# Patient Record
Sex: Female | Born: 1945 | Race: White | Hispanic: No | Smoking: Never smoker
Health system: Southern US, Community
[De-identification: ages and names within clinical notes are randomized; demographics above are authoritative.]

## PROBLEM LIST (undated history)

## (undated) DIAGNOSIS — C569 Malignant neoplasm of unspecified ovary: Secondary | ICD-10-CM

## (undated) DIAGNOSIS — F419 Anxiety disorder, unspecified: Secondary | ICD-10-CM

## (undated) DIAGNOSIS — T4145XA Adverse effect of unspecified anesthetic, initial encounter: Secondary | ICD-10-CM

## (undated) DIAGNOSIS — M25512 Pain in left shoulder: Secondary | ICD-10-CM

## (undated) DIAGNOSIS — I491 Atrial premature depolarization: Secondary | ICD-10-CM

## (undated) DIAGNOSIS — M810 Age-related osteoporosis without current pathological fracture: Secondary | ICD-10-CM

## (undated) DIAGNOSIS — N390 Urinary tract infection, site not specified: Secondary | ICD-10-CM

## (undated) DIAGNOSIS — A0471 Enterocolitis due to Clostridium difficile, recurrent: Secondary | ICD-10-CM

## (undated) DIAGNOSIS — L989 Disorder of the skin and subcutaneous tissue, unspecified: Secondary | ICD-10-CM

## (undated) DIAGNOSIS — T8859XA Other complications of anesthesia, initial encounter: Secondary | ICD-10-CM

## (undated) DIAGNOSIS — Z524 Kidney donor: Secondary | ICD-10-CM

## (undated) HISTORY — PX: ABDOMINAL HYSTERECTOMY: SHX81

## (undated) HISTORY — DX: Enterocolitis due to Clostridium difficile, recurrent: A04.71

## (undated) HISTORY — DX: Urinary tract infection, site not specified: N39.0

## (undated) HISTORY — PX: SHOULDER ARTHROSCOPY: SHX128

## (undated) HISTORY — PX: HYSTEROSCOPY: SHX211

## (undated) HISTORY — PX: BREAST SURGERY: SHX581

## (undated) HISTORY — PX: COLONOSCOPY: SHX174

## (undated) HISTORY — PX: CERVIX SURGERY: SHX593

## (undated) HISTORY — DX: Malignant neoplasm of unspecified ovary: C56.9

---

## 1998-01-31 HISTORY — PX: NEPHRECTOMY: SHX65

## 1998-02-18 ENCOUNTER — Other Ambulatory Visit: Admission: RE | Admit: 1998-02-18 | Discharge: 1998-02-18 | Payer: Self-pay | Admitting: Obstetrics and Gynecology

## 1999-08-17 ENCOUNTER — Encounter: Admission: RE | Admit: 1999-08-17 | Discharge: 1999-08-17 | Payer: Self-pay | Admitting: Obstetrics and Gynecology

## 1999-08-17 ENCOUNTER — Encounter: Payer: Self-pay | Admitting: Obstetrics and Gynecology

## 1999-09-21 ENCOUNTER — Other Ambulatory Visit: Admission: RE | Admit: 1999-09-21 | Discharge: 1999-09-21 | Payer: Self-pay | Admitting: Obstetrics and Gynecology

## 1999-09-21 ENCOUNTER — Encounter (INDEPENDENT_AMBULATORY_CARE_PROVIDER_SITE_OTHER): Payer: Self-pay

## 1999-11-09 ENCOUNTER — Inpatient Hospital Stay (HOSPITAL_COMMUNITY): Admission: AD | Admit: 1999-11-09 | Discharge: 1999-11-09 | Payer: Self-pay | Admitting: *Deleted

## 1999-11-10 ENCOUNTER — Ambulatory Visit (HOSPITAL_COMMUNITY): Admission: RE | Admit: 1999-11-10 | Discharge: 1999-11-10 | Payer: Self-pay | Admitting: Obstetrics and Gynecology

## 2001-09-27 ENCOUNTER — Ambulatory Visit (HOSPITAL_COMMUNITY): Admission: RE | Admit: 2001-09-27 | Discharge: 2001-09-27 | Payer: Self-pay | Admitting: Dermatology

## 2001-09-27 ENCOUNTER — Encounter: Payer: Self-pay | Admitting: Dermatology

## 2001-11-06 ENCOUNTER — Encounter: Payer: Self-pay | Admitting: Family Medicine

## 2001-11-06 ENCOUNTER — Encounter: Admission: RE | Admit: 2001-11-06 | Discharge: 2001-11-06 | Payer: Self-pay | Admitting: Family Medicine

## 2004-02-13 ENCOUNTER — Emergency Department (HOSPITAL_COMMUNITY): Admission: EM | Admit: 2004-02-13 | Discharge: 2004-02-13 | Payer: Self-pay | Admitting: Emergency Medicine

## 2004-07-23 ENCOUNTER — Ambulatory Visit (HOSPITAL_COMMUNITY): Admission: RE | Admit: 2004-07-23 | Discharge: 2004-07-23 | Payer: Self-pay | Admitting: Dermatology

## 2004-09-21 ENCOUNTER — Ambulatory Visit: Payer: Self-pay | Admitting: Family Medicine

## 2004-09-26 ENCOUNTER — Ambulatory Visit: Payer: Self-pay

## 2006-05-18 ENCOUNTER — Ambulatory Visit: Payer: Self-pay | Admitting: Family Medicine

## 2006-05-18 LAB — CONVERTED CEMR LAB
ALT: 14 units/L (ref 0–40)
Albumin: 4.2 g/dL (ref 3.5–5.2)
Alkaline Phosphatase: 58 units/L (ref 39–117)
Basophils Absolute: 0 10*3/uL (ref 0.0–0.1)
Basophils Relative: 0.2 % (ref 0.0–1.0)
CO2: 31 meq/L (ref 19–32)
Chol/HDL Ratio, serum: 2.3
Cholesterol: 178 mg/dL (ref 0–200)
GFR calc non Af Amer: 54 mL/min
Glomerular Filtration Rate, Af Am: 65 mL/min/{1.73_m2}
Glucose, Bld: 81 mg/dL (ref 70–99)
LDL Cholesterol: 92 mg/dL (ref 0–99)
Monocytes Relative: 7.2 % (ref 3.0–11.0)
Platelets: 290 10*3/uL (ref 150–400)
Potassium: 4 meq/L (ref 3.5–5.1)
RBC: 4.27 M/uL (ref 3.87–5.11)
Sodium: 142 meq/L (ref 135–145)
TSH: 1.62 microintl units/mL (ref 0.35–5.50)
Total Bilirubin: 1 mg/dL (ref 0.3–1.2)
Total Protein: 7.1 g/dL (ref 6.0–8.3)
VLDL: 10 mg/dL (ref 0–40)
WBC: 7.1 10*3/uL (ref 4.5–10.5)

## 2006-06-08 ENCOUNTER — Ambulatory Visit: Payer: Self-pay | Admitting: Internal Medicine

## 2008-12-04 ENCOUNTER — Telehealth (INDEPENDENT_AMBULATORY_CARE_PROVIDER_SITE_OTHER): Payer: Self-pay | Admitting: *Deleted

## 2008-12-04 ENCOUNTER — Ambulatory Visit: Payer: Self-pay | Admitting: Family Medicine

## 2008-12-04 DIAGNOSIS — M545 Low back pain: Secondary | ICD-10-CM

## 2008-12-07 ENCOUNTER — Ambulatory Visit (HOSPITAL_BASED_OUTPATIENT_CLINIC_OR_DEPARTMENT_OTHER): Admission: RE | Admit: 2008-12-07 | Discharge: 2008-12-07 | Payer: Self-pay | Admitting: Family Medicine

## 2008-12-07 ENCOUNTER — Ambulatory Visit: Payer: Self-pay | Admitting: Diagnostic Radiology

## 2008-12-08 ENCOUNTER — Telehealth (INDEPENDENT_AMBULATORY_CARE_PROVIDER_SITE_OTHER): Payer: Self-pay | Admitting: *Deleted

## 2010-11-18 NOTE — Op Note (Signed)
[facility]  Patient:    Colleen Mckinney, Colleen Mckinney                       MRN: [id number] Proc. Date: 11/10/99 Adm. Date:  [id number] Disc. Date: [id number] Attending:  [name]                           Operative Report  PREOPERATIVE DIAGNOSIS:       Persistent postmenopausal bleeding.  POSTOPERATIVE DIAGNOSIS:      Persistent postmenopausal bleeding.  OPERATION:                    Hysteroscopy with endometrial ablation.  SURGEON:                      Katherine Roan, M.D.  ASSISTANT:  ANESTHESIA:  ESTIMATED BLOOD LOSS:  DESCRIPTION OF PROCEDURE:     The patient was placed in the lithotomy position nd prepped and draped in the usual fashion.  The cervix was progressively dilated. A retroverted uterus was noted.  Pitressin solution was injected into the uterus n a retroverted fashion.  Hysteroscopy was performed which revealed a smooth endometrial cavity and using the rollerball vaportrobe at 200 watts of power, a  thorough endometrial ablation was obtained.  No unusual blood loss occurred during the procedure.  No specimens were taken because an endometrial biopsy had previously been done and was normal.  [name] tolerated the procedure well and was sent to the recovery room in good condition. DD:  11/10/99 TD:  11/11/99 Job: [id number]

## 2011-04-26 ENCOUNTER — Encounter: Payer: Self-pay | Admitting: *Deleted

## 2011-04-27 ENCOUNTER — Other Ambulatory Visit (INDEPENDENT_AMBULATORY_CARE_PROVIDER_SITE_OTHER): Payer: Medicare Other

## 2011-04-27 ENCOUNTER — Ambulatory Visit (INDEPENDENT_AMBULATORY_CARE_PROVIDER_SITE_OTHER): Payer: Self-pay | Admitting: Family Medicine

## 2011-04-27 ENCOUNTER — Other Ambulatory Visit: Payer: Self-pay | Admitting: Family Medicine

## 2011-04-27 DIAGNOSIS — Z0000 Encounter for general adult medical examination without abnormal findings: Secondary | ICD-10-CM

## 2011-04-27 DIAGNOSIS — Z79899 Other long term (current) drug therapy: Secondary | ICD-10-CM

## 2011-04-27 LAB — CBC WITH DIFFERENTIAL/PLATELET
Basophils Absolute: 0 10*3/uL (ref 0.0–0.1)
Basophils Relative: 0.7 % (ref 0.0–3.0)
Eosinophils Relative: 1.8 % (ref 0.0–5.0)
HCT: 37.7 % (ref 36.0–46.0)
Hemoglobin: 13 g/dL (ref 12.0–15.0)
Lymphocytes Relative: 20.7 % (ref 12.0–46.0)
Lymphs Abs: 1.5 10*3/uL (ref 0.7–4.0)
Monocytes Relative: 6.5 % (ref 3.0–12.0)
Neutro Abs: 5.1 10*3/uL (ref 1.4–7.7)
RBC: 4.12 Mil/uL (ref 3.87–5.11)
WBC: 7.2 10*3/uL (ref 4.5–10.5)

## 2011-04-27 LAB — BASIC METABOLIC PANEL
BUN: 16 mg/dL (ref 6–23)
Chloride: 104 mEq/L (ref 96–112)
Creatinine, Ser: 1 mg/dL (ref 0.4–1.2)
GFR: 57 mL/min — ABNORMAL LOW (ref >60.00)
Potassium: 4 mEq/L (ref 3.5–5.1)

## 2011-04-27 LAB — HEPATIC FUNCTION PANEL
ALT: 16 U/L (ref 0–35)
AST: 23 U/L (ref 0–37)
Albumin: 4.5 g/dL (ref 3.5–5.2)
Alkaline Phosphatase: 66 U/L (ref 39–117)
Total Protein: 7.5 g/dL (ref 6.0–8.3)

## 2011-04-27 LAB — POCT URINALYSIS DIPSTICK
Leukocytes, UA: NEGATIVE
Nitrite, UA: NEGATIVE
Protein, UA: NEGATIVE
pH, UA: 5

## 2011-04-27 LAB — LIPID PANEL
Cholesterol: 180 mg/dL (ref 0–200)
VLDL: 11 mg/dL (ref 0.0–40.0)

## 2011-04-27 NOTE — Progress Notes (Signed)
  Subjective:    Patient ID: Colleen Mckinney, female    DOB: May 19, 1946, 65 y.o.   MRN: [id number]  HPI  Ov re scheduled    Review of Systems     Objective:   Physical Exam        Assessment & Plan:

## 2011-04-27 NOTE — Progress Notes (Signed)
Labs only

## 2011-05-02 ENCOUNTER — Ambulatory Visit: Payer: Medicare Other | Admitting: Family Medicine

## 2011-05-17 ENCOUNTER — Other Ambulatory Visit: Payer: Self-pay | Admitting: Specialist

## 2011-05-17 DIAGNOSIS — M25512 Pain in left shoulder: Secondary | ICD-10-CM

## 2011-05-18 ENCOUNTER — Ambulatory Visit
Admission: RE | Admit: 2011-05-18 | Discharge: 2011-05-18 | Disposition: A | Discharge status: Home / Self Care | Payer: Medicare Other | Source: Ambulatory Visit | Attending: Specialist | Admitting: Specialist

## 2011-05-18 DIAGNOSIS — M25512 Pain in left shoulder: Secondary | ICD-10-CM

## 2011-05-23 ENCOUNTER — Ambulatory Visit: Payer: Medicare Other | Attending: Specialist | Admitting: Physical Therapy

## 2011-05-23 ENCOUNTER — Ambulatory Visit (INDEPENDENT_AMBULATORY_CARE_PROVIDER_SITE_OTHER): Payer: Medicare Other | Admitting: Family Medicine

## 2011-05-23 ENCOUNTER — Encounter: Payer: Self-pay | Admitting: Family Medicine

## 2011-05-23 DIAGNOSIS — R079 Chest pain, unspecified: Secondary | ICD-10-CM

## 2011-05-23 DIAGNOSIS — M25619 Stiffness of unspecified shoulder, not elsewhere classified: Secondary | ICD-10-CM | POA: Insufficient documentation

## 2011-05-23 DIAGNOSIS — Z136 Encounter for screening for cardiovascular disorders: Secondary | ICD-10-CM

## 2011-05-23 DIAGNOSIS — Z23 Encounter for immunization: Secondary | ICD-10-CM

## 2011-05-23 DIAGNOSIS — M25519 Pain in unspecified shoulder: Secondary | ICD-10-CM | POA: Insufficient documentation

## 2011-05-23 DIAGNOSIS — I728 Aneurysm of other specified arteries: Secondary | ICD-10-CM

## 2011-05-23 DIAGNOSIS — Z0000 Encounter for general adult medical examination without abnormal findings: Secondary | ICD-10-CM

## 2011-05-23 DIAGNOSIS — IMO0001 Reserved for inherently not codable concepts without codable children: Secondary | ICD-10-CM | POA: Insufficient documentation

## 2011-05-23 DIAGNOSIS — R1011 Right upper quadrant pain: Secondary | ICD-10-CM

## 2011-05-23 NOTE — Progress Notes (Signed)
Subjective:     Colleen Mckinney is a 65 y.o. female and is here for a comprehensive physical exam. The patient reports problems - frozen shoulder---pt is seeing ortho for this..  History   Social History  . Marital Status: Divorced    Spouse Name: N/A    Number of Children: N/A  . Years of Education: N/A   Occupational History  . retired-- Printmaker    Social History Main Topics  . Smoking status: Never Smoker   . Smokeless tobacco: Not on file  . Alcohol Use: 1.8 oz/week    3 Glasses of wine per week  . Drug Use: No  . Sexually Active: Not Currently -- Female partner(s)   Other Topics Concern  . Not on file   Social History Narrative  . No narrative on file   Health Maintenance  Topic Date Due  . Tetanus/tdap  06/09/1964  . Pap Smear  08/23/2011  . Influenza Vaccine  04/02/2012  . Colonoscopy  05/22/2012  . Mammogram  08/22/2012  . Pneumococcal Polysaccharide Vaccine Age 16 And Over  Addressed  . Zostavax  Completed    The following portions of the patient's history were reviewed and updated as appropriate: allergies, current medications, past family history, past medical history, past social history, past surgical history and problem list.  Review of Systems Review of Systems  Constitutional: Negative for activity change, appetite change and fatigue.  HENT: Negative for hearing loss, congestion, tinnitus and ear discharge.  dentist q59m Eyes: Negative for visual disturbance (see optho q1y -- vision corrected to 20/20 with glasses).  Respiratory: Negative for cough, chest tightness and shortness of breath.   Cardiovascular: Negative for, palpitations and leg swelling.  + chest pain and l neck pain Gastrointestinal: Negative for abdominal pain, diarrhea, constipation and abdominal distention.  Genitourinary: Negative for urgency, frequency, decreased urine volume and difficulty urinating.  Musculoskeletal: Negative for back pain, and gait problem. --+ L shoulder  pain  Skin: Negative for color change, pallor and rash.  Neurological: Negative for dizziness, light-headedness, numbness and headaches.  Hematological: Negative for adenopathy. Does not bruise/bleed easily.  Psychiatric/Behavioral: Negative for suicidal ideas, confusion, sleep disturbance, self-injury, dysphoric mood, decreased concentration and agitation.       Objective:    BP 100/76  Pulse 69  Temp(Src) 98.6 F (37 C) (Oral)  Resp 20  Ht 5' 3.5" (1.613 m)  Wt 114 lb (51.71 kg)  BMI 19.88 kg/m2  SpO2 98% General appearance: alert, cooperative, appears stated age and no distress Head: Normocephalic, without obvious abnormality, atraumatic Eyes: conjunctivae/corneas clear. PERRL, EOM's intact. Fundi benign. Ears: normal TM's and external ear canals both ears Nose: Nares normal. Septum midline. Mucosa normal. No drainage or sinus tenderness. Throat: lips, mucosa, and tongue normal; teeth and gums normal Neck: no adenopathy, no carotid bruit, no JVD, supple, symmetrical, trachea midline and thyroid not enlarged, symmetric, no tenderness/mass/nodules Back: symmetric, no curvature. ROM normal. No CVA tenderness. Lungs: clear to auscultation bilaterally Breasts: gyn Heart: regular rate and rhythm, S1, S2 normal, no murmur, click, rub or gallop Abdomen: soft, non-tender; bowel sounds normal; no masses,  no organomegaly Pelvic: gyn Extremities: extremities normal, atraumatic, no cyanosis or edema--pain L shoulder --  Dec rom Pulses: 2+ and symmetric Skin: Skin color, texture, turgor normal. No rashes or lesions Lymph nodes: Cervical, supraclavicular, and axillary nodes normal. Neurologic: Alert and oriented X 3, normal strength and tone. Normal symmetric reflexes. Normal coordination and gait Psych--no depression and anxiety  Assessment:    Healthy female exam.     frozen L shoulder---f/u ortho L neck / chest pain----  Probably from shoulder----if occurs again go to ER or f/u  here Plan:  ghm utd Check labs   See After Visit Summary for Counseling Recommendations

## 2011-05-23 NOTE — Patient Instructions (Signed)

## 2011-05-24 ENCOUNTER — Other Ambulatory Visit: Payer: Self-pay | Admitting: Family Medicine

## 2011-05-24 DIAGNOSIS — I728 Aneurysm of other specified arteries: Secondary | ICD-10-CM

## 2011-05-24 DIAGNOSIS — R1011 Right upper quadrant pain: Secondary | ICD-10-CM

## 2011-05-24 NOTE — Progress Notes (Signed)
Addended by: Arnette Norris on: 05/24/2011 02:52 PM   Modules accepted: Orders

## 2011-05-29 ENCOUNTER — Other Ambulatory Visit: Payer: Self-pay | Admitting: Family Medicine

## 2011-05-29 ENCOUNTER — Ambulatory Visit: Payer: Medicare Other | Admitting: Physical Therapy

## 2011-05-29 ENCOUNTER — Other Ambulatory Visit: Payer: Self-pay

## 2011-05-29 DIAGNOSIS — I728 Aneurysm of other specified arteries: Secondary | ICD-10-CM

## 2011-05-29 DIAGNOSIS — R1011 Right upper quadrant pain: Secondary | ICD-10-CM

## 2011-05-30 ENCOUNTER — Other Ambulatory Visit: Payer: Medicare Other

## 2011-05-30 ENCOUNTER — Ambulatory Visit (HOSPITAL_BASED_OUTPATIENT_CLINIC_OR_DEPARTMENT_OTHER)
Admission: RE | Admit: 2011-05-30 | Discharge: 2011-05-30 | Disposition: A | Discharge status: Home / Self Care | Payer: Medicare Other | Source: Ambulatory Visit | Attending: Family Medicine | Admitting: Family Medicine

## 2011-05-30 DIAGNOSIS — R1011 Right upper quadrant pain: Secondary | ICD-10-CM | POA: Insufficient documentation

## 2011-05-30 DIAGNOSIS — Z905 Acquired absence of kidney: Secondary | ICD-10-CM | POA: Insufficient documentation

## 2011-05-30 DIAGNOSIS — I728 Aneurysm of other specified arteries: Secondary | ICD-10-CM | POA: Insufficient documentation

## 2011-05-30 MED ORDER — IOHEXOL 350 MG/ML SOLN
100.0000 mL | Freq: Once | INTRAVENOUS | Status: AC | PRN
  Administered 2011-05-30: 100 mL via INTRAVENOUS

## 2011-06-01 ENCOUNTER — Ambulatory Visit: Payer: Medicare Other | Admitting: Physical Therapy

## 2011-06-05 ENCOUNTER — Ambulatory Visit: Payer: Medicare Other | Attending: Specialist | Admitting: Physical Therapy

## 2011-06-05 DIAGNOSIS — M25619 Stiffness of unspecified shoulder, not elsewhere classified: Secondary | ICD-10-CM | POA: Insufficient documentation

## 2011-06-05 DIAGNOSIS — IMO0001 Reserved for inherently not codable concepts without codable children: Secondary | ICD-10-CM | POA: Insufficient documentation

## 2011-06-05 DIAGNOSIS — M25519 Pain in unspecified shoulder: Secondary | ICD-10-CM | POA: Insufficient documentation

## 2011-06-07 ENCOUNTER — Ambulatory Visit: Payer: Medicare Other | Admitting: Physical Therapy

## 2011-06-14 ENCOUNTER — Ambulatory Visit: Payer: Medicare Other | Admitting: Physical Therapy

## 2011-06-22 ENCOUNTER — Telehealth: Payer: Self-pay | Admitting: Family Medicine

## 2011-06-23 NOTE — Telephone Encounter (Signed)
Patient stated "nothing acute" does not help her, she wanted to know if the aneurysm was gone and wanted to know if she had gallstones. Please advise    KP

## 2011-06-23 NOTE — Telephone Encounter (Signed)
No gallstones.  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  They either remain stable or enlarge.  Based on report they feel aneurysm is stable and not at risk of rupture.  Again, will need to wait on Dr Laury Axon to make recommendations

## 2011-06-23 NOTE — Telephone Encounter (Signed)
Discussed with patient and she voiced understanding. She has been advised to follow up as needed.   KP

## 2011-07-11 ENCOUNTER — Telehealth: Payer: Self-pay | Admitting: Family Medicine

## 2011-07-11 NOTE — Telephone Encounter (Signed)
Discussed with patient and se stated she is being charged 50 for the EKG and 10 for the Office Visit. She sated the insurance company will not pay for it because it is not coded as a physical exam. I advised patient the code that was used id the CPE code, she wanted someone to look further into it and give her a call back, the bill is due on the 15th of this month.     KP

## 2011-07-11 NOTE — Telephone Encounter (Signed)
It was a physical---that's how I coded it

## 2011-07-11 NOTE — Telephone Encounter (Signed)
Patient states that she came in 05/23/11 for a physical. She states that her insurance applied visit to deductible stating that her visit was an OV instead of a physical. She wants to know if we can code the visit differently?

## 2011-07-11 NOTE — Telephone Encounter (Signed)
Please advise      KP 

## 2011-07-12 NOTE — Telephone Encounter (Signed)
Colleen Mckinney will it be possible to take care of this because the patient does not want to have to pay this bill. Please advise   Kp

## 2011-07-12 NOTE — Telephone Encounter (Signed)
It may be that cpe was not the code used for the EKG---that can be changed but Colleen Mckinney will need to talk to billing

## 2011-07-18 NOTE — Telephone Encounter (Signed)
Sent to [employer] in [place] billing to advise and make corrections.  Will call patient and advise as well.

## 2011-07-31 ENCOUNTER — Other Ambulatory Visit: Payer: Self-pay | Admitting: Otolaryngology

## 2011-08-11 ENCOUNTER — Encounter (HOSPITAL_COMMUNITY): Payer: Self-pay | Admitting: Pharmacy Technician

## 2011-08-14 ENCOUNTER — Encounter (HOSPITAL_COMMUNITY)
Admission: RE | Admit: 2011-08-14 | Discharge: 2011-08-14 | Disposition: A | Discharge status: Home / Self Care | Payer: Medicare Other | Source: Ambulatory Visit | Attending: Specialist | Admitting: Specialist

## 2011-08-14 ENCOUNTER — Encounter (HOSPITAL_COMMUNITY): Payer: Self-pay

## 2011-08-14 HISTORY — DX: Other complications of anesthesia, initial encounter: T88.59XA

## 2011-08-14 HISTORY — DX: Kidney donor: Z52.4

## 2011-08-14 HISTORY — DX: Pain in left shoulder: M25.512

## 2011-08-14 HISTORY — DX: Adverse effect of unspecified anesthetic, initial encounter: T41.45XA

## 2011-08-14 HISTORY — DX: Anxiety disorder, unspecified: F41.9

## 2011-08-14 LAB — CBC
Hemoglobin: 14.1 g/dL (ref 12.0–15.0)
MCH: 30.9 pg (ref 26.0–34.0)
MCHC: 35.3 g/dL (ref 30.0–36.0)
MCV: 87.3 fL (ref 78.0–100.0)
RBC: 4.57 MIL/uL (ref 3.87–5.11)

## 2011-08-14 LAB — BASIC METABOLIC PANEL
BUN: 19 mg/dL (ref 6–23)
CO2: 30 mEq/L (ref 19–32)
Calcium: 10.3 mg/dL (ref 8.4–10.5)
Creatinine, Ser: 0.97 mg/dL (ref 0.50–1.10)
Glucose, Bld: 92 mg/dL (ref 70–99)

## 2011-08-14 NOTE — Pre-Procedure Instructions (Signed)
CXR AND EKG NOT NEEDED PER ANESTHESIOLOGIST'S GUIDELINES--PT DID HAVE EKG DONE 05/23/11 BY DR. Marylene Buerger IN EPIC AND ON THIS CHART. CBC AND BMET WERE DONE TODAY-PREOP AT [facility].

## 2011-08-14 NOTE — Patient Instructions (Signed)
20 Colleen Mckinney  08/14/2011   Your procedure is scheduled on:  *THURSDAY 2/14  AT 7:30 AM  Report to [facility] at 5:30 AM.  Call this number if you have problems the morning of surgery: [phone]   Remember:   Do not eat food OR DRINK ANYTHING AFTER MIDNIGHT THE NIGHT BEFORE YOUR SURGERY    Take these medicines the morning of surgery with A SIP OF WATER: NO MEDICINES TO TAKE   Do not wear jewelry, make-up or nail polish.  Do not wear lotions, powders, or perfumes.   Do not shave 48 hours prior to surgery.  Do not bring valuables to the hospital.  Contacts, dentures or bridgework may not be worn into surgery.  Leave suitcase in the car. After surgery it may be brought to your room.  For patients admitted to the hospital, checkout time is 11:00 AM the day of discharge.   Patients discharged the day of surgery will not be allowed to drive home.    Special Instructions: CHG Shower Use Special Wash: 1/2 bottle night before surgery and 1/2 bottle morning of surgery.   Please read over the following fact sheets that you were given: MRSA Information AND INCENTIVE SPIROMETER INFORMATION

## 2011-08-17 ENCOUNTER — Encounter (HOSPITAL_COMMUNITY): Payer: Self-pay | Admitting: Anesthesiology

## 2011-08-17 ENCOUNTER — Encounter (HOSPITAL_COMMUNITY): Payer: Self-pay

## 2011-08-17 ENCOUNTER — Encounter (HOSPITAL_COMMUNITY)
Admission: RE | Disposition: A | Discharge status: Home / Self Care | Payer: Self-pay | Source: Ambulatory Visit | Attending: Specialist

## 2011-08-17 ENCOUNTER — Ambulatory Visit (HOSPITAL_COMMUNITY)
Admission: RE | Admit: 2011-08-17 | Discharge: 2011-08-17 | Disposition: A | Discharge status: Home / Self Care | Payer: Medicare Other | Source: Ambulatory Visit | Attending: Specialist | Admitting: Specialist

## 2011-08-17 ENCOUNTER — Inpatient Hospital Stay (HOSPITAL_COMMUNITY): Payer: Medicare Other | Admitting: Anesthesiology

## 2011-08-17 DIAGNOSIS — M7500 Adhesive capsulitis of unspecified shoulder: Secondary | ICD-10-CM | POA: Insufficient documentation

## 2011-08-17 DIAGNOSIS — M719 Bursopathy, unspecified: Secondary | ICD-10-CM | POA: Insufficient documentation

## 2011-08-17 DIAGNOSIS — M24119 Other articular cartilage disorders, unspecified shoulder: Secondary | ICD-10-CM | POA: Insufficient documentation

## 2011-08-17 DIAGNOSIS — Z905 Acquired absence of kidney: Secondary | ICD-10-CM | POA: Insufficient documentation

## 2011-08-17 DIAGNOSIS — Z01812 Encounter for preprocedural laboratory examination: Secondary | ICD-10-CM | POA: Insufficient documentation

## 2011-08-17 DIAGNOSIS — M67919 Unspecified disorder of synovium and tendon, unspecified shoulder: Secondary | ICD-10-CM | POA: Insufficient documentation

## 2011-08-17 DIAGNOSIS — M25819 Other specified joint disorders, unspecified shoulder: Secondary | ICD-10-CM | POA: Insufficient documentation

## 2011-08-17 DIAGNOSIS — M7540 Impingement syndrome of unspecified shoulder: Secondary | ICD-10-CM

## 2011-08-17 SURGERY — SHOULDER ARTHROSCOPY WITH ROTATOR CUFF REPAIR AND SUBACROMIAL DECOMPRESSION
Anesthesia: General | Site: Shoulder | Laterality: Left | Wound class: Clean

## 2011-08-17 MED ORDER — HYDROCODONE-ACETAMINOPHEN 5-325 MG PO TABS: 1.0000 | ORAL_TABLET | Freq: Four times a day (QID) | ORAL | Status: AC | PRN

## 2011-08-17 MED ORDER — PHENYLEPHRINE HCL 10 MG/ML IJ SOLN
INTRAMUSCULAR | Status: DC | PRN
  Administered 2011-08-17: 80 ug via INTRAVENOUS
  Administered 2011-08-17: 120 ug via INTRAVENOUS
  Administered 2011-08-17 (×2): 80 ug via INTRAVENOUS
  Administered 2011-08-17: 120 ug via INTRAVENOUS

## 2011-08-17 MED ORDER — PROPOFOL 10 MG/ML IV EMUL
INTRAVENOUS | Status: DC | PRN
  Administered 2011-08-17: 120 mg via INTRAVENOUS

## 2011-08-17 MED ORDER — ONDANSETRON HCL 4 MG/2ML IJ SOLN
INTRAMUSCULAR | Status: DC | PRN
  Administered 2011-08-17: 4 mg via INTRAVENOUS

## 2011-08-17 MED ORDER — EPINEPHRINE HCL 1 MG/ML IJ SOLN
INTRAMUSCULAR | Status: DC | PRN
  Administered 2011-08-17: 1 mg via INTRAVENOUS

## 2011-08-17 MED ORDER — LACTATED RINGERS IV SOLN
INTRAVENOUS | Status: DC
  Administered 2011-08-17 (×2): via INTRAVENOUS

## 2011-08-17 MED ORDER — KETOROLAC TROMETHAMINE 15 MG/ML IJ SOLN: 15.0000 mg | Freq: Four times a day (QID) | INTRAMUSCULAR | Status: DC | PRN

## 2011-08-17 MED ORDER — LACTATED RINGERS IR SOLN
Status: DC | PRN
  Administered 2011-08-17: 3000 mL

## 2011-08-17 MED ORDER — PROMETHAZINE HCL 25 MG/ML IJ SOLN: 6.2500 mg | INTRAMUSCULAR | Status: DC | PRN

## 2011-08-17 MED ORDER — EPHEDRINE SULFATE 50 MG/ML IJ SOLN
INTRAMUSCULAR | Status: DC | PRN
  Administered 2011-08-17: 10 mg via INTRAVENOUS

## 2011-08-17 MED ORDER — HYDROCODONE-ACETAMINOPHEN 5-325 MG PO TABS: 1.0000 | ORAL_TABLET | ORAL | Status: DC | PRN

## 2011-08-17 MED ORDER — LIDOCAINE HCL (CARDIAC) 20 MG/ML IV SOLN
INTRAVENOUS | Status: DC | PRN
  Administered 2011-08-17: 80 mg via INTRAVENOUS

## 2011-08-17 MED ORDER — CHLORHEXIDINE GLUCONATE 4 % EX LIQD: 60.0000 mL | Freq: Once | CUTANEOUS | Status: DC

## 2011-08-17 MED ORDER — METHOCARBAMOL 500 MG PO TABS: 500.0000 mg | ORAL_TABLET | Freq: Four times a day (QID) | ORAL | Status: DC | PRN

## 2011-08-17 MED ORDER — BUPIVACAINE-EPINEPHRINE 0.5% -1:200000 IJ SOLN
INTRAMUSCULAR | Status: DC | PRN
  Administered 2011-08-17: 30 mL

## 2011-08-17 MED ORDER — METHOCARBAMOL 500 MG PO TABS: 500.0000 mg | ORAL_TABLET | Freq: Four times a day (QID) | ORAL | Status: AC | PRN

## 2011-08-17 MED ORDER — CEFAZOLIN SODIUM 1-5 GM-% IV SOLN
1.0000 g | INTRAVENOUS | Status: AC
  Administered 2011-08-17: 1 g via INTRAVENOUS

## 2011-08-17 MED ORDER — MIDAZOLAM HCL 5 MG/5ML IJ SOLN
INTRAMUSCULAR | Status: DC | PRN
  Administered 2011-08-17: 2 mg via INTRAVENOUS

## 2011-08-17 MED ORDER — ROPIVACAINE HCL 5 MG/ML IJ SOLN
INTRAMUSCULAR | Status: DC | PRN
  Administered 2011-08-17: 30 mL

## 2011-08-17 MED ORDER — FENTANYL CITRATE 0.05 MG/ML IJ SOLN
INTRAMUSCULAR | Status: DC | PRN
  Administered 2011-08-17: 100 ug via INTRAVENOUS
  Administered 2011-08-17: 50 ug via INTRAVENOUS

## 2011-08-17 MED ORDER — ROCURONIUM BROMIDE 100 MG/10ML IV SOLN
INTRAVENOUS | Status: DC | PRN
  Administered 2011-08-17: 50 mg via INTRAVENOUS

## 2011-08-17 MED ORDER — NEOSTIGMINE METHYLSULFATE 1 MG/ML IJ SOLN
INTRAMUSCULAR | Status: DC | PRN
  Administered 2011-08-17: 3 mg via INTRAVENOUS

## 2011-08-17 MED ORDER — ACETAMINOPHEN 10 MG/ML IV SOLN
INTRAVENOUS | Status: DC | PRN
  Administered 2011-08-17: 1000 mg via INTRAVENOUS

## 2011-08-17 MED ORDER — HYDROMORPHONE HCL PF 1 MG/ML IJ SOLN: 0.2500 mg | INTRAMUSCULAR | Status: DC | PRN

## 2011-08-17 MED ORDER — GLYCOPYRROLATE 0.2 MG/ML IJ SOLN
INTRAMUSCULAR | Status: DC | PRN
  Administered 2011-08-17: .4 mg via INTRAVENOUS

## 2011-08-17 SURGICAL SUPPLY — 40 items
BLADE CUTTER GATOR 3.5 (BLADE) ×2 IMPLANT
BLADE SURG SZ11 CARB STEEL (BLADE) ×2 IMPLANT
BUR OVAL 4.0 (BURR) ×2 IMPLANT
CANNULA ACUFO 5X76 (CANNULA) ×2 IMPLANT
CHLORAPREP W/TINT 26ML (MISCELLANEOUS) IMPLANT
CLOTH BEACON ORANGE TIMEOUT ST (SAFETY) ×2 IMPLANT
DECANTER SPIKE VIAL GLASS SM (MISCELLANEOUS) ×2 IMPLANT
DRAPE ORTHO SPLIT 77X108 STRL (DRAPES)
DRAPE SURG ORHT 6 SPLT 77X108 (DRAPES) IMPLANT
DRAPE U-SHAPE 47X51 STRL (DRAPES) ×2 IMPLANT
DRSG EMULSION OIL 3X3 NADH (GAUZE/BANDAGES/DRESSINGS) ×2 IMPLANT
DRSG PAD ABDOMINAL 8X10 ST (GAUZE/BANDAGES/DRESSINGS) ×1 IMPLANT
DURAPREP 26ML APPLICATOR (WOUND CARE) ×2 IMPLANT
GAUZE SPONGE 4X4 12PLY STRL LF (GAUZE/BANDAGES/DRESSINGS) ×1 IMPLANT
GLOVE BIO SURGEON STRL SZ 6.5 (GLOVE) ×2 IMPLANT
GLOVE BIOGEL PI IND STRL 6.5 (GLOVE) ×1 IMPLANT
GLOVE BIOGEL PI IND STRL 8 (GLOVE) ×1 IMPLANT
GLOVE BIOGEL PI INDICATOR 6.5 (GLOVE) ×1
GLOVE BIOGEL PI INDICATOR 8 (GLOVE) ×1
GLOVE ECLIPSE 6.5 STRL STRAW (GLOVE) ×2 IMPLANT
GLOVE SURG SS PI 8.0 STRL IVOR (GLOVE) ×4 IMPLANT
GOWN PREVENTION PLUS LG XLONG (DISPOSABLE) ×2 IMPLANT
GOWN PREVENTION PLUS XLARGE (GOWN DISPOSABLE) ×2 IMPLANT
GOWN STRL REIN XL XLG (GOWN DISPOSABLE) ×2 IMPLANT
KIT BASIN OR (CUSTOM PROCEDURE TRAY) ×2 IMPLANT
MANIFOLD NEPTUNE II (INSTRUMENTS) ×4 IMPLANT
NEEDLE SPNL 18GX3.5 QUINCKE PK (NEEDLE) ×2 IMPLANT
PACK SHOULDER CUSTOM OPM052 (CUSTOM PROCEDURE TRAY) ×2 IMPLANT
POSITIONER SURGICAL ARM (MISCELLANEOUS) ×2 IMPLANT
SET ARTHROSCOPY TUBING (MISCELLANEOUS) ×2
SET ARTHROSCOPY TUBING LN (MISCELLANEOUS) ×1 IMPLANT
SLING ARM IMMOBILIZER LRG (SOFTGOODS) IMPLANT
SLING ARM IMMOBILIZER MED (SOFTGOODS) ×2 IMPLANT
SLING ULTRA II S (ORTHOPEDIC SUPPLIES) ×1 IMPLANT
STRAP CHIN BCCS-OSI (MISCELLANEOUS) ×2 IMPLANT
SUT ETHILON 4 0 PS 2 18 (SUTURE) ×2 IMPLANT
SUT VIC AB 2-0 CT2 27 (SUTURE) IMPLANT
SUT VICRYL 0-0 OS 2 NEEDLE (SUTURE) ×2 IMPLANT
TAPE HYPAFIX 4 X10 (GAUZE/BANDAGES/DRESSINGS) ×1 IMPLANT
TUBING CONNECTING 10 (TUBING) ×2 IMPLANT

## 2011-08-17 NOTE — Anesthesia Preprocedure Evaluation (Addendum)
Anesthesia Evaluation  Patient identified by MRN, date of birth, ID band Patient awake    Reviewed: Allergy & Precautions, H&P , NPO status , Patient's Chart, lab work & pertinent test results  Airway Mallampati: II TM Distance: >3 FB Neck ROM: Full    Dental No notable dental hx.    Pulmonary neg pulmonary ROS,  clear to auscultation  Pulmonary exam normal       Cardiovascular neg cardio ROS Regular Normal    Neuro/Psych Anxiety Negative Neurological ROS     GI/Hepatic negative GI ROS, Neg liver ROS,   Endo/Other  Negative Endocrine ROS  Renal/GU negative Renal ROS  Genitourinary negative   Musculoskeletal negative musculoskeletal ROS (+)   Abdominal   Peds negative pediatric ROS (+)  Hematology negative hematology ROS (+)   Anesthesia Other Findings   Reproductive/Obstetrics negative OB ROS                           Anesthesia Physical Anesthesia Plan  ASA: II  Anesthesia Plan: General   Post-op Pain Management:    Induction: Intravenous  Airway Management Planned: Oral ETT  Additional Equipment:   Intra-op Plan:   Post-operative Plan: Extubation in OR  Informed Consent: I have reviewed the patients History and Physical, chart, labs and discussed the procedure including the risks, benefits and alternatives for the proposed anesthesia with the patient or authorized representative who has indicated his/her understanding and acceptance.   Dental advisory given  Plan Discussed with: CRNA  Anesthesia Plan Comments: (Discussed risks and benefits of interscalene block including failure, bleeding, infection, nerve damage, weakness. Questions answered. Patient consents to block. )       Anesthesia Quick Evaluation

## 2011-08-17 NOTE — Anesthesia Procedure Notes (Signed)
Anesthesia Regional Block:  Interscalene brachial plexus block  Pre-Anesthetic Checklist: ,, timeout performed, Correct Patient, Correct Site, Correct Laterality, Correct Procedure, Correct Position, site marked, Risks and benefits discussed,  Surgical consent,  Pre-op evaluation,  At surgeon's request and post-op pain management  Laterality: Left  Prep: chloraprep       Needles:  Injection technique: Single-shot  Needle Type: Stimiplex      Needle Gauge: 21 G    Additional Needles:  Procedures: ultrasound guided and nerve stimulator Interscalene brachial plexus block  Nerve Stimulator or Paresthesia:  Response: 0.5 mA,   Additional Responses:   Narrative:  Injection made incrementally with aspirations every 5 mL.  Performed by: Personally  Anesthesiologist: Chesni Vos  Additional Notes: Risks, benefits and alternative to block explained extensively.  Patient tolerated procedure well, without complications. No pain on injection. No increased resistance to injection.  Motor intact immediately after block. Loss of deltoid function at 20 minutes.     Supraclavicular block

## 2011-08-17 NOTE — Progress Notes (Signed)
Patient ambulated with assist to bathroom. Tolerated well. No pain. Reviewed d/c instructions with patient and husband. Verbalized understanding. Home with sling and ice pack.

## 2011-08-17 NOTE — Brief Op Note (Signed)
08/17/2011  8:31 AM  PATIENT:  Colleen Mckinney  66 y.o. female  PRE-OPERATIVE DIAGNOSIS:  impingement syndrome, frozen shoulder left  POST-OPERATIVE DIAGNOSIS:  * No post-op diagnosis entered *  PROCEDURE:  Procedure(s) (LRB): SHOULDER ARTHROSCOPY WITHAND SUBACROMIAL DECOMPRESSION (Left) EXAM UNDER ANESTHESIA WITH MANIPULATION OF SHOULDER (Left)  SURGEON:  Surgeon(s) and Role:    * Javier Docker, MD - Primary  PHYSICIAN ASSISTANT:   ASSISTANTS: Strader   ANESTHESIA:   general  EBL:     BLOOD ADMINISTERED:none  DRAINS: none   LOCAL MEDICATIONS USED:  MARCAINE     SPECIMEN:  No Specimen  DISPOSITION OF SPECIMEN:  N/A  COUNTS:  YES  TOURNIQUET:  * No tourniquets in log *  DICTATION: .Other Dictation: Dictation Number  [id number]    PLAN OF CARE: Discharge to home after PACU  PATIENT DISPOSITION:  PACU - hemodynamically stable.   Delay start of Pharmacological VTE agent (>24hrs) due to surgical blood loss or risk of bleeding: yes

## 2011-08-17 NOTE — H&P (Signed)
Colleen Mckinney is an 66 y.o. female.   Chief Complaint: left shoulder pain HPI: frozen shoulder left.  Past Medical History  Diagnosis Date  . 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 Left shoulder pain   . Anxiety     claustrophobia, fear of heights  . Complication of anesthesia     remembers severe sore throat after kidney surgery and feeling dizzy    Past Surgical History  Procedure Date  . 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 Breast surgery     lumpectomy-benign  . Hysteroscopy     Family History  Problem Relation Age of Onset  . Heart disease Mother     mitral valve prolapse  . Hypertension Mother   . Hyperlipidemia Mother   . Cancer Mother 4    breast  . Osteopenia Mother   . Heart disease Father     bypass  . Hypertension Father   . Hyperlipidemia Father   . Cancer Father 32    bladder,  prostate  . Hyperlipidemia Sister    Social History:  reports that she has never smoked. She has never used smokeless tobacco. She reports that she drinks about 1.8 ounces of alcohol per week. She reports that she does not use illicit drugs.  Allergies: No Known Allergies  Medications Prior to Admission  Medication Dose Route Frequency Provider Last Rate Last Dose  . ceFAZolin (ANCEF) IVPB 1 g/50 mL premix  1 g Intravenous 60 min Pre-Op Liam Graham, PA      . chlorhexidine (HIBICLENS) 4 % liquid 4 application  60 mL Topical Once Liam Graham, PA      . lactated ringers infusion   Intravenous Continuous Liam Graham, PA       Medications Prior to Admission  Medication Sig Dispense Refill  . Calcium Carbonate-Vitamin D (CALCIUM + D PO) Take 1 tablet by mouth 2 (two) times daily.         No results found for this or any previous visit (from the past 48 hour(s)). No results found.  Review of Systems  Musculoskeletal: Positive for joint pain.  All other systems reviewed and are negative.    Blood pressure 156/94, pulse 85,  temperature 97 F (36.1 C), temperature source Oral, resp. rate 16, SpO2 100.00%. Physical Exam  Vitals reviewed. Constitutional: She appears well-developed.  HENT:  Head: Normocephalic.  Eyes: Pupils are equal, round, and reactive to light.  Neck: Normal range of motion.  Respiratory: Effort normal.  GI: Soft.  Musculoskeletal: Normal range of motion.  Neurological: She is alert.  Skin: Skin is warm.  Psychiatric: She has a normal mood and affect.     Assessment/Plan Impingement syndrome frozen shoulder left. Plan LSA SAD MUA EUA> Risks discussed  Milley Vining C 08/17/2011, 7:21 AM

## 2011-08-17 NOTE — Anesthesia Postprocedure Evaluation (Signed)
  Anesthesia Post-op Note  Patient: Colleen Mckinney  Procedure(s) Performed: Procedure(s) (LRB): SHOULDER ARTHROSCOPY WITH ROTATOR CUFF REPAIR AND SUBACROMIAL DECOMPRESSION (Left) EXAM UNDER ANESTHESIA WITH MANIPULATION OF SHOULDER (Left)  Patient Location: PACU  Anesthesia Type: GA combined with regional for post-op pain  Level of Consciousness: awake and alert   Airway and Oxygen Therapy: Patient Spontanous Breathing  Post-op Pain: mild  Post-op Assessment: Post-op Vital signs reviewed, Patient's Cardiovascular Status Stable, Respiratory Function Stable, Patent Airway and No signs of Nausea or vomiting  Post-op Vital Signs: stable  Complications: No apparent anesthesia complications

## 2011-08-17 NOTE — Transfer of Care (Signed)
Immediate Anesthesia Transfer of Care Note  Patient: HA SHANNAHAN  Procedure(s) Performed: Procedure(s) (LRB): SHOULDER ARTHROSCOPY WITH ROTATOR CUFF REPAIR AND SUBACROMIAL DECOMPRESSION (Left) EXAM UNDER ANESTHESIA WITH MANIPULATION OF SHOULDER (Left)  Patient Location: PACU  Anesthesia Type: GA combined with regional for post-op pain  Level of Consciousness: awake, alert , sedated and patient cooperative  Airway & Oxygen Therapy: Patient Spontanous Breathing and Patient connected to face mask oxygen  Post-op Assessment: Report given to PACU RN and Post -op Vital signs reviewed and stable  Post vital signs: Reviewed and stable  Complications: No apparent anesthesia complications

## 2011-08-17 NOTE — Op Note (Signed)
Colleen Mckinney, Colleen Mckinney NO.:  [id number]  MEDICAL RECORD NO.:  [id number]  LOCATION:  [facility]                         FACILITY:  [facility]  PHYSICIAN:  Jene Every, M.D.    DATE OF BIRTH:  [birth year]  DATE OF PROCEDURE:  08/17/2011 DATE OF DISCHARGE:                              OPERATIVE REPORT   PREOPERATIVE DIAGNOSIS:  Adhesive capsulitis, rotator cuff tear, impingement syndrome of the left shoulder.  POSTOPERATIVE DIAGNOSIS:  Adhesive capsulitis, rotator cuff tear, impingement syndrome of the left shoulder, labral tear.  PROCEDURE PERFORMED: 1. Examination under anesthesia followed by manipulation under     anesthesia. 2. Left shoulder arthroscopy, debridement of anterior superior labrum. 3. Subacromial decompression, acromioplasty, bursectomy and     debridement of partial tear of the rotator cuff.  ANESTHESIA:  General.  ASSISTANT:  Roma Schanz, P.A.  HISTORY:  This is a 66 year old with impingement syndrome leading to adhesive capsulitis, refractory to rest, activity modification, corticosteroid injection and therapy.  MRI indicating impingement, degenerative changes, full-thickness tears, indicated for manipulation, followed by arthroscopic debridement and subacromial decompression as that was felt to be the primary cause of her initial pain.  Risk and benefits were discussed including bleeding, infection, damage to vascular structures, no change in symptoms, worsening symptoms, need for repeat debridement, DVT, PE, anesthetic complications, fracture, inability to achieve full range etc.  TECHNIQUE:  With the patient in supine, beach-chair position, after induction of adequate general  anesthesia, 2 g Kefzol, it was examined under anesthesia.  Forward flexion was 110 degrees, abduction was 60 degrees, external rotation was 40 degrees, internal rotation was decreased by 30 degrees in comparison to the contralateral side.  With the scapulothoracic  region stabilized after a block, we manipulated her first forward flexion to achieve 140 degrees of flexion with a gentle manipulation and lysis of adhesions appreciated.  We then performed abduction and lysed the adhesions, was able to achieve 120 degrees of abduction without difficulty again stabilizing the scapulothoracic region.  Then internal and external rotation, external rotation was actually good, internal rotation and abduction, 6 degrees of abduction was improved by internal rotation.  Grasping the humerus proximally and performing a slight transablation anteriorly and posteriorly.  This was performed in the abducted position, in the position at the patient's side.  We improved her internal rotation to, in the abducted position within 10 degrees of the contralateral side.  She was placed in the upright beach-chair position.  Left arm and upper extremity was prepped and draped in the usual sterile fashion.  Surgical marker was utilized to delineate the acromion, AC joint and coracoid.  Standard posterolateral and anterolateral portals were utilized with incision through the skin only.  With the arm in the 70-30 position, gentle traction was applied.  The arthroscopic camera was then advanced to the glenohumeral joint, penetrating atraumatically.  Under direct visualization, an 18-gauge needle was utilized to localize the anterior portal right beneath the biceps tendon, between the coracoid and the anterolateral aspect of the acromion.  The glenohumeral joint was irrigated and insufflated, we were initially at 60 and then we reduced it to 35.  The hemorrhagic debris was noted.  There was  an indurated capsule noted.  There was fraying and tearing of the labrum.  I introduced a shaver and debrided the anterior superior labrum and the posterior labrum.  There was some extension into the joint.  The rotator cuff appeared to be torn.  The subscapular was unremarkable.  The  biceps tendon was not torn.  After the debridement, it was then lavaged.  We redirected the camera in the subacromial space, with an anterolateral portal.  We triangulated.  Hypertrophic bursa was noted.  After the insufflation, we introduced a shaver and performed a full bursectomy and slight fraying of the rotator cuff, beneath the anterolateral aspect of the acromion a large impingement lesion with a thickened CA ligament. After the subacromial decompression we used an ArthroWand to release and morselize the CA ligament and then performed acromioplasty in the small area on the anterolateral aspect of the acromion.  That led to impingement with the arm abducted.  No full-thickness or significant tear was noted.  We therefore reexamined the joint and there was no further pathology amenable to arthroscopic intervention.  We therefore removed all instrumentation.  Portals were closed with 4-0 nylon simple sutures, 0.25% Marcaine with epinephrine was infiltrated into the joint. The wound was dressed sterilely.  The patient was placed in an abduction pillow, extubated without difficulty, and transported to recovery room in satisfactory condition.  The patient tolerated the procedure well.  There were no complications. Assistant Roma Schanz, to help for shoulder stabilization, gentle traction and arthroscopic flow.  Minimal blood loss.     Jene Every, M.D.     Cordelia Pen  D:  08/17/2011  T:  08/17/2011  Job:  [id number]

## 2011-08-24 ENCOUNTER — Ambulatory Visit: Payer: Medicare Other | Attending: Specialist | Admitting: Physical Therapy

## 2011-08-24 DIAGNOSIS — M25619 Stiffness of unspecified shoulder, not elsewhere classified: Secondary | ICD-10-CM | POA: Insufficient documentation

## 2011-08-24 DIAGNOSIS — M25519 Pain in unspecified shoulder: Secondary | ICD-10-CM | POA: Insufficient documentation

## 2011-08-24 DIAGNOSIS — IMO0001 Reserved for inherently not codable concepts without codable children: Secondary | ICD-10-CM | POA: Insufficient documentation

## 2011-08-25 ENCOUNTER — Ambulatory Visit: Payer: Medicare Other | Admitting: Physical Therapy

## 2011-08-28 ENCOUNTER — Ambulatory Visit: Payer: Medicare Other | Admitting: Physical Therapy

## 2011-08-31 ENCOUNTER — Ambulatory Visit: Payer: Medicare Other | Admitting: Physical Therapy

## 2011-09-04 ENCOUNTER — Ambulatory Visit: Payer: Medicare Other | Attending: Specialist | Admitting: Physical Therapy

## 2011-09-04 DIAGNOSIS — M25519 Pain in unspecified shoulder: Secondary | ICD-10-CM | POA: Insufficient documentation

## 2011-09-04 DIAGNOSIS — M25619 Stiffness of unspecified shoulder, not elsewhere classified: Secondary | ICD-10-CM | POA: Insufficient documentation

## 2011-09-04 DIAGNOSIS — IMO0001 Reserved for inherently not codable concepts without codable children: Secondary | ICD-10-CM | POA: Insufficient documentation

## 2011-09-08 ENCOUNTER — Ambulatory Visit: Payer: Medicare Other | Admitting: Physical Therapy

## 2011-09-11 ENCOUNTER — Ambulatory Visit: Payer: Medicare Other | Admitting: Physical Therapy

## 2011-09-15 ENCOUNTER — Ambulatory Visit: Payer: Medicare Other | Admitting: Physical Therapy

## 2011-09-18 ENCOUNTER — Encounter: Payer: Medicare Other | Admitting: Physical Therapy

## 2011-09-20 ENCOUNTER — Ambulatory Visit: Payer: Medicare Other | Admitting: Physical Therapy

## 2011-09-22 ENCOUNTER — Ambulatory Visit: Payer: Medicare Other | Admitting: Physical Therapy

## 2011-09-26 ENCOUNTER — Ambulatory Visit: Payer: Medicare Other | Admitting: Physical Therapy

## 2011-09-28 ENCOUNTER — Ambulatory Visit: Payer: Medicare Other | Admitting: Physical Therapy

## 2011-09-29 ENCOUNTER — Ambulatory Visit: Payer: Medicare Other | Admitting: Physical Therapy

## 2011-10-04 ENCOUNTER — Ambulatory Visit: Payer: Medicare Other | Attending: Specialist | Admitting: Physical Therapy

## 2011-10-04 DIAGNOSIS — IMO0001 Reserved for inherently not codable concepts without codable children: Secondary | ICD-10-CM | POA: Insufficient documentation

## 2011-10-04 DIAGNOSIS — M25619 Stiffness of unspecified shoulder, not elsewhere classified: Secondary | ICD-10-CM | POA: Insufficient documentation

## 2011-10-04 DIAGNOSIS — M25519 Pain in unspecified shoulder: Secondary | ICD-10-CM | POA: Insufficient documentation

## 2011-10-11 ENCOUNTER — Ambulatory Visit: Payer: Medicare Other | Admitting: Physical Therapy

## 2011-10-17 ENCOUNTER — Ambulatory Visit: Payer: Medicare Other | Admitting: Physical Therapy

## 2011-10-25 ENCOUNTER — Ambulatory Visit: Payer: Medicare Other | Admitting: Physical Therapy

## 2011-11-06 ENCOUNTER — Ambulatory Visit: Payer: Medicare Other | Attending: Specialist | Admitting: Physical Therapy

## 2011-11-06 DIAGNOSIS — IMO0001 Reserved for inherently not codable concepts without codable children: Secondary | ICD-10-CM | POA: Insufficient documentation

## 2011-11-06 DIAGNOSIS — M25519 Pain in unspecified shoulder: Secondary | ICD-10-CM | POA: Insufficient documentation

## 2011-11-06 DIAGNOSIS — M25619 Stiffness of unspecified shoulder, not elsewhere classified: Secondary | ICD-10-CM | POA: Insufficient documentation

## 2012-01-22 ENCOUNTER — Ambulatory Visit (INDEPENDENT_AMBULATORY_CARE_PROVIDER_SITE_OTHER): Payer: Medicare Other | Admitting: Family Medicine

## 2012-01-22 ENCOUNTER — Encounter: Payer: Self-pay | Admitting: Family Medicine

## 2012-01-22 VITALS — BP 114/72 | HR 73 | Temp 98.2°F | Wt 112.0 lb

## 2012-01-22 DIAGNOSIS — B029 Zoster without complications: Secondary | ICD-10-CM

## 2012-01-22 MED ORDER — VALACYCLOVIR HCL 1 G PO TABS: 1000.0000 mg | ORAL_TABLET | Freq: Three times a day (TID) | ORAL | Status: AC

## 2012-01-22 MED ORDER — TRAMADOL HCL 50 MG PO TABS: 50.0000 mg | ORAL_TABLET | Freq: Three times a day (TID) | ORAL | Status: AC | PRN

## 2012-01-22 NOTE — Progress Notes (Signed)
  Subjective:     Colleen Mckinney is a 66 y.o. female who presents for evaluation of a rash involving the abd and pain in L flank. Rash started several days ago. Lesions are pink, and blistering in texture. Rash has changed over time. Rash is painful. Associated symptoms: back pain. Patient denies: abdominal pain, arthralgia, congestion, cough, crankiness, decrease in appetite, decrease in energy level, fever, headache, irritability, myalgia, nausea, sore throat and vomiting. Patient has not had contacts with similar rash. Patient has not had new exposures (soaps, lotions, laundry detergents, foods, medications, plants, insects or animals).  The following portions of the patient's history were reviewed and updated as appropriate: allergies, current medications, past family history, past medical history, past social history, past surgical history and problem list.  Review of Systems Pertinent items are noted in HPI.    Objective:    BP 114/72  Pulse 73  Temp 98.2 F (36.8 C) (Oral)  Wt 112 lb (50.803 kg)  SpO2 98% General:  alert, cooperative, appears stated age and no distress  Skin:  vesicles noted on L side abd     Assessment:    shingles    Plan:    Medications: valtrex. Written patient instruction given. Follow up in several days. --if needed

## 2012-01-22 NOTE — Patient Instructions (Addendum)

## 2012-05-10 ENCOUNTER — Encounter: Payer: Self-pay | Admitting: Internal Medicine

## 2012-08-17 ENCOUNTER — Other Ambulatory Visit: Payer: Self-pay

## 2013-05-08 ENCOUNTER — Other Ambulatory Visit: Payer: Self-pay

## 2013-09-01 ENCOUNTER — Other Ambulatory Visit: Payer: Self-pay | Admitting: Specialist

## 2013-09-01 DIAGNOSIS — M7580 Other shoulder lesions, unspecified shoulder: Principal | ICD-10-CM

## 2013-09-01 DIAGNOSIS — M25819 Other specified joint disorders, unspecified shoulder: Secondary | ICD-10-CM

## 2013-09-05 ENCOUNTER — Other Ambulatory Visit: Payer: Self-pay | Admitting: Obstetrics

## 2013-09-05 DIAGNOSIS — R1011 Right upper quadrant pain: Secondary | ICD-10-CM

## 2013-09-09 ENCOUNTER — Ambulatory Visit
Admission: RE | Admit: 2013-09-09 | Discharge: 2013-09-09 | Disposition: A | Discharge status: Home / Self Care | Payer: Medicare Other | Source: Ambulatory Visit | Attending: Specialist | Admitting: Specialist

## 2013-09-09 DIAGNOSIS — M25819 Other specified joint disorders, unspecified shoulder: Secondary | ICD-10-CM

## 2013-09-09 DIAGNOSIS — M7580 Other shoulder lesions, unspecified shoulder: Principal | ICD-10-CM

## 2013-09-12 ENCOUNTER — Other Ambulatory Visit: Payer: Self-pay | Admitting: Obstetrics

## 2013-09-12 ENCOUNTER — Ambulatory Visit
Admission: RE | Admit: 2013-09-12 | Discharge: 2013-09-12 | Disposition: A | Discharge status: Home / Self Care | Payer: Medicare Other | Source: Ambulatory Visit | Attending: Obstetrics | Admitting: Obstetrics

## 2013-09-12 DIAGNOSIS — R1011 Right upper quadrant pain: Secondary | ICD-10-CM

## 2013-10-14 ENCOUNTER — Other Ambulatory Visit: Payer: Self-pay | Admitting: Orthopedic Surgery

## 2013-10-15 ENCOUNTER — Encounter (HOSPITAL_COMMUNITY): Payer: Self-pay | Admitting: Pharmacy Technician

## 2013-10-16 ENCOUNTER — Encounter (HOSPITAL_COMMUNITY)
Admission: RE | Admit: 2013-10-16 | Discharge: 2013-10-16 | Disposition: A | Discharge status: Home / Self Care | Payer: Medicare Other | Source: Ambulatory Visit | Attending: Specialist | Admitting: Specialist

## 2013-10-16 ENCOUNTER — Encounter (HOSPITAL_COMMUNITY): Payer: Self-pay

## 2013-10-16 ENCOUNTER — Other Ambulatory Visit: Payer: Self-pay | Admitting: Orthopedic Surgery

## 2013-10-16 DIAGNOSIS — M7580 Other shoulder lesions, unspecified shoulder: Secondary | ICD-10-CM

## 2013-10-16 DIAGNOSIS — Z01812 Encounter for preprocedural laboratory examination: Secondary | ICD-10-CM | POA: Insufficient documentation

## 2013-10-16 DIAGNOSIS — M7500 Adhesive capsulitis of unspecified shoulder: Secondary | ICD-10-CM | POA: Insufficient documentation

## 2013-10-16 DIAGNOSIS — M25819 Other specified joint disorders, unspecified shoulder: Secondary | ICD-10-CM | POA: Insufficient documentation

## 2013-10-16 HISTORY — DX: Disorder of the skin and subcutaneous tissue, unspecified: L98.9

## 2013-10-16 NOTE — Patient Instructions (Signed)
Colleen Mckinney  10/16/2013   Your procedure is scheduled on:  4-23 -2015  Report to [facility] at    0700    AM.  Call this number if you have problems the morning of surgery: [phone]  Or Presurgical Testing [phone](Rylie Knierim) For Living Will and/or Health Care Power Attorney Forms: please provide copy for your medical record,may bring AM of surgery(Forms should be already notarized -we do not provide this service).(10-16-13 Will bring copy AM of 10-23-13 to place with records)    Do not eat food:After Midnight.   Take these medicines the morning of surgery with A SIP OF WATER: Tylenol if needed. Zyrtec if needed.   Do not wear jewelry, make-up or nail polish.  Do not wear lotions, powders, or perfumes. You may wear deodorant.  Do not shave 48 hours(2 days) prior to first CHG shower(legs and under arms).(Shaving face and neck okay.)  Do not bring valuables to the hospital.(Hospital is not responsible for lost valuables).  Contacts, dentures or removable bridgework, body piercing, hair pins may not be worn into surgery.  Leave suitcase in the car. After surgery it may be brought to your room.  For patients admitted to the hospital, checkout time is 11:00 AM the day of discharge.(Restricted visitors-Any Persons displaying flu-like symptoms or illness).    Patients discharged the day of surgery will not be allowed to drive home. Must have responsible person with you x 24 hours once discharged.  Name and phone number of your driver: Horton Finer - [phone] Special Instructions: CHG(Chlorhedine 4%-"Hibiclens","Betasept","Aplicare") Shower Use Special Wash: see special instructions.(avoid face and genitals)   Please read over the following fact sheets that you were given:  Incentive Spirometry Instruction.    Failure to follow these instructions may result in Cancellation of your surgery.   Patient signature_______________________________________________________

## 2013-10-16 NOTE — Pre-Procedure Instructions (Addendum)
10-16-13 No labs required per anesthesia guidelines. EKG 11'12 Epic, labs-CBC, BMP  2'15 Epic.

## 2013-10-16 NOTE — H&P (Signed)
Colleen Mckinney is an 68 y.o. female.    Chief Complaint: right shoulder pain  HPI: The patient is a 68 year old female who presents today for follow up of their shoulder. The patient is being followed for their right shoulder pain. They are 5 month(s) out from when symptoms began. Symptoms reported today include: pain. and report their pain level to be moderate. Current treatment includes: activity modification and NSAIDs. The following medication has been used for pain control: ibuprofen (as needed). The patient presents today following MRI. Note for "Follow-up shoulder": Colleen Mckinney follows up today for ongoing R shoulder pain and stiffness. Her symptoms started without injury in November, and she saw Dr. Tonita Cong in December, had a subacromial steroid injection. Her pain was worse x 2-3 days but then the injection worked for about a week. After that her pain gradually returned. She has been working on [employer], cautious about activity, is not doing any overhead activity. She did try NSAIDs daily (ibuprofen 600mg  TID x 3 weeks) with no relief, now taking prn only, before bed. She does have trouble sleeping with this. The pain shoots down her arm and sometimes up into her neck. She denies numbness or tingling. 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 Tonita Cong in 08/2011 and after completing PT did regain full ROM. She does think with the left shoulder she injured it golfing. She is not diabetic. Had recent labwork at her gyn office which was normal except some hyperkalemia.  Past Medical History  Diagnosis Date  . 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 Left shoulder pain   . Anxiety     claustrophobia, fear of heights  . Complication of anesthesia     remembers severe sore throat after kidney surgery and feeling dizzy  . Skin lesion     "pre-cancer" -past hx.    Past Surgical History  Procedure Laterality Date  . Nephrectomy  01/1998    right--donation  .  Hysteroscopy    . Shoulder arthroscopy Left     "impingement" "frozen shoulder"  . Cervix surgery      "freezing" dysplasia  . Breast surgery Right     lumpectomy-benign    Family History  Problem Relation Age of Onset  . Heart disease Mother     mitral valve prolapse  . Hypertension Mother   . Hyperlipidemia Mother   . Cancer Mother 74    breast  . Osteopenia Mother   . Heart disease Father     bypass  . Hypertension Father   . Hyperlipidemia Father   . Cancer Father 93    bladder,  prostate  . Hyperlipidemia Sister    Social History:  reports that she has never smoked. She has never used smokeless tobacco. She reports that she drinks about 1.8 ounces of alcohol per week. She reports that she does not use illicit drugs.  Allergies: No Known Allergies   (Not in a hospital admission)  No results found for this or any previous visit (from the past 48 hour(s)). No results found.  Review of Systems  Constitutional: Negative.   HENT: Negative.   Eyes: Negative.   Respiratory: Negative.   Cardiovascular: Negative.   Gastrointestinal: Negative.   Genitourinary: Negative.   Musculoskeletal: Positive for joint pain.  Skin: Negative.   Neurological: Negative.   Psychiatric/Behavioral: Negative.     There were no vitals taken for this visit. Physical Exam  Constitutional:  She is oriented to person, place, and time. She appears well-developed and well-nourished.  HENT:  Head: Normocephalic and atraumatic.  Eyes: Conjunctivae and EOM are normal. Pupils are equal, round, and reactive to light.  Neck: Normal range of motion. Neck supple.  Cardiovascular: Normal rate and regular rhythm.   Respiratory: Effort normal and breath sounds normal.  GI: Soft. Bowel sounds are normal.  Musculoskeletal:  General Mental Status - Alert. General Appearance- pleasant and In acute distress (mild). Orientation- Oriented X3. Build & Nutrition- Well nourished and Well developed.  Gait- Unassisted and unimpaired.   Musculoskeletal Upper Extremity Left Upper Extremity: Left Shoulder: Inspection and Palpation:Tenderness- no tenderness to palpation of the [facility] joint, no tenderness to palpation of the [place] joint, no tenderness to palpation of the clavicle and no tenderness to palpation of the subacromial space. Swelling- none. Tissue tension/texture is - soft. Sensation- intact to light touch. Skin:Color- no ecchymosis and no erythema. ROM: Internal Rotation:AROM- severely decreased and painful. External Rotation:AROM- moderately decreased and painful. Flexion:AROM- moderately decreased (palm up, isolated to 90 degrees). Glenohumeral Abduction:AROM- moderately decreased (to 70 degrees isolated) and painful. Strength and Tone:Biceps- 5/5. Deltoid- normal. Triceps- 5/5. Abduction- 5/5 and painful. Internal Rotation- 5/5. External Rotation- 5/5 and painful. Rotator Cuff- normal. Instability- sulcus sign negative. Impingement- impingement sign positive and secondary impingement sign positive. Deformities/Malalignments/Discrepancies- no deformities noted. Special Testing- Speed's test negative.  Neurological: She is alert and oriented to person, place, and time. She has normal reflexes.  Skin: Skin is warm and dry.  Psychiatric: She has a normal mood and affect.    MRI R shoulder images and report reviewed today by Dr. Tonita Cong with no RCT. Mild AC arthrosis. Adhesive capsulitis noted. Subscap and supraspinatus tendinopathy.  Assessment/Plan R shoulder impingement, adhesive capsulitis  Pt with ongoing R shoulder pain x 5 months without injury, consistent with impingement as well as adhesive capsulitis, refractory to NSAIDs, HEP, activity modifications, steroid injection. We again discussed relevant anatomy as well as MRI findings, discussed tx options. She declined repeat injection. We discussed possible need for surgery if ongoing. She would like to  try formal PT again prior to surgery to see if this helps at all with her motion. We discussed that if her symptoms are ongoing despite PT, or worsening, would recommend proceeding with R shoulder arthroscopy, SAD, EUA, MUA. Discussed the procedure itself as well as risks, complications and alternatives, including but not limited to DVT, PE, infx, bleeding, failure of procedure, need for secondary procedure, ongoing pain/symptoms, anesthesia risk, even stroke or death. Also discussed typical post-op protocols, time out of work, ice and elevation, HEP/PT, sling. Discussed importance of activity modifications long term, possible future recurrence and possible need for repeat steroid injections. All questions were answered. When patient is ready to proceed with scheduling, she will call. SFS was filled out during her visit today. She is otherwise healthy and would not require clearance. She declined pain medication, will continue NSAIDs prn. She will follow up as needed and call with any questions, concerns, ongoing/worsening symptoms, or if ready to proceed with surgery.  Plan right shoulder arthroscopy, SAD, EUA, MUA   Jacquelyn Shadrick M. Erinn Mendosa for Dr. Tonita Cong 10/16/2013, 9:38 AM

## 2013-10-23 ENCOUNTER — Encounter (HOSPITAL_COMMUNITY)
Admission: RE | Disposition: A | Discharge status: Home / Self Care | Payer: Self-pay | Source: Ambulatory Visit | Attending: Specialist

## 2013-10-23 ENCOUNTER — Ambulatory Visit (HOSPITAL_COMMUNITY)
Admission: RE | Admit: 2013-10-23 | Discharge: 2013-10-23 | Disposition: A | Discharge status: Home / Self Care | Payer: Medicare Other | Source: Ambulatory Visit | Attending: Specialist | Admitting: Specialist

## 2013-10-23 ENCOUNTER — Encounter (HOSPITAL_COMMUNITY): Payer: Self-pay | Admitting: *Deleted

## 2013-10-23 ENCOUNTER — Encounter (HOSPITAL_COMMUNITY): Payer: Medicare Other | Admitting: Anesthesiology

## 2013-10-23 ENCOUNTER — Ambulatory Visit (HOSPITAL_COMMUNITY): Payer: Medicare Other | Admitting: Anesthesiology

## 2013-10-23 DIAGNOSIS — M25819 Other specified joint disorders, unspecified shoulder: Secondary | ICD-10-CM | POA: Insufficient documentation

## 2013-10-23 DIAGNOSIS — Z905 Acquired absence of kidney: Secondary | ICD-10-CM | POA: Insufficient documentation

## 2013-10-23 DIAGNOSIS — M7500 Adhesive capsulitis of unspecified shoulder: Secondary | ICD-10-CM

## 2013-10-23 DIAGNOSIS — M7580 Other shoulder lesions, unspecified shoulder: Secondary | ICD-10-CM

## 2013-10-23 HISTORY — PX: SHOULDER ARTHROSCOPY WITH SUBACROMIAL DECOMPRESSION: SHX5684

## 2013-10-23 SURGERY — SHOULDER ARTHROSCOPY WITH SUBACROMIAL DECOMPRESSION
Anesthesia: General | Site: Shoulder | Laterality: Right

## 2013-10-23 MED ORDER — ROCURONIUM BROMIDE 100 MG/10ML IV SOLN
INTRAVENOUS | Status: DC | PRN
  Administered 2013-10-23: 5 mg via INTRAVENOUS
  Administered 2013-10-23: 20 mg via INTRAVENOUS

## 2013-10-23 MED ORDER — LIDOCAINE-EPINEPHRINE 2 %-1:100000 IJ SOLN
INTRAMUSCULAR | Status: DC | PRN
  Administered 2013-10-23: 10 mL via PERINEURAL

## 2013-10-23 MED ORDER — BUPIVACAINE-EPINEPHRINE PF 0.5-1:200000 % IJ SOLN
INTRAMUSCULAR | Status: AC
  Filled 2013-10-23: qty 30

## 2013-10-23 MED ORDER — PHENYLEPHRINE HCL 10 MG/ML IJ SOLN
INTRAMUSCULAR | Status: DC | PRN
  Administered 2013-10-23: 120 ug via INTRAVENOUS
  Administered 2013-10-23 (×2): 80 ug via INTRAVENOUS

## 2013-10-23 MED ORDER — PROPOFOL 10 MG/ML IV BOLUS
INTRAVENOUS | Status: AC
  Filled 2013-10-23: qty 20

## 2013-10-23 MED ORDER — ONDANSETRON HCL 4 MG/2ML IJ SOLN
INTRAMUSCULAR | Status: AC
  Filled 2013-10-23: qty 2

## 2013-10-23 MED ORDER — FENTANYL CITRATE 0.05 MG/ML IJ SOLN
INTRAMUSCULAR | Status: DC | PRN
  Administered 2013-10-23 (×2): 50 ug via INTRAVENOUS

## 2013-10-23 MED ORDER — METHOCARBAMOL 500 MG PO TABS: 500.0000 mg | ORAL_TABLET | Freq: Three times a day (TID) | ORAL | Status: DC | PRN

## 2013-10-23 MED ORDER — EPINEPHRINE HCL 1 MG/ML IJ SOLN
INTRAMUSCULAR | Status: DC | PRN
  Administered 2013-10-23: 2 mL

## 2013-10-23 MED ORDER — ONDANSETRON HCL 4 MG/2ML IJ SOLN
INTRAMUSCULAR | Status: DC | PRN
  Administered 2013-10-23: 4 mg via INTRAVENOUS

## 2013-10-23 MED ORDER — BUPIVACAINE-EPINEPHRINE 0.5% -1:200000 IJ SOLN
INTRAMUSCULAR | Status: DC | PRN
  Administered 2013-10-23: 8 mL

## 2013-10-23 MED ORDER — LIDOCAINE HCL (CARDIAC) 20 MG/ML IV SOLN
INTRAVENOUS | Status: AC
Start: 2013-10-23 — End: 2013-10-23
  Filled 2013-10-23: qty 5

## 2013-10-23 MED ORDER — ROPIVACAINE HCL 5 MG/ML IJ SOLN
INTRAMUSCULAR | Status: AC
  Filled 2013-10-23: qty 30

## 2013-10-23 MED ORDER — ROPIVACAINE HCL 5 MG/ML IJ SOLN
INTRAMUSCULAR | Status: DC | PRN
  Administered 2013-10-23: 20 mL via PERINEURAL

## 2013-10-23 MED ORDER — NEOSTIGMINE METHYLSULFATE 1 MG/ML IJ SOLN
INTRAMUSCULAR | Status: DC | PRN
  Administered 2013-10-23: 4 mg via INTRAVENOUS

## 2013-10-23 MED ORDER — CEFAZOLIN SODIUM-DEXTROSE 2-3 GM-% IV SOLR
INTRAVENOUS | Status: AC
  Filled 2013-10-23: qty 50

## 2013-10-23 MED ORDER — HYDROMORPHONE HCL PF 1 MG/ML IJ SOLN: 0.2500 mg | INTRAMUSCULAR | Status: DC | PRN

## 2013-10-23 MED ORDER — ROCURONIUM BROMIDE 100 MG/10ML IV SOLN
INTRAVENOUS | Status: AC
  Filled 2013-10-23: qty 1

## 2013-10-23 MED ORDER — GLYCOPYRROLATE 0.2 MG/ML IJ SOLN
INTRAMUSCULAR | Status: DC | PRN
  Administered 2013-10-23: .6 mg via INTRAVENOUS

## 2013-10-23 MED ORDER — HYDROCODONE-ACETAMINOPHEN 5-325 MG PO TABS: 1.0000 | ORAL_TABLET | ORAL | Status: DC | PRN

## 2013-10-23 MED ORDER — SODIUM CHLORIDE 0.9 % IR SOLN
Status: DC | PRN
  Administered 2013-10-23: 6000 mL

## 2013-10-23 MED ORDER — PROMETHAZINE HCL 25 MG/ML IJ SOLN: 6.2500 mg | INTRAMUSCULAR | Status: DC | PRN

## 2013-10-23 MED ORDER — LACTATED RINGERS IV SOLN
INTRAVENOUS | Status: DC | PRN
  Administered 2013-10-23: 09:00:00 via INTRAVENOUS

## 2013-10-23 MED ORDER — EPINEPHRINE HCL 1 MG/ML IJ SOLN
INTRAMUSCULAR | Status: AC
  Filled 2013-10-23: qty 2

## 2013-10-23 MED ORDER — FENTANYL CITRATE 0.05 MG/ML IJ SOLN
INTRAMUSCULAR | Status: AC
  Filled 2013-10-23: qty 5

## 2013-10-23 MED ORDER — EPHEDRINE SULFATE 50 MG/ML IJ SOLN
INTRAMUSCULAR | Status: DC | PRN
  Administered 2013-10-23 (×2): 5 mg via INTRAVENOUS
  Administered 2013-10-23: 10 mg via INTRAVENOUS
  Administered 2013-10-23: 5 mg via INTRAVENOUS

## 2013-10-23 MED ORDER — LIDOCAINE HCL (CARDIAC) 20 MG/ML IV SOLN
INTRAVENOUS | Status: DC | PRN
  Administered 2013-10-23: 50 mg via INTRAVENOUS

## 2013-10-23 MED ORDER — CEFAZOLIN SODIUM-DEXTROSE 2-3 GM-% IV SOLR
2.0000 g | INTRAVENOUS | Status: AC
Start: 2013-10-23 — End: 2013-10-23
  Administered 2013-10-23: 2 g via INTRAVENOUS

## 2013-10-23 MED ORDER — MIDAZOLAM HCL 2 MG/2ML IJ SOLN
INTRAMUSCULAR | Status: AC
  Filled 2013-10-23: qty 2

## 2013-10-23 MED ORDER — PROPOFOL 10 MG/ML IV BOLUS
INTRAVENOUS | Status: DC | PRN
  Administered 2013-10-23: 130 mg via INTRAVENOUS

## 2013-10-23 MED ORDER — LIDOCAINE-EPINEPHRINE (PF) 2 %-1:200000 IJ SOLN
INTRAMUSCULAR | Status: AC
Start: 2013-10-23 — End: 2013-10-23
  Filled 2013-10-23: qty 20

## 2013-10-23 MED ORDER — DOCUSATE SODIUM 100 MG PO CAPS: 100.0000 mg | ORAL_CAPSULE | Freq: Two times a day (BID) | ORAL | Status: DC | PRN

## 2013-10-23 MED ORDER — MIDAZOLAM HCL 5 MG/5ML IJ SOLN
INTRAMUSCULAR | Status: DC | PRN
  Administered 2013-10-23: 2 mg via INTRAVENOUS

## 2013-10-23 SURGICAL SUPPLY — 32 items
BLADE 4.2CUDA (BLADE) ×3 IMPLANT
BLADE SURG SZ11 CARB STEEL (BLADE) ×3 IMPLANT
BOOTIES KNEE HIGH SLOAN (MISCELLANEOUS) ×6 IMPLANT
BUR OVAL 4.0 (BURR) IMPLANT
CANNULA ACUFO 5X76 (CANNULA) ×4 IMPLANT
CLOTH 2% CHLOROHEXIDINE 3PK (PERSONAL CARE ITEMS) ×3 IMPLANT
DRAPE STERI 35X30 U-POUCH (DRAPES) ×3 IMPLANT
DRAPE U-SHAPE 47X51 STRL (DRAPES) ×3 IMPLANT
DRSG AQUACEL AG ADV 3.5X 6 (GAUZE/BANDAGES/DRESSINGS) ×2 IMPLANT
DURAPREP 26ML APPLICATOR (WOUND CARE) ×3 IMPLANT
GLOVE BIOGEL PI IND STRL 7.5 (GLOVE) ×1 IMPLANT
GLOVE BIOGEL PI IND STRL 8 (GLOVE) ×1 IMPLANT
GLOVE BIOGEL PI INDICATOR 7.5 (GLOVE) ×2
GLOVE BIOGEL PI INDICATOR 8 (GLOVE) ×2
GLOVE SURG SS PI 7.5 STRL IVOR (GLOVE) ×3 IMPLANT
GLOVE SURG SS PI 8.0 STRL IVOR (GLOVE) ×3 IMPLANT
GOWN STRL REUS W/TWL XL LVL3 (GOWN DISPOSABLE) ×6 IMPLANT
KIT POSITION SHOULDER SCHLEI (MISCELLANEOUS) ×3 IMPLANT
MANIFOLD NEPTUNE II (INSTRUMENTS) ×3 IMPLANT
NDL SPNL 18GX3.5 QUINCKE PK (NEEDLE) ×1 IMPLANT
NEEDLE SPNL 18GX3.5 QUINCKE PK (NEEDLE) ×3 IMPLANT
PACK SHOULDER CUSTOM OPM052 (CUSTOM PROCEDURE TRAY) ×3 IMPLANT
POSITIONER SURGICAL ARM (MISCELLANEOUS) ×3 IMPLANT
SET ARTHROSCOPY TUBING (MISCELLANEOUS) ×3
SET ARTHROSCOPY TUBING LN (MISCELLANEOUS) ×1 IMPLANT
SLING ARM IMMOBILIZER LRG (SOFTGOODS) IMPLANT
SLING ARM IMMOBILIZER MED (SOFTGOODS) ×3 IMPLANT
SLING ARM MED ADULT FOAM STRAP (SOFTGOODS) ×2 IMPLANT
SUT ETHILON 4 0 PS 2 18 (SUTURE) ×3 IMPLANT
TUBING CONNECTING 10 (TUBING) ×2 IMPLANT
TUBING CONNECTING 10' (TUBING) ×1
WAND 90 DEG TURBOVAC W/CORD (SURGICAL WAND) ×1 IMPLANT

## 2013-10-23 NOTE — H&P (View-Only) (Signed)
Colleen Mckinney is an 68 y.o. female.    Chief Complaint: right shoulder pain  HPI: The patient is a 68 year old female who presents today for follow up of their shoulder. The patient is being followed for their right shoulder pain. They are 5 month(s) out from when symptoms began. Symptoms reported today include: pain. and report their pain level to be moderate. Current treatment includes: activity modification and NSAIDs. The following medication has been used for pain control: ibuprofen (as needed). The patient presents today following MRI. Note for "Follow-up shoulder": Colleen Mckinney follows up today for ongoing R shoulder pain and stiffness. Her symptoms started without injury in November, and she saw Dr. Tonita Cong in December, had a subacromial steroid injection. Her pain was worse x 2-3 days but then the injection worked for about a week. After that her pain gradually returned. She has been working on [employer], cautious about activity, is not doing any overhead activity. She did try NSAIDs daily (ibuprofen 600mg  TID x 3 weeks) with no relief, now taking prn only, before bed. She does have trouble sleeping with this. The pain shoots down her arm and sometimes up into her neck. She denies numbness or tingling. 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 Tonita Cong in 08/2011 and after completing PT did regain full ROM. She does think with the left shoulder she injured it golfing. She is not diabetic. Had recent labwork at her gyn office which was normal except some hyperkalemia.  Past Medical History  Diagnosis Date  . 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 Left shoulder pain   . Anxiety     claustrophobia, fear of heights  . Complication of anesthesia     remembers severe sore throat after kidney surgery and feeling dizzy  . Skin lesion     "pre-cancer" -past hx.    Past Surgical History  Procedure Laterality Date  . Nephrectomy  01/1998    right--donation  .  Hysteroscopy    . Shoulder arthroscopy Left     "impingement" "frozen shoulder"  . Cervix surgery      "freezing" dysplasia  . Breast surgery Right     lumpectomy-benign    Family History  Problem Relation Age of Onset  . Heart disease Mother     mitral valve prolapse  . Hypertension Mother   . Hyperlipidemia Mother   . Cancer Mother 74    breast  . Osteopenia Mother   . Heart disease Father     bypass  . Hypertension Father   . Hyperlipidemia Father   . Cancer Father 93    bladder,  prostate  . Hyperlipidemia Sister    Social History:  reports that she has never smoked. She has never used smokeless tobacco. She reports that she drinks about 1.8 ounces of alcohol per week. She reports that she does not use illicit drugs.  Allergies: No Known Allergies   (Not in a hospital admission)  No results found for this or any previous visit (from the past 48 hour(s)). No results found.  Review of Systems  Constitutional: Negative.   HENT: Negative.   Eyes: Negative.   Respiratory: Negative.   Cardiovascular: Negative.   Gastrointestinal: Negative.   Genitourinary: Negative.   Musculoskeletal: Positive for joint pain.  Skin: Negative.   Neurological: Negative.   Psychiatric/Behavioral: Negative.     There were no vitals taken for this visit. Physical Exam  Constitutional:  She is oriented to person, place, and time. She appears well-developed and well-nourished.  HENT:  Head: Normocephalic and atraumatic.  Eyes: Conjunctivae and EOM are normal. Pupils are equal, round, and reactive to light.  Neck: Normal range of motion. Neck supple.  Cardiovascular: Normal rate and regular rhythm.   Respiratory: Effort normal and breath sounds normal.  GI: Soft. Bowel sounds are normal.  Musculoskeletal:  General Mental Status - Alert. General Appearance- pleasant and In acute distress (mild). Orientation- Oriented X3. Build & Nutrition- Well nourished and Well developed.  Gait- Unassisted and unimpaired.   Musculoskeletal Upper Extremity Left Upper Extremity: Left Shoulder: Inspection and Palpation:Tenderness- no tenderness to palpation of the [facility] joint, no tenderness to palpation of the [place] joint, no tenderness to palpation of the clavicle and no tenderness to palpation of the subacromial space. Swelling- none. Tissue tension/texture is - soft. Sensation- intact to light touch. Skin:Color- no ecchymosis and no erythema. ROM: Internal Rotation:AROM- severely decreased and painful. External Rotation:AROM- moderately decreased and painful. Flexion:AROM- moderately decreased (palm up, isolated to 90 degrees). Glenohumeral Abduction:AROM- moderately decreased (to 70 degrees isolated) and painful. Strength and Tone:Biceps- 5/5. Deltoid- normal. Triceps- 5/5. Abduction- 5/5 and painful. Internal Rotation- 5/5. External Rotation- 5/5 and painful. Rotator Cuff- normal. Instability- sulcus sign negative. Impingement- impingement sign positive and secondary impingement sign positive. Deformities/Malalignments/Discrepancies- no deformities noted. Special Testing- Speed's test negative.  Neurological: She is alert and oriented to person, place, and time. She has normal reflexes.  Skin: Skin is warm and dry.  Psychiatric: She has a normal mood and affect.    MRI R shoulder images and report reviewed today by Dr. Tonita Cong with no RCT. Mild AC arthrosis. Adhesive capsulitis noted. Subscap and supraspinatus tendinopathy.  Assessment/Plan R shoulder impingement, adhesive capsulitis  Pt with ongoing R shoulder pain x 5 months without injury, consistent with impingement as well as adhesive capsulitis, refractory to NSAIDs, HEP, activity modifications, steroid injection. We again discussed relevant anatomy as well as MRI findings, discussed tx options. She declined repeat injection. We discussed possible need for surgery if ongoing. She would like to  try formal PT again prior to surgery to see if this helps at all with her motion. We discussed that if her symptoms are ongoing despite PT, or worsening, would recommend proceeding with R shoulder arthroscopy, SAD, EUA, MUA. Discussed the procedure itself as well as risks, complications and alternatives, including but not limited to DVT, PE, infx, bleeding, failure of procedure, need for secondary procedure, ongoing pain/symptoms, anesthesia risk, even stroke or death. Also discussed typical post-op protocols, time out of work, ice and elevation, HEP/PT, sling. Discussed importance of activity modifications long term, possible future recurrence and possible need for repeat steroid injections. All questions were answered. When patient is ready to proceed with scheduling, she will call. SFS was filled out during her visit today. She is otherwise healthy and would not require clearance. She declined pain medication, will continue NSAIDs prn. She will follow up as needed and call with any questions, concerns, ongoing/worsening symptoms, or if ready to proceed with surgery.  Plan right shoulder arthroscopy, SAD, EUA, MUA   Jaclyn M. Bissell for Dr. Tonita Cong 10/16/2013, 9:38 AM

## 2013-10-23 NOTE — Op Note (Signed)
Colleen Mckinney, Colleen Mckinney                ACCOUNT NO.:  [id number]  MEDICAL RECORD NO.:  [id number]  LOCATION:  [facility]                         FACILITY:  [facility]  PHYSICIAN:  Susa Day, M.D.    DATE OF BIRTH:  1946-06-22  DATE OF PROCEDURE:  10/23/2013 DATE OF DISCHARGE:  10/23/2013                              OPERATIVE REPORT   PREOPERATIVE DIAGNOSIS:  Adhesive capsulitis impingement syndrome, right shoulder.  POSTOPERATIVE DIAGNOSIS:  Adhesive capsulitis impingement syndrome, right shoulder.  PROCEDURE PERFORMED: 1. Exam followed by manipulation under anesthesia right shoulder. 2. Right shoulder arthroscopy lavage of glenohumeral joint     debridement. 3. Subacromial decompression and bursectomy.  ANESTHESIA:  General.  ASSISTANT:  Cleophas Dunker, PA.  History a 68 year old, adhesive capsulitis, refractory indicated for manipulation and subacromial decompression, debridement.  Risk and benefits discussed including bleeding, infection, fracture and suboptimal range of motion, need for revision.  TECHNIQUE:  The patient in supine beach-chair position, after the induction of adequate general anesthesia, 2 g Kefzol, we examined under anesthesia.  Abduction was 45, forward flexion to 60, external rotation was 30, internal rotation 4.  Performed a gentle manipulative series to the right shoulder first and forward flexion.  The scapulothoracic region stabilized were performed a gentle forward flexion maneuver with lysis of adhesions appreciated.  We obtained 160 degrees of full forward flexion, which is comparable to contralateral side.  We then restored abduction again stabilizing the scapulothoracic region of the plan and gentle proximal abduction force of lysis of adhesions were appreciated and restoration of abduction to 120 degrees.  In a similar fashion, internal-external rotation were improved with rotary maneuver applied to the upper extremity with the scapulothoracic  region stabilized. Following this, we prepped and draped the shoulder in the usual sterile fashion.  Surgical marker utilized to delineate the acromion, AC joint, and coracoid.  Standard posterior lateral and anterolateral portals were utilized with incision through the skin only with the arm in the 70/30 position gentle traction applied by the assistant.  We advanced the arthroscopic camera in the glenohumeral joint penetrating the capsule atraumatically.  Irrigant was utilized to insufflate the joint at 65 mmHg.  Hemorrhagic debris was noted.  We lavaged the joint copiously. The biceps was unremarkable.  The rotator cuff was unremarkable.  Mild degenerative changes.  No significant tearing of the labrum and therefore redirected the camera in the subacromial space.  Hypertrophic bursa was noted.  I triangulated with the anterolateral portal.  We introduced a shaver and performed a full bursectomy released CA ligament, shaved the anterolateral aspect of the acromion mildly. Inspected the cuff, no tear.  Following this, we had adequate space in the subacromial region.  Therefore,  removed all instrumentation. Portals closed with 4-0 nylon.  Sterile dressing applied.  Placed in a sling, extubated, transported to the recovery room in satisfactory condition.  The patient tolerated the procedure well with no complications. Cleophas Dunker assistant was used.  Traction of the upper extremity, maintaining inflow and outflow of the arthroscopic camera closure, patient positioning.     Susa Day, M.D.     Geralynn Rile  D:  10/23/2013  T:  10/23/2013  Job:  [id number]

## 2013-10-23 NOTE — Brief Op Note (Signed)
10/23/2013  9:45 AM  PATIENT:  Victorino Dike  68 y.o. female  PRE-OPERATIVE DIAGNOSIS:  RIGHT SHOULDER IMPINGEMENT AND ADHESIVE CAPSULITIS  POST-OPERATIVE DIAGNOSIS:  RIGHT SHOULDER IMPINGEMENT AND ADHESIVE CAPSULITIS  PROCEDURE:  Procedure(s): RIGHT SHOULDER ARTHROSCOPY WITH SUBACROMIAL DECOMPRESSION, EVULATION UNDER ANESTHESIA  AND MANIPULATION UNDER ANESTHESIA (Right)  SURGEON:  Surgeon(s) and Role:    * Johnn Hai, MD - Primary  PHYSICIAN ASSISTANT:   ASSISTANTS: Bissell   ANESTHESIA:   general  EBL:     BLOOD ADMINISTERED:none  DRAINS: none   LOCAL MEDICATIONS USED:  MARCAINE     SPECIMEN:  No Specimen  DISPOSITION OF SPECIMEN:  N/A  COUNTS:  YES  TOURNIQUET:  * No tourniquets in log *  DICTATION: .Other Dictation: Dictation Number [id number]  PLAN OF CARE: Discharge to home after PACU  PATIENT DISPOSITION:  PACU - hemodynamically stable.   Delay start of Pharmacological VTE agent (>24hrs) due to surgical blood loss or risk of bleeding: no

## 2013-10-23 NOTE — Anesthesia Procedure Notes (Signed)
Anesthesia Regional Block:  Interscalene brachial plexus block  Pre-Anesthetic Checklist: ,, timeout performed, Correct Patient, Correct Site, Correct Laterality, Correct Procedure, Correct Position, site marked, Risks and benefits discussed,  Surgical consent,  Pre-op evaluation,  At surgeon's request and post-op pain management  Laterality: Right  Prep: chloraprep       Needles:  Injection technique: Single-shot  Needle Type: Echogenic Stimulator Needle     Needle Length: 9cm 9 cm Needle Gauge: 21 and 21 G    Additional Needles:  Procedures: ultrasound guided (picture in chart) Interscalene brachial plexus block Narrative:  Injection made incrementally with aspirations every 5 mL.  Performed by: Personally  Anesthesiologist: Myrtie Soman MD  Additional Notes: Patient tolerated the procedure well without complications

## 2013-10-23 NOTE — Anesthesia Postprocedure Evaluation (Signed)
  Anesthesia Post-op Note  Patient: Colleen Mckinney  Procedure(s) Performed: Procedure(s) (LRB): RIGHT SHOULDER ARTHROSCOPY WITH SUBACROMIAL DECOMPRESSION, EVULATION UNDER ANESTHESIA  AND MANIPULATION UNDER ANESTHESIA (Right)  Patient Location: PACU  Anesthesia Type: GA combined with regional for post-op pain  Level of Consciousness: awake and alert   Airway and Oxygen Therapy: Patient Spontanous Breathing  Post-op Pain: mild  Post-op Assessment: Post-op Vital signs reviewed, Patient's Cardiovascular Status Stable, Respiratory Function Stable, Patent Airway and No signs of Nausea or vomiting  Last Vitals:  Filed Vitals:   10/23/13 1140  BP: 145/70  Pulse: 70  Temp: 36.5 C  Resp: 20    Post-op Vital Signs: stable   Complications: No apparent anesthesia complications

## 2013-10-23 NOTE — Discharge Instructions (Signed)
SHOULDER ARTHROSCOPY POSTOPERATIVE INSTRUCTIONS FOR DR. Tonita Cong  1.  Ice pack on shoulder 3-4 times per day.  2.  May remove bandages in 72 hours and apply band-aids to sutures.  3,  May get out of sling in AM and start gentle pendulum exercises.  You are encouraged       to move the elbow, wrist and hand.  4.  Elevate operative shoulder and elbow on pillow.  5.  Exercise your fingers to help reduce swelling.  6.  Report to your doctor should any of the following situations occur:   -Swelling of your fingers.  -Inability to wiggle your fingers.  -Coldness, turning pale or blueness of your fingers.  -Loss of sensation, numbness or tingling of your fingers.  -Unusual small or odor from under dressing.  -Excessive bleeding or drainage from the surgical site(s).  -Severe pain which is not relieved by the pain medication your doctor prescribed                for you.  7.  Call the office to schedule and appointment for 14 . 8.  Take one aspirin per day 325mg  with a meal if not on another blood thinner or allergic.  Patient Signature:  ________________________________________________________  Nurse's Signature:  ________________________________________________________  Post Anesthesia Home Care Instructions  Activity: Get plenty of rest for the remainder of the day. A responsible adult should stay with you for 24 hours following the procedure.  For the next 24 hours, DO NOT: -Drive a car -Paediatric nurse -Drink alcoholic beverages -Take any medication unless instructed by your physician -Make any legal decisions or sign important papers.  Meals: Start with liquid foods such as gelatin or soup. Progress to regular foods as tolerated. Avoid greasy, spicy, heavy foods. If nausea and/or vomiting occur, drink only clear liquids until the nausea and/or vomiting subsides. Call your physician if vomiting continues.  Special Instructions/Symptoms: Your throat may feel dry or sore from  the anesthesia or the breathing tube placed in your throat during surgery. If this causes discomfort, gargle with warm salt water. The discomfort should disappear within 24 hours.

## 2013-10-23 NOTE — Interval H&P Note (Signed)
History and Physical Interval Note:  10/23/2013 7:20 AM  [name]  has presented today for surgery, with the diagnosis of RIGHT SHOULDER IMPINGEMENT AND ADHESIVE CAPSULITIES  The various methods of treatment have been discussed with the patient and family. After consideration of risks, benefits and other options for treatment, the patient has consented to  Procedure(s): RIGHT SHOULDER ARTHROSCOPY WITH SUBACROMIAL DECOMPRESSION EVULATION UNDER ANESTHESIA AND MANIPULATION UNDER ANESTHESIA (Right) as a surgical intervention .  The patient's history has been reviewed, patient examined, no change in status, stable for surgery.  I have reviewed the patient's chart and labs.  Questions were answered to the patient's satisfaction.     Colleen Mckinney

## 2013-10-23 NOTE — Progress Notes (Signed)
Short stay phase 2  Pt has right arm in sling. Good CMS of fingers but she still has numbness and no sensation of right arm

## 2013-10-23 NOTE — Anesthesia Preprocedure Evaluation (Signed)
Anesthesia Evaluation  Patient identified by MRN, date of birth, ID band Patient awake    Reviewed: Allergy & Precautions, H&P , NPO status , Patient's Chart, lab work & pertinent test results  Airway Mallampati: II TM Distance: >3 FB Neck ROM: Full    Dental no notable dental hx.    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   negative genitourinary   Musculoskeletal negative musculoskeletal ROS (+)   Abdominal   Peds negative pediatric ROS (+)  Hematology negative hematology ROS (+)   Anesthesia Other Findings   Reproductive/Obstetrics negative OB ROS                           Anesthesia Physical Anesthesia Plan  ASA: II  Anesthesia Plan: General   Post-op Pain Management:    Induction: Intravenous  Airway Management Planned: Oral ETT  Additional Equipment:   Intra-op Plan:   Post-operative Plan: Extubation in OR  Informed Consent: I have reviewed the patients History and Physical, chart, labs and discussed the procedure including the risks, benefits and alternatives for the proposed anesthesia with the patient or authorized representative who has indicated his/her understanding and acceptance.   Dental advisory given  Plan Discussed with: CRNA and Surgeon  Anesthesia Plan Comments:         Anesthesia Quick Evaluation

## 2013-10-23 NOTE — Transfer of Care (Signed)
Immediate Anesthesia Transfer of Care Note  Patient: Colleen Mckinney  Procedure(s) Performed: Procedure(s): RIGHT SHOULDER ARTHROSCOPY WITH SUBACROMIAL DECOMPRESSION, EVULATION UNDER ANESTHESIA  AND MANIPULATION UNDER ANESTHESIA (Right)  Patient Location: PACU  Anesthesia Type:General  Level of Consciousness: awake, alert , oriented and patient cooperative  Airway & Oxygen Therapy: Patient Spontanous Breathing and Patient connected to face mask oxygen  Post-op Assessment: Report given to PACU RN and Post -op Vital signs reviewed and stable  Post vital signs: Reviewed and stable  Complications: No apparent anesthesia complications

## 2013-10-24 ENCOUNTER — Encounter (HOSPITAL_COMMUNITY): Payer: Self-pay | Admitting: Specialist

## 2013-10-30 ENCOUNTER — Ambulatory Visit: Payer: Medicare Other | Attending: Specialist | Admitting: Physical Therapy

## 2013-10-30 DIAGNOSIS — M25519 Pain in unspecified shoulder: Secondary | ICD-10-CM | POA: Insufficient documentation

## 2013-11-04 ENCOUNTER — Ambulatory Visit: Payer: Medicare Other | Attending: Specialist | Admitting: Physical Therapy

## 2013-11-04 DIAGNOSIS — IMO0001 Reserved for inherently not codable concepts without codable children: Secondary | ICD-10-CM | POA: Insufficient documentation

## 2013-11-04 DIAGNOSIS — M25519 Pain in unspecified shoulder: Secondary | ICD-10-CM | POA: Insufficient documentation

## 2013-11-07 ENCOUNTER — Ambulatory Visit: Payer: Medicare Other | Admitting: Physical Therapy

## 2013-11-11 ENCOUNTER — Ambulatory Visit: Payer: Medicare Other | Admitting: Physical Therapy

## 2013-11-17 ENCOUNTER — Ambulatory Visit: Payer: Medicare Other | Admitting: Physical Therapy

## 2013-11-21 ENCOUNTER — Ambulatory Visit: Payer: Medicare Other | Admitting: Physical Therapy

## 2013-11-25 ENCOUNTER — Ambulatory Visit: Payer: Medicare Other | Admitting: Physical Therapy

## 2013-11-28 ENCOUNTER — Ambulatory Visit: Payer: Medicare Other | Admitting: Physical Therapy

## 2013-12-02 ENCOUNTER — Ambulatory Visit: Payer: Medicare Other | Attending: Specialist | Admitting: Physical Therapy

## 2013-12-02 DIAGNOSIS — M25519 Pain in unspecified shoulder: Secondary | ICD-10-CM | POA: Insufficient documentation

## 2013-12-02 DIAGNOSIS — IMO0001 Reserved for inherently not codable concepts without codable children: Secondary | ICD-10-CM | POA: Insufficient documentation

## 2013-12-05 ENCOUNTER — Ambulatory Visit: Payer: Medicare Other | Admitting: Physical Therapy

## 2013-12-09 ENCOUNTER — Ambulatory Visit: Payer: Medicare Other | Admitting: Physical Therapy

## 2013-12-23 ENCOUNTER — Ambulatory Visit: Payer: Medicare Other | Admitting: Physical Therapy

## 2014-01-06 ENCOUNTER — Ambulatory Visit: Payer: Medicare Other | Attending: Specialist | Admitting: Physical Therapy

## 2014-01-06 DIAGNOSIS — IMO0001 Reserved for inherently not codable concepts without codable children: Secondary | ICD-10-CM | POA: Diagnosis not present

## 2014-01-06 DIAGNOSIS — M25519 Pain in unspecified shoulder: Secondary | ICD-10-CM | POA: Diagnosis not present

## 2014-03-03 ENCOUNTER — Encounter: Payer: Self-pay | Admitting: Internal Medicine

## 2014-04-28 ENCOUNTER — Ambulatory Visit (AMBULATORY_SURGERY_CENTER): Payer: Self-pay

## 2014-04-28 VITALS — Ht 64.0 in | Wt 118.8 lb

## 2014-04-28 DIAGNOSIS — Z800 Family history of malignant neoplasm of digestive organs: Secondary | ICD-10-CM

## 2014-04-28 NOTE — Progress Notes (Signed)
Per pt, no allergies to soy or egg products.Pt not taking any weight loss meds or using  O2 at home. 

## 2014-05-12 ENCOUNTER — Ambulatory Visit (AMBULATORY_SURGERY_CENTER): Payer: Medicare Other | Admitting: Internal Medicine

## 2014-05-12 ENCOUNTER — Encounter: Payer: Self-pay | Admitting: Internal Medicine

## 2014-05-12 VITALS — BP 135/72 | HR 64 | Temp 98.5°F | Resp 16 | Ht 64.0 in | Wt 118.0 lb

## 2014-05-12 DIAGNOSIS — D124 Benign neoplasm of descending colon: Secondary | ICD-10-CM

## 2014-05-12 DIAGNOSIS — K635 Polyp of colon: Secondary | ICD-10-CM

## 2014-05-12 DIAGNOSIS — Z1211 Encounter for screening for malignant neoplasm of colon: Secondary | ICD-10-CM

## 2014-05-12 MED ORDER — SODIUM CHLORIDE 0.9 % IV SOLN: 500.0000 mL | INTRAVENOUS | Status: DC

## 2014-05-12 NOTE — Patient Instructions (Addendum)
I found and removed one small polyp. I will let you know pathology results and when/if to have another routine colonoscopy by mail.  I appreciate the opportunity to care for you. Gatha Mayer, MD, FACG   YOU HAD AN ENDOSCOPIC PROCEDURE TODAY AT [facility]: Refer to the procedure report that was given to you for any specific questions about what was found during the examination.  If the procedure report does not answer your questions, please call your gastroenterologist to clarify.  If you requested that your care partner not be given the details of your procedure findings, then the procedure report has been included in a sealed envelope for you to review at your convenience later.  YOU SHOULD EXPECT: Some feelings of bloating in the abdomen. Passage of more gas than usual.  Walking can help get rid of the air that was put into your GI tract during the procedure and reduce the bloating. If you had a lower endoscopy (such as a colonoscopy or flexible sigmoidoscopy) you may notice spotting of blood in your stool or on the toilet paper. If you underwent a bowel prep for your procedure, then you may not have a normal bowel movement for a few days.  DIET: Your first meal following the procedure should be a light meal and then it is ok to progress to your normal diet.  A half-sandwich or bowl of soup is an example of a good first meal.  Heavy or fried foods are harder to digest and may make you feel nauseous or bloated.  Likewise meals heavy in dairy and vegetables can cause extra gas to form and this can also increase the bloating.  Drink plenty of fluids but you should avoid alcoholic beverages for 24 hours.  ACTIVITY: Your care partner should take you home directly after the procedure.  You should plan to take it easy, moving slowly for the rest of the day.  You can resume normal activity the day after the procedure however you should NOT DRIVE or use heavy machinery for 24  hours (because of the sedation medicines used during the test).    SYMPTOMS TO REPORT IMMEDIATELY: A gastroenterologist can be reached at any hour.  During normal business hours, 8:30 AM to 5:00 PM Monday through Friday, call [phone].  After hours and on weekends, please call the GI answering service at [phone] who will take a message and have the physician on call contact you.   Following lower endoscopy (colonoscopy or flexible sigmoidoscopy):  Excessive amounts of blood in the stool  Significant tenderness or worsening of abdominal pains  Swelling of the abdomen that is new, acute  Fever of 100F or higher  FOLLOW UP: If any biopsies were taken you will be contacted by phone or by letter within the next 1-3 weeks.  Call your gastroenterologist if you have not heard about the biopsies in 3 weeks.  Our staff will call the home number listed on your records the next business day following your procedure to check on you and address any questions or concerns that you may have at that time regarding the information given to you following your procedure. This is a courtesy call and so if there is no answer at the home number and we have not heard from you through the emergency physician on call, we will assume that you have returned to your regular daily activities without incident.  SIGNATURES/CONFIDENTIALITY: You and/or your care partner have signed paperwork  which will be entered into your electronic medical record.  These signatures attest to the fact that that the information above on your After Visit Summary has been reviewed and is understood.  Full responsibility of the confidentiality of this discharge information lies with you and/or your care-partner.   INFORMATION ON POLYPS GIVEN TO YOU TODAY

## 2014-05-12 NOTE — Progress Notes (Signed)
Called to room to assist during endoscopic procedure.  Patient ID and intended procedure confirmed with present staff. Received instructions for my participation in the procedure from the performing physician.  

## 2014-05-12 NOTE — Progress Notes (Signed)
Report to PACU, RN, vss, BBS= Clear.  

## 2014-05-12 NOTE — Op Note (Signed)
[place]  [address] [place], [id number]   COLONOSCOPY PROCEDURE REPORT  PATIENT: Colleen Mckinney, Colleen Mckinney  MR#: [id number] BIRTHDATE: [birth year] , 63  yrs. old GENDER: female ENDOSCOPIST: Gatha Mayer, MD, [facility] PROCEDURE DATE:  05/12/2014 PROCEDURE:   Colonoscopy with snare polypectomy First Screening Colonoscopy - Avg.  risk and is 50 yrs.  old or older - No.  Prior Negative Screening - Now for repeat screening. 10 or more years since last screening  History of Adenoma - Now for follow-up colonoscopy & has been > or = to 3 yrs.  N/A  Polyps Removed Today? Yes. ASA CLASS:   Class II INDICATIONS:average risk for colorectal cancer. MEDICATIONS: Propofol 200 mg IV and Monitored anesthesia care  DESCRIPTION OF PROCEDURE:   After the risks benefits and alternatives of the procedure were thoroughly explained, informed consent was obtained.  The digital rectal exam revealed no abnormalities of the rectum.   The [facility] [id number]  endoscope was introduced through the anus and advanced to the cecum, which was identified by both the appendix and ileocecal valve. No adverse events experienced.   The quality of the prep was good, using MiraLax  The instrument was then slowly withdrawn as the colon was fully examined.      COLON FINDINGS: A flat polyp measuring 6 mm in size was found in the descending colon.  A polypectomy was performed with a cold snare. The resection was complete, the polyp tissue was completely retrieved and sent to histology.   The examination was otherwise normal.  Retroflexed views revealed no abnormalities. The time to cecum=4 minutes 56 seconds.  Withdrawal time=12 minutes 15 seconds. The scope was withdrawn and the procedure completed. COMPLICATIONS: There were no immediate complications.  ENDOSCOPIC IMPRESSION: 1.   Flat polyp measuring 6 mm in size was found in the descending colon; polypectomy was performed with a cold snare 2.   The  examination was otherwise normal - good prep - second screening exam  RECOMMENDATIONS: Timing of repeat colonoscopy will be determined by pathology findings.  eSigned:  Gatha Mayer, MD, [facility] 05/12/2014 11:40 AM   cc: The Patient

## 2014-05-13 ENCOUNTER — Telehealth: Payer: Self-pay

## 2014-05-13 NOTE — Telephone Encounter (Signed)
  Follow up Call-  Call back number 05/12/2014  Post procedure Call Back phone  # [phone]  Permission to leave phone message Yes     Patient questions:  Do you have a fever, pain , or abdominal swelling? No. Pain Score  0 *  Have you tolerated food without any problems? Yes.    Have you been able to return to your normal activities? Yes.    Do you have any questions about your discharge instructions: Diet   No. Medications  No. Follow up visit  No.  Do you have questions or concerns about your Care? No.  Actions: * If pain score is 4 or above: No action needed, pain <4.  No problems per the pt. maw

## 2014-05-19 ENCOUNTER — Encounter: Payer: Self-pay | Admitting: Internal Medicine

## 2014-05-19 NOTE — Progress Notes (Signed)
Quick Note:  Hyperplastic polyp Repeat colon as needed no recall ______

## 2014-12-28 ENCOUNTER — Other Ambulatory Visit: Payer: Self-pay

## 2015-07-02 ENCOUNTER — Ambulatory Visit (HOSPITAL_BASED_OUTPATIENT_CLINIC_OR_DEPARTMENT_OTHER)
Admission: RE | Admit: 2015-07-02 | Discharge: 2015-07-02 | Disposition: A | Discharge status: Home / Self Care | Payer: Medicare Other | Source: Ambulatory Visit | Attending: Family Medicine | Admitting: Family Medicine

## 2015-07-02 ENCOUNTER — Encounter: Payer: Self-pay | Admitting: Family Medicine

## 2015-07-02 ENCOUNTER — Ambulatory Visit (INDEPENDENT_AMBULATORY_CARE_PROVIDER_SITE_OTHER): Payer: Medicare Other | Admitting: Family Medicine

## 2015-07-02 VITALS — BP 110/70 | HR 74 | Temp 98.1°F | Ht 63.0 in | Wt 116.4 lb

## 2015-07-02 DIAGNOSIS — R0781 Pleurodynia: Secondary | ICD-10-CM | POA: Diagnosis not present

## 2015-07-02 DIAGNOSIS — R05 Cough: Secondary | ICD-10-CM

## 2015-07-02 DIAGNOSIS — R059 Cough, unspecified: Secondary | ICD-10-CM

## 2015-07-02 NOTE — Patient Instructions (Signed)

## 2015-07-02 NOTE — Progress Notes (Signed)
Patient ID: Colleen Mckinney, female    DOB: [birth year]  Age: 69 y.o. MRN: [id number]    Subjective:  Subjective HPI Colleen Mckinney presents for R rib pain.  Pt had uri with cough about mid nov and cough never completely went away but by 1st dec was sig better.  Pain in R rib worsened with sneezing.   Review of Systems  Constitutional: Negative for diaphoresis, appetite change, fatigue and unexpected weight change.  Eyes: Negative for pain, redness and visual disturbance.  Respiratory: Negative for cough, chest tightness, shortness of breath and wheezing.        R rib pain with breathing only  Cardiovascular: Negative for chest pain, palpitations and leg swelling.  Endocrine: Negative for cold intolerance, heat intolerance, polydipsia, polyphagia and polyuria.  Genitourinary: Negative for dysuria, frequency and difficulty urinating.  Neurological: Negative for dizziness, light-headedness, numbness and headaches.    History Past Medical History  Diagnosis Date  . 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 Left shoulder pain   . Anxiety     claustrophobia, fear of heights  . Complication of anesthesia     remembers severe sore throat after kidney surgery and feeling dizzy  . Skin lesion     "pre-cancer" -past hx.    She has past surgical history that includes Nephrectomy (01/1998); Hysteroscopy; Shoulder arthroscopy (Left); Cervix surgery; Breast surgery (Right); Shoulder arthroscopy with subacromial decompression (Right, 10/23/2013); and Colonoscopy.   Her family history includes Cancer (age of onset: 80) in her father and mother; Colon cancer in her maternal grandfather; Heart disease in her father and mother; Hyperlipidemia in her father, mother, and sister; Hypertension in her father and mother; Osteopenia in her mother.She reports that she has never smoked. She has never used smokeless tobacco. She reports that she drinks about 0.6 oz of alcohol per week. She reports that  she does not use illicit drugs.  Current Outpatient Prescriptions on File Prior to Visit  Medication Sig Dispense Refill  . Calcium Carbonate-Vitamin D (CALCIUM + D PO) Take 1 tablet by mouth daily.     Marland Kitchen ibuprofen (ADVIL,MOTRIN) 200 MG tablet Take 200 mg by mouth every 6 (six) hours as needed. Pain      No current facility-administered medications on file prior to visit.     Objective:  Objective Physical Exam  Constitutional: She is oriented to person, place, and time. She appears well-developed and well-nourished.  HENT:  Head: Normocephalic and atraumatic.  Eyes: Conjunctivae and EOM are normal.  Neck: Normal range of motion. Neck supple. No JVD present. Carotid bruit is not present. No thyromegaly present.  Cardiovascular: Normal rate, regular rhythm and normal heart sounds.   No murmur heard. Pulmonary/Chest: Effort normal and breath sounds normal. No respiratory distress. She has no wheezes. She has no rales. She exhibits tenderness.    Musculoskeletal: She exhibits no edema.  Neurological: She is alert and oriented to person, place, and time.  Psychiatric: She has a normal mood and affect.  Nursing note and vitals reviewed.  BP 110/70 mmHg  Pulse 74  Temp(Src) 98.1 F (36.7 C) (Oral)  Ht 5\' 3"  (1.6 m)  Wt 116 lb 6.4 oz (52.799 kg)  BMI 20.62 kg/m2  SpO2 99% Wt Readings from Last 3 Encounters:  07/02/15 116 lb 6.4 oz (52.799 kg)  05/12/14 118 lb (53.524 kg)  04/28/14 118 lb 12.8 oz (53.887 kg)     Lab Results  Component Value Date  WBC 6.0 08/14/2011   HGB 14.1 08/14/2011   HCT 39.9 08/14/2011   PLT 273 08/14/2011   GLUCOSE 92 08/14/2011   CHOL 180 04/27/2011   TRIG 55.0 04/27/2011   HDL 81.00 04/27/2011   LDLCALC 88 04/27/2011   ALT 16 04/27/2011   AST 23 04/27/2011   NA 142 08/14/2011   K 4.8 08/14/2011   CL 104 08/14/2011   CREATININE 0.97 08/14/2011   BUN 19 08/14/2011   CO2 30 08/14/2011   TSH 1.62 05/18/2006    No results found.     Assessment & Plan:  Plan I have discontinued Ms. Weld's Ibuprofen-Diphenhydramine Cit (IBUPROFEN PM PO), methocarbamol, HYDROcodone-acetaminophen, docusate sodium, bisacodyl, and polyethylene glycol powder. I am also having her maintain her Calcium Carbonate-Vitamin D (CALCIUM + D PO) and ibuprofen.  No orders of the defined types were placed in this encounter.    Problem List Items Addressed This Visit    Rib pain on right side    Suspect possible fracture from coughing Check xray If + --- brace with pillow when coughing Pt does not think she needs pain med rto prn      Relevant Orders   DG Ribs Unilateral W/Chest Right    Other Visit Diagnoses    Cough    -  Primary       Follow-up: Return if symptoms worsen or fail to improve.  Garnet Koyanagi, DO

## 2015-07-02 NOTE — Assessment & Plan Note (Signed)
Suspect possible fracture from coughing Check xray If + --- brace with pillow when coughing Pt does not think she needs pain med rto prn

## 2015-07-02 NOTE — Progress Notes (Signed)
Pre visit review using our clinic review tool, if applicable. No additional management support is needed unless otherwise documented below in the visit note. 

## 2015-07-27 DIAGNOSIS — Z130 Encounter for screening for diseases of the blood and blood-forming organs and certain disorders involving the immune mechanism: Secondary | ICD-10-CM | POA: Diagnosis not present

## 2015-07-27 DIAGNOSIS — Z0000 Encounter for general adult medical examination without abnormal findings: Secondary | ICD-10-CM | POA: Diagnosis not present

## 2015-07-27 DIAGNOSIS — Z1231 Encounter for screening mammogram for malignant neoplasm of breast: Secondary | ICD-10-CM | POA: Diagnosis not present

## 2015-07-27 DIAGNOSIS — G4700 Insomnia, unspecified: Secondary | ICD-10-CM | POA: Diagnosis not present

## 2015-07-27 DIAGNOSIS — Z13220 Encounter for screening for lipoid disorders: Secondary | ICD-10-CM | POA: Diagnosis not present

## 2015-07-27 DIAGNOSIS — Z1329 Encounter for screening for other suspected endocrine disorder: Secondary | ICD-10-CM | POA: Diagnosis not present

## 2015-07-27 DIAGNOSIS — Z01419 Encounter for gynecological examination (general) (routine) without abnormal findings: Secondary | ICD-10-CM | POA: Diagnosis not present

## 2015-07-27 DIAGNOSIS — Z131 Encounter for screening for diabetes mellitus: Secondary | ICD-10-CM | POA: Diagnosis not present

## 2015-08-02 DIAGNOSIS — M85851 Other specified disorders of bone density and structure, right thigh: Secondary | ICD-10-CM | POA: Diagnosis not present

## 2015-08-02 DIAGNOSIS — Z8262 Family history of osteoporosis: Secondary | ICD-10-CM | POA: Diagnosis not present

## 2015-08-11 DIAGNOSIS — M8580 Other specified disorders of bone density and structure, unspecified site: Secondary | ICD-10-CM | POA: Diagnosis not present

## 2015-09-01 ENCOUNTER — Telehealth: Payer: Self-pay

## 2015-09-01 NOTE — Telephone Encounter (Signed)
Received the results from the Bone Density Scan from Dr.Fogleman and the patient's Osteopenia is slightly worst. Dr.Lowne would like for the patient to continue Calcium and Vitamin D but add Fosamax 70 mg weekly and recheck in 2 years. Message left to call and discuss.    KP

## 2015-09-02 NOTE — Telephone Encounter (Signed)
Spoke with patient and Dr.Fogleman is treating her with Fosamax 70 mg already.    KP

## 2015-10-07 ENCOUNTER — Ambulatory Visit: Payer: Self-pay | Admitting: Family Medicine

## 2016-05-22 ENCOUNTER — Telehealth: Payer: Self-pay

## 2016-05-22 ENCOUNTER — Telehealth: Payer: Self-pay | Admitting: Family Medicine

## 2016-05-22 NOTE — Telephone Encounter (Signed)
°  Relation to [id number] Call back Soper:  Reason for call: pt would like to know if Dr.Lowne has had a chance to review her x-rays that she left on Wednesday from her dentist. States that it showed calcium on her artery and her dentist suggested that she informed her primary care. Please cal back.  Pt requesting call back by the end of the day today

## 2016-05-22 NOTE — Telephone Encounter (Signed)
Spoke with pt, informed pt she needs a follow up with PCP as well as an Well Visit with nurses, per pcp. Set up appointment for both follow up appointment and Well visit. Pt had no further questions or concerns. LB

## 2016-05-23 DIAGNOSIS — I6521 Occlusion and stenosis of right carotid artery: Secondary | ICD-10-CM | POA: Diagnosis not present

## 2016-05-29 DIAGNOSIS — I6521 Occlusion and stenosis of right carotid artery: Secondary | ICD-10-CM | POA: Diagnosis not present

## 2016-06-06 ENCOUNTER — Ambulatory Visit: Payer: Medicare Other

## 2016-06-12 ENCOUNTER — Telehealth: Payer: Self-pay | Admitting: Family Medicine

## 2016-06-12 NOTE — Telephone Encounter (Signed)
lvm advising patient to [facility] her medicare wellness appointment

## 2016-06-29 ENCOUNTER — Ambulatory Visit: Payer: Medicare Other | Admitting: Family Medicine

## 2016-07-25 NOTE — Telephone Encounter (Signed)
Enter error. LB 

## 2017-04-24 ENCOUNTER — Other Ambulatory Visit: Payer: Self-pay | Admitting: Obstetrics

## 2017-04-24 DIAGNOSIS — R1900 Intra-abdominal and pelvic swelling, mass and lump, unspecified site: Secondary | ICD-10-CM

## 2017-04-24 DIAGNOSIS — R1084 Generalized abdominal pain: Secondary | ICD-10-CM

## 2017-04-25 ENCOUNTER — Ambulatory Visit
Admission: RE | Admit: 2017-04-25 | Discharge: 2017-04-25 | Disposition: A | Discharge status: Home / Self Care | Payer: Medicare Other | Source: Ambulatory Visit | Attending: Obstetrics | Admitting: Obstetrics

## 2017-04-25 DIAGNOSIS — R1900 Intra-abdominal and pelvic swelling, mass and lump, unspecified site: Secondary | ICD-10-CM

## 2017-04-25 DIAGNOSIS — R1084 Generalized abdominal pain: Secondary | ICD-10-CM

## 2017-04-25 MED ORDER — IOPAMIDOL (ISOVUE-300) INJECTION 61%
100.0000 mL | Freq: Once | INTRAVENOUS | Status: AC | PRN
  Administered 2017-04-25: 100 mL via INTRAVENOUS

## 2017-04-26 ENCOUNTER — Encounter: Payer: Self-pay | Admitting: Gynecologic Oncology

## 2017-04-26 ENCOUNTER — Ambulatory Visit: Payer: Medicare Other | Attending: Gynecologic Oncology | Admitting: Gynecologic Oncology

## 2017-04-26 VITALS — BP 134/70 | HR 93 | Temp 97.7°F | Resp 20 | Ht 64.0 in | Wt 106.1 lb

## 2017-04-26 DIAGNOSIS — C787 Secondary malignant neoplasm of liver and intrahepatic bile duct: Secondary | ICD-10-CM | POA: Diagnosis not present

## 2017-04-26 DIAGNOSIS — Z79899 Other long term (current) drug therapy: Secondary | ICD-10-CM | POA: Diagnosis not present

## 2017-04-26 DIAGNOSIS — Z803 Family history of malignant neoplasm of breast: Secondary | ICD-10-CM

## 2017-04-26 DIAGNOSIS — Z8052 Family history of malignant neoplasm of bladder: Secondary | ICD-10-CM

## 2017-04-26 DIAGNOSIS — R971 Elevated cancer antigen 125 [CA 125]: Secondary | ICD-10-CM

## 2017-04-26 DIAGNOSIS — C786 Secondary malignant neoplasm of retroperitoneum and peritoneum: Secondary | ICD-10-CM | POA: Diagnosis not present

## 2017-04-26 DIAGNOSIS — C801 Malignant (primary) neoplasm, unspecified: Secondary | ICD-10-CM

## 2017-04-26 DIAGNOSIS — Z8042 Family history of malignant neoplasm of prostate: Secondary | ICD-10-CM | POA: Diagnosis not present

## 2017-04-26 DIAGNOSIS — Z905 Acquired absence of kidney: Secondary | ICD-10-CM

## 2017-04-26 DIAGNOSIS — Z8249 Family history of ischemic heart disease and other diseases of the circulatory system: Secondary | ICD-10-CM | POA: Insufficient documentation

## 2017-04-26 DIAGNOSIS — C482 Malignant neoplasm of peritoneum, unspecified: Secondary | ICD-10-CM

## 2017-04-26 DIAGNOSIS — Z800 Family history of malignant neoplasm of digestive organs: Secondary | ICD-10-CM

## 2017-04-26 DIAGNOSIS — Z9889 Other specified postprocedural states: Secondary | ICD-10-CM | POA: Diagnosis not present

## 2017-04-26 DIAGNOSIS — R1900 Intra-abdominal and pelvic swelling, mass and lump, unspecified site: Secondary | ICD-10-CM

## 2017-04-26 NOTE — Patient Instructions (Signed)
You will receive a phone call from the Radiology Department at the Hospital to arrange for you to come in for a biopsy under CT or ultrasound guidance.   We will also arrange for you to meet with Dr. Alvy Bimler, Medical Oncologist, to discuss and arrange chemotherapy.

## 2017-04-26 NOTE — Progress Notes (Signed)
Consult Note: Gyn-Onc  Consult was requested by Dr.Fogleman for the evaluation of Colleen Mckinney 71 y.o. female  CC:  Chief Complaint  Patient presents with  . Pelvic mass    Assessment/Plan:  Colleen Mckinney is a 71 y.o. 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  The importance of chemotherapy and optimal cytoreduction was discussed with the patient and her children.  My recommendation is that she be treated with 3 cycles of Taxol carboplatin therapy followed by interval debulking then an additional 3 cycles of therapy.  Consideration will be strongly made for maintenance treatment with PARP inhibitors. Neoadjuvant chemotherapy is recommended because of notable areas of mesenteric involvement which may make optimal cytoreduction not feasible.  Referral for CT-guided biopsy for confirmation of histology and consultation with Dr. Heath Lark.  Follow-up after cycle 2 of chemotherapy with GYN oncology to assess for interval debulking.  The questions of Colleen Mckinney and her children were answered to their satisfaction.  Will refer for genetic testing.   HPI: Colleen Mckinney is a 71 y.o. gravida 5 para 4 who Mckinney a gradual sensation of pelvic heaviness which she thought might be related to genital prolapse.  She presented for evaluation of an ultrasound was performed which demonstrated a uterus measuring 6 x 5 cm with a 1.3 mm endometrial stripe.  Multiple heterogeneous pelvic masses were appreciated the right measured 8 x 5 cm and another measured 10 cm just posterior to the uterus CA-125 was obtained and returned a value of 404.8.  CT scan was obtained on April 25, 2017 and is notable for a capsular mass along the dome of the right hepatic lobe measuring 2.5 x 2.3 x 1.4 cm.  There are several tiny hypodense lesion lateral segment of the left  hepatic lobe probably benign cysts.  The right kidney is surgically absent dorsal to the uterus there is a 12.7 x 7.1 x 7.3 cm primarily solid mass with cystic elements the mass is intimately associated with the uterus but also abuts the rectosigmoid extensive and omental caking was appreciated most notably in the left abdomen measuring up to 2.9 cm in thickness there was scattered tumor in the mesentery in the right colon mesentery was noted to have a 2.9 x 2.2 cm mesenteric mass.  The appearance is compatible with widespread peritoneal and omental metastatic disease.  Colleen Mckinney early satiety for approximately 2 months and a 10 pound weight loss over the last year.  She also Mckinney mild abdominal bloating and sensation of pelvic heaviness.  Her family history is notable for mother diagnosed with breast cancer at the age of 25 and maternal grandfather with colon cancer and a father who was diagnosed with bladder cancer in his 52s and prostate cancer in the 76s.  She underwent a colonoscopy approximately 3 years ago and is up-to-date on mammography.  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  Review of Systems:  Constitutional  Feels well, 10 pound weight loss in the last year Cardiovascular  No chest pain, shortness of breath, or edema  Pulmonary  No cough or wheeze.  Gastro Intestinal  No nausea, vomitting, or diarrhoea. No bright red blood per rectum, no abdominal pain, ports an episode of diarrhea last week change in bowel movement, or constipation early satiety for 2  months.  Genito Urinary  No frequency, urgency, dysuria, denies abnormal uterine bleeding, Mckinney pelvic pressure Musculo Skeletal  No myalgia, arthralgia, joint swelling Mckinney twinges of the anterior abdominal wall Neurologic  No weakness, numbness, change in gait,  Psychology  No depression, anxiety, insomnia.    Current Meds:  Outpatient Encounter Prescriptions as  of 04/26/2017  Medication Sig  . acetaminophen (TYLENOL) 500 MG tablet Take 500 mg by mouth every 6 (six) hours as needed.  Marland Kitchen alendronate (FOSAMAX) 70 MG tablet Take 70 mg by mouth once a week. Take with a full glass of water on an empty stomach.  . Calcium Carbonate-Vitamin D (CALCIUM + D PO) Take 1 tablet by mouth daily.   Marland Kitchen ibuprofen (ADVIL,MOTRIN) 200 MG tablet Take 200 mg by mouth every 6 (six) hours as needed. Pain    No facility-administered encounter medications on file as of 04/26/2017.     Allergy: No Known Allergies  Social Hx:   Social History   Social History  . Marital status: Divorced    Spouse name: N/A  . Number of children: N/A  . Years of education: N/A   Occupational History  . retired-- Manufacturing engineer    Social History Main Topics  . Smoking status: Never Smoker  . Smokeless tobacco: Never Used  . Alcohol use 0.6 oz/week    1 Cans of beer per week  . Drug use: No  . Sexual activity: Not Currently    Partners: Male   Other Topics Concern  . Not on file   Social History Narrative  . No narrative on file    Past Surgical Hx:  Past Surgical History:  Procedure Laterality Date  . BREAST SURGERY Right    lumpectomy-benign  . CERVIX SURGERY     "freezing" dysplasia  . COLONOSCOPY    . HYSTEROSCOPY    . NEPHRECTOMY  01/1998   right--donation  . SHOULDER ARTHROSCOPY Left    "impingement" "frozen shoulder"  . SHOULDER ARTHROSCOPY WITH SUBACROMIAL DECOMPRESSION Right 10/23/2013   Procedure: RIGHT SHOULDER ARTHROSCOPY WITH SUBACROMIAL DECOMPRESSION, EVULATION UNDER ANESTHESIA  AND MANIPULATION UNDER ANESTHESIA;  Surgeon: Johnn Hai, MD;  Location: [facility];  Service: Orthopedics;  Laterality: Right;    Past Medical Hx:  Past Medical History:  Diagnosis Date  . Anxiety    claustrophobia, fear of heights  . Complication of anesthesia    remembers severe sore throat after kidney surgery and feeling dizzy  . Kidney donor    pt donated one  kidney-the right  . Left shoulder pain   . Skin lesion    "pre-cancer" -past hx.    Past Gynecological History: Gravida 5 para 4 menopause occurred 16 years ago after hysteroscopy for abnormal bleeding remote history of abnormal Pap tests treated with cryotherapy  Family Hx:  Family History  Problem Relation Age of Onset  . Heart disease Mother        mitral valve prolapse  . Hypertension Mother   . Hyperlipidemia Mother   . Cancer Mother 63       breast  . Osteopenia Mother   . Heart disease Father        bypass  . Hypertension Father   . Hyperlipidemia Father   . Cancer Father 40       bladder,  prostate  . Hyperlipidemia Sister   . Colon cancer Maternal Grandfather     Vitals:  Blood pressure 134/70, pulse 93, temperature 97.7 F (36.5 C), temperature source Oral, resp.  rate 20, height 5\' 4"  (1.626 m), weight 106 lb 1.6 oz (48.1 kg), SpO2 100 %.  Physical Exam: WD in NAD Neck  Supple NROM, without any enlargements.  Lymph Node Survey No cervical supraclavicular or inguinal adenopathy Cardiovascular  Pulse normal rate, regularity and rhythm. S1 and S2 normal.  Lungs  Clear to auscultation bilaterally, without wheezes/crackles/rhonchi. Good air movement.  Skin  No rash/lesions/breakdown  Psychiatry  Alert and oriented appropriate mood affect speech and reasoning. Abdomen  Normoactive bowel sounds, abdomen soft, non-tender.  Palpable omental cake Back No CVA tenderness Genito Urinary  Vulva/vagina: Normal external female genitalia.  No lesions. No discharge or bleeding.  Bladder/urethra:  No lesions or masses  Vagina: Atrophic  Cervix: Normal appearing, displaced anteriorly   Uterus: Fixed to the pelvic mass no parametrial involvement or nodularity.  Adnexa: 14 cm midline fixed cul-de-sac mass Rectal  Good tone, no masses pelvic mass appreciated no intraluminal masses  Extremities  No bilateral cyanosis, clubbing or edema.   Janie Morning, MD,  PhD 04/26/2017, 5:36 PM

## 2017-04-30 ENCOUNTER — Other Ambulatory Visit: Payer: Self-pay | Admitting: General Surgery

## 2017-05-01 ENCOUNTER — Encounter (HOSPITAL_COMMUNITY): Payer: Self-pay

## 2017-05-01 ENCOUNTER — Ambulatory Visit (HOSPITAL_COMMUNITY)
Admission: RE | Admit: 2017-05-01 | Discharge: 2017-05-01 | Disposition: A | Discharge status: Home / Self Care | Payer: Medicare Other | Source: Ambulatory Visit | Attending: Gynecologic Oncology | Admitting: Gynecologic Oncology

## 2017-05-01 DIAGNOSIS — C569 Malignant neoplasm of unspecified ovary: Secondary | ICD-10-CM | POA: Insufficient documentation

## 2017-05-01 DIAGNOSIS — Z803 Family history of malignant neoplasm of breast: Secondary | ICD-10-CM | POA: Diagnosis not present

## 2017-05-01 DIAGNOSIS — R1900 Intra-abdominal and pelvic swelling, mass and lump, unspecified site: Secondary | ICD-10-CM | POA: Diagnosis present

## 2017-05-01 DIAGNOSIS — Z8042 Family history of malignant neoplasm of prostate: Secondary | ICD-10-CM | POA: Insufficient documentation

## 2017-05-01 DIAGNOSIS — Z8249 Family history of ischemic heart disease and other diseases of the circulatory system: Secondary | ICD-10-CM | POA: Insufficient documentation

## 2017-05-01 DIAGNOSIS — C786 Secondary malignant neoplasm of retroperitoneum and peritoneum: Secondary | ICD-10-CM | POA: Diagnosis not present

## 2017-05-01 DIAGNOSIS — Z905 Acquired absence of kidney: Secondary | ICD-10-CM | POA: Diagnosis not present

## 2017-05-01 DIAGNOSIS — Z8052 Family history of malignant neoplasm of bladder: Secondary | ICD-10-CM | POA: Insufficient documentation

## 2017-05-01 DIAGNOSIS — F419 Anxiety disorder, unspecified: Secondary | ICD-10-CM | POA: Insufficient documentation

## 2017-05-01 LAB — PROTIME-INR
INR: 1.05
PROTHROMBIN TIME: 13.6 s (ref 11.4–15.2)

## 2017-05-01 LAB — CBC
HCT: 39.2 % (ref 36.0–46.0)
HEMOGLOBIN: 13.3 g/dL (ref 12.0–15.0)
MCH: 30.2 pg (ref 26.0–34.0)
MCHC: 33.9 g/dL (ref 30.0–36.0)
MCV: 88.9 fL (ref 78.0–100.0)
Platelets: 545 10*3/uL — ABNORMAL HIGH (ref 150–400)
RBC: 4.41 MIL/uL (ref 3.87–5.11)
RDW: 12.3 % (ref 11.5–15.5)
WBC: 9.8 10*3/uL (ref 4.0–10.5)

## 2017-05-01 MED ORDER — SODIUM CHLORIDE 0.9 % IV SOLN
INTRAVENOUS | Status: AC | PRN
  Administered 2017-05-01: 10 mL/h via INTRAVENOUS

## 2017-05-01 MED ORDER — SODIUM CHLORIDE 0.9 % IV SOLN: INTRAVENOUS | Status: DC

## 2017-05-01 MED ORDER — MIDAZOLAM HCL 2 MG/2ML IJ SOLN
INTRAMUSCULAR | Status: AC | PRN
  Administered 2017-05-01: 0.5 mg via INTRAVENOUS
  Administered 2017-05-01: 1 mg via INTRAVENOUS

## 2017-05-01 MED ORDER — MIDAZOLAM HCL 2 MG/2ML IJ SOLN
INTRAMUSCULAR | Status: AC
  Filled 2017-05-01: qty 4

## 2017-05-01 MED ORDER — LIDOCAINE HCL 1 % IJ SOLN
INTRAMUSCULAR | Status: AC
  Filled 2017-05-01: qty 20

## 2017-05-01 MED ORDER — FENTANYL CITRATE (PF) 100 MCG/2ML IJ SOLN
INTRAMUSCULAR | Status: AC | PRN
  Administered 2017-05-01: 50 ug via INTRAVENOUS
  Administered 2017-05-01: 25 ug via INTRAVENOUS

## 2017-05-01 MED ORDER — FENTANYL CITRATE (PF) 100 MCG/2ML IJ SOLN
INTRAMUSCULAR | Status: AC
  Filled 2017-05-01: qty 4

## 2017-05-01 NOTE — Procedures (Signed)
Omental mass  S/p CT omental mass bx  No comp Stable Path pending Full report in pacs

## 2017-05-01 NOTE — Discharge Instructions (Addendum)
Moderate Conscious Sedation, Adult, Care After  These instructions provide you with information about caring for yourself after your procedure. Your health care provider may also give you more specific instructions. Your treatment has been planned according to current medical practices, but problems sometimes occur. Call your health care provider if you have any problems or questions after your procedure.  What can I expect after the procedure?  After your procedure, it is common:  · To feel sleepy for several hours.  · To feel clumsy and have poor balance for several hours.  · To have poor judgment for several hours.  · To vomit if you eat too soon.    Follow these instructions at home:  For at least 24 hours after the procedure:    · Do not:  ? Participate in activities where you could fall or become injured.  ? Drive.  ? Use heavy machinery.  ? Drink alcohol.  ? Take sleeping pills or medicines that cause drowsiness.  ? Make important decisions or sign legal documents.  ? Take care of children on your own.  · Rest.  Eating and drinking  · Follow the diet recommended by your health care provider.  · If you vomit:  ? Drink water, juice, or soup when you can drink without vomiting.  ? Make sure you have little or no nausea before eating solid foods.  General instructions  · Have a responsible adult stay with you until you are awake and alert.  · Take over-the-counter and prescription medicines only as told by your health care provider.  · If you smoke, do not smoke without supervision.  · Keep all follow-up visits as told by your health care provider. This is important.  Contact a health care provider if:  · You keep feeling nauseous or you keep vomiting.  · You feel light-headed.  · You develop a rash.  · You have a fever.  Get help right away if:  · You have trouble breathing.  This information is not intended to replace advice given to you by your health care provider. Make sure you discuss any questions you have  with your health care provider.  Document Released: 04/09/2013 Document Revised: 11/22/2015 Document Reviewed: 10/09/2015  Elsevier Interactive Patient Education © 2018 [employer]  Needle Biopsy, Care After  Refer to this sheet in the next few weeks. These instructions provide you with information about caring for yourself after your procedure. Your health care provider may also give you more specific instructions. Your treatment has been planned according to current medical practices, but problems sometimes occur. Call your health care provider if you have any problems or questions after your procedure.  What can I expect after the procedure?  After your procedure, it is common to have soreness, bruising, or mild pain at the biopsy site. This should go away in a few days.  Follow these instructions at home:  · Rest as directed by your health care provider.  · Take medicines only as directed by your health care provider.  · There are many different ways to close and cover the biopsy site, including stitches (sutures), skin glue, and adhesive strips. Follow your health care provider's instructions about:  ? Biopsy site care.  ? Bandage (dressing) changes and removal.  ? Biopsy site closure removal.  · Check your biopsy site every day for signs of infection. Watch for:  ? Redness, swelling, or pain.  ? Fluid, blood, or pus.  Contact a health   care provider if:  · You have a fever.  · You have redness, swelling, or pain at the biopsy site that lasts longer than a few days.  · You have fluid, blood, or pus coming from the biopsy site.  · You feel nauseous.  · You vomit.  Get help right away if:  · You have shortness of breath.  · You have trouble breathing.  · You have chest pain.  · You feel dizzy or you faint.  · You have bleeding that does not stop with pressure or a bandage.  · You cough up blood.  · You have pain in your abdomen.  This information is not intended to replace advice given to you by your health care  provider. Make sure you discuss any questions you have with your health care provider.  Document Released: 11/03/2014 Document Revised: 11/25/2015 Document Reviewed: 06/15/2014  Elsevier Interactive Patient Education © 2018 [employer]

## 2017-05-01 NOTE — H&P (Signed)
Chief Complaint: omental nodule  Referring Physician:Dr. Janie Morning   Supervising Physician: Daryll Brod  Patient Status: [facility] - Out-pt  HPI: Colleen Mckinney is a 71 y.o. female who has been having some abdominal heaviness and bloating for several weeks and ultimately longer if she really thinks retrospectively.  She saw her GYN and had an [place] that revealed an ovarian mass.  She then underwent a CT scan that reveals ovarian malignancy with widespread peritoneal, mesenteric, and omental disease. A request has been made for a biopsy to confirm a diagnosis.  Past Medical History:  Past Medical History:  Diagnosis Date  . Anxiety    claustrophobia, fear of heights  . Complication of anesthesia    remembers severe sore throat after kidney surgery and feeling dizzy  . 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 Left shoulder pain   . Skin lesion    "pre-cancer" -past hx.    Past Surgical History:  Past Surgical History:  Procedure Laterality Date  . BREAST SURGERY Right    lumpectomy-benign  . CERVIX SURGERY     "freezing" dysplasia  . COLONOSCOPY    . HYSTEROSCOPY    . NEPHRECTOMY  01/1998   right--donation  . SHOULDER ARTHROSCOPY Left    "impingement" "frozen shoulder"  . SHOULDER ARTHROSCOPY WITH SUBACROMIAL DECOMPRESSION Right 10/23/2013   Procedure: RIGHT SHOULDER ARTHROSCOPY WITH SUBACROMIAL DECOMPRESSION, EVULATION UNDER ANESTHESIA  AND MANIPULATION UNDER ANESTHESIA;  Surgeon: Johnn Hai, MD;  Location: [facility];  Service: Orthopedics;  Laterality: Right;    Family History:  Family History  Problem Relation Age of Onset  . Heart disease Mother        mitral valve prolapse  . Hypertension Mother   . Hyperlipidemia Mother   . Cancer Mother 40       breast  . Osteopenia Mother   . Heart disease Father        bypass  . Hypertension Father   . Hyperlipidemia Father   . Cancer Father 56       bladder,  prostate  . Hyperlipidemia Sister   . Colon  cancer Maternal Grandfather     Social History:  reports that she has never smoked. She has never used smokeless tobacco. She reports that she drinks about 0.6 oz of alcohol per week . She reports that she does not use drugs.  Allergies: No Known Allergies  Medications: Medications reviewed in epic  Please HPI for pertinent positives, otherwise complete 10 system ROS negative.  Mallampati Score: MD Evaluation Airway: WNL Heart: WNL Abdomen: WNL Chest/ Lungs: WNL ASA  Classification: 2 Mallampati/Airway Score: Two  Physical Exam: BP 118/68   Pulse 84   Temp 98 F (36.7 C) (Oral)   Resp 16   Ht 5\' 4"  (1.626 m)   Wt 106 lb (48.1 kg)   SpO2 100%   BMI 18.19 kg/m  Body mass index is 18.19 kg/m. General: pleasant, WD, WN white female who is laying in bed in NAD HEENT: head is normocephalic, atraumatic.  Sclera are noninjected.  PERRL.  Ears and nose without any masses or lesions.  Mouth is pink and moist Heart: regular, rate, and rhythm.  Normal s1,s2. No obvious murmurs, gallops, or rubs noted.  Palpable radial and pedal pulses bilaterally Lungs: CTAB, no wheezes, rhonchi, or rales noted.  Respiratory effort nonlabored Abd: soft, NT, ND, +BS, no masses, hernias, or organomegaly Psych: A&Ox3 with an appropriate affect.   Labs:  Results for orders placed or performed during the hospital encounter of 05/01/17 (from the past 48 hour(s))  CBC upon arrival     Status: Abnormal   Collection Time: 05/01/17  6:10 AM  Result Value Ref Range   WBC 9.8 4.0 - 10.5 K/uL   RBC 4.41 3.87 - 5.11 MIL/uL   Hemoglobin 13.3 12.0 - 15.0 g/dL   HCT 39.2 36.0 - 46.0 %   MCV 88.9 78.0 - 100.0 fL   MCH 30.2 26.0 - 34.0 pg   MCHC 33.9 30.0 - 36.0 g/dL   RDW 12.3 11.5 - 15.5 %   Platelets 545 (H) 150 - 400 K/uL  Protime-INR upon arrival     Status: None   Collection Time: 05/01/17  6:10 AM  Result Value Ref Range   Prothrombin Time 13.6 11.4 - 15.2 seconds   INR 1.05     Imaging: No  results found.  Assessment/Plan 1. Omental nodule  We will proceed with a omental mass biopsy today.  Her labs and vitals have been reviewed.  Risks and benefits discussed with the patient including, but not limited to bleeding, infection, damage to adjacent structures or low yield requiring additional tests. All of the patient's questions were answered, patient is agreeable to proceed. Consent signed and in chart.   Thank you for this interesting consult.  I greatly enjoyed meeting Colleen Mckinney and look forward to participating in their care.  A copy of this report was sent to the requesting provider on this date.  Electronically Signed: Henreitta Cea 05/01/2017, 7:54 AM   I spent a total of  30 Minutes   in face to face in clinical consultation, greater than 50% of which was counseling/coordinating care for omental nodule

## 2017-05-03 ENCOUNTER — Encounter: Payer: Self-pay | Admitting: Hematology and Oncology

## 2017-05-03 DIAGNOSIS — C562 Malignant neoplasm of left ovary: Secondary | ICD-10-CM | POA: Insufficient documentation

## 2017-05-04 ENCOUNTER — Ambulatory Visit (HOSPITAL_BASED_OUTPATIENT_CLINIC_OR_DEPARTMENT_OTHER): Payer: Medicare Other | Admitting: Hematology and Oncology

## 2017-05-04 ENCOUNTER — Encounter: Payer: Self-pay | Admitting: Hematology and Oncology

## 2017-05-04 ENCOUNTER — Telehealth: Payer: Self-pay | Admitting: Hematology and Oncology

## 2017-05-04 VITALS — BP 114/69 | HR 94 | Temp 98.1°F | Resp 18 | Ht 64.0 in | Wt 104.8 lb

## 2017-05-04 DIAGNOSIS — C786 Secondary malignant neoplasm of retroperitoneum and peritoneum: Secondary | ICD-10-CM

## 2017-05-04 DIAGNOSIS — R630 Anorexia: Secondary | ICD-10-CM

## 2017-05-04 DIAGNOSIS — G893 Neoplasm related pain (acute) (chronic): Secondary | ICD-10-CM

## 2017-05-04 DIAGNOSIS — C801 Malignant (primary) neoplasm, unspecified: Secondary | ICD-10-CM

## 2017-05-04 DIAGNOSIS — C561 Malignant neoplasm of right ovary: Secondary | ICD-10-CM | POA: Diagnosis not present

## 2017-05-04 MED ORDER — LIDOCAINE-PRILOCAINE 2.5-2.5 % EX CREA: TOPICAL_CREAM | CUTANEOUS | 3 refills | Status: DC

## 2017-05-04 MED ORDER — OXYCODONE HCL 5 MG PO TABS: 5.0000 mg | ORAL_TABLET | ORAL | 0 refills | Status: DC | PRN

## 2017-05-04 MED ORDER — PROCHLORPERAZINE MALEATE 10 MG PO TABS: 10.0000 mg | ORAL_TABLET | Freq: Four times a day (QID) | ORAL | 1 refills | Status: DC | PRN

## 2017-05-04 MED ORDER — ONDANSETRON HCL 8 MG PO TABS: 8.0000 mg | ORAL_TABLET | Freq: Three times a day (TID) | ORAL | 1 refills | Status: DC | PRN

## 2017-05-04 MED ORDER — DEXAMETHASONE 4 MG PO TABS: ORAL_TABLET | ORAL | 1 refills | Status: DC

## 2017-05-04 NOTE — Telephone Encounter (Signed)
Gave avs and calendar for November and December  °

## 2017-05-04 NOTE — Assessment & Plan Note (Signed)
The patient has malignant cachexia For now, I recommend symptom management for pain and will continue to reassess If her appetite continues to decline, I will start her on appetite stimulant

## 2017-05-04 NOTE — Progress Notes (Signed)
[place] CONSULT NOTE  Patient Care Team: Tisovec, Fransico Him, MD as PCP - General (Internal Medicine)  CHIEF COMPLAINTS/PURPOSE OF CONSULTATION:  Right ovarian cancer with peritoneal carcinomatosis, for neoadjuvant chemotherapy  HISTORY OF PRESENTING ILLNESS:  Colleen Mckinney 71 y.o. female is here because of recent diagnosis of ovarian cancer with carcinomatosis. A family friend and her daughter, Caryl Pina are present. 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  She presented for evaluation of an ultrasound was performed which demonstrated a uterus measuring 6 x 5 cm with a 1.3 mm endometrial stripe.  Multiple heterogeneous pelvic masses were appreciated the right measured 8 x 5 cm and another measured 10 cm just posterior to the uterus CA-125 was obtained and returned a value of 404.8. She also reports early satiety for approximately 2 months and a 10 pound weight loss over the last year.  She also reports mild abdominal bloating and sensation of pelvic heaviness      04/25/2017 Imaging    Ct abdomen and pelvis: 1. Appearance compatible with ovarian malignancy with widespread peritoneal, mesenteric, and omental metastatic disease. Specifically, a 12.7 cm primarily solid and partially cystic mass above the uterus probably arises from the right ovary and is associated with omental caking, complex ascites with peritoneal metastatic deposits, and mesenteric deposits of tumor. 2. Mild intrahepatic biliary dilatation. The extrahepatic biliary tree is within normal limits. 3.  Aortic Atherosclerosis (ICD10-I70.0).      05/01/2017 Procedure    Successful CT-guided left abdominal omental mass 18 gauge core biopsies      05/01/2017 Pathology Results    Soft Tissue Needle Core  Biopsy, Left omental - METASTATIC CARCINOMA, SEE COMMENT. 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 Anxiety    claustrophobia, fear of heights  . Complication of anesthesia    remembers severe sore throat after kidney surgery and feeling dizzy  . 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 Left shoulder pain   . Skin lesion    "pre-cancer" -past hx.    SURGICAL HISTORY: Past Surgical History:  Procedure Laterality Date  . BREAST SURGERY Right    lumpectomy-benign  . CERVIX SURGERY     "freezing" dysplasia  . COLONOSCOPY    . HYSTEROSCOPY    . NEPHRECTOMY  01/1998   right--donation  . SHOULDER ARTHROSCOPY Left    "impingement" "frozen shoulder"  . SHOULDER ARTHROSCOPY WITH SUBACROMIAL DECOMPRESSION Right 10/23/2013   Procedure: RIGHT SHOULDER ARTHROSCOPY WITH SUBACROMIAL DECOMPRESSION, EVULATION UNDER ANESTHESIA  AND MANIPULATION UNDER ANESTHESIA;  Surgeon: Johnn Hai, MD;  Location: [facility];  Service: Orthopedics;  Laterality: Right;    SOCIAL HISTORY: Social History   Social History  . Marital status: Divorced    Spouse name: N/A  . Number of children: N/A  . Years of  education: N/A   Occupational History  . retired-- Manufacturing engineer    Social History Main Topics  . Smoking status: Never Smoker  . Smokeless tobacco: Never Used  . Alcohol use 0.6 oz/week    1 Cans of beer per week  . Drug use: No  . Sexual activity: Not Currently    Partners: Male   Other Topics Concern  . Not on file    Social History Narrative  . No narrative on file    FAMILY HISTORY: Family History  Problem Relation Age of Onset  . Heart disease Mother        mitral valve prolapse  . Hypertension Mother   . Hyperlipidemia Mother   . Cancer Mother 56       breast  . Osteopenia Mother   . Heart disease Father        bypass  . Hypertension Father   . Hyperlipidemia Father   . Cancer Father 25       bladder,  prostate  . Hyperlipidemia Sister   . Colon cancer Maternal Grandfather     ALLERGIES:  has No Known Allergies.  MEDICATIONS:  Current Outpatient Prescriptions  Medication Sig Dispense Refill  . acetaminophen (TYLENOL) 500 MG tablet Take 500 mg by mouth every 6 (six) hours as needed for mild pain or moderate pain.     . Calcium Carbonate-Vitamin D (CALCIUM + D PO) Take 1 tablet by mouth daily.     Marland Kitchen dexamethasone (DECADRON) 4 MG tablet Take 5 tabs the evening before chemo and 5 tabs the morning of chemo, every 21 days by mouth with pills 30 tablet 1  . ibuprofen (ADVIL,MOTRIN) 200 MG tablet Take 200 mg by mouth every 6 (six) hours as needed for mild pain or moderate pain. Pain     . lidocaine-prilocaine (EMLA) cream Apply to affected area once 30 g 3  . ondansetron (ZOFRAN) 8 MG tablet Take 1 tablet (8 mg total) by mouth every 8 (eight) hours as needed for refractory nausea / vomiting. Start on day 3 after chemo. 30 tablet 1  . oxyCODONE (OXY IR/ROXICODONE) 5 MG immediate release tablet Take 1 tablet (5 mg total) by mouth every 4 (four) hours as needed for severe pain. 60 tablet 0  . prochlorperazine (COMPAZINE) 10 MG tablet Take 1 tablet (10 mg total) by mouth every 6 (six) hours as needed (Nausea or vomiting). 30 tablet 1   No current facility-administered medications for this visit.     REVIEW OF SYSTEMS:   Constitutional: Denies fevers, chills or abnormal night sweats Eyes: Denies blurriness of vision, double vision or watery eyes Ears, nose, mouth, throat, and face: Denies  mucositis or sore throat Respiratory: Denies cough, dyspnea or wheezes Cardiovascular: Denies palpitation, chest discomfort or lower extremity swelling Skin: Denies abnormal skin rashes Lymphatics: Denies new lymphadenopathy or easy bruising Neurological:Denies numbness, tingling or new weaknesses Behavioral/Psych: Mood is stable, no new changes  All other systems were reviewed with the patient and are negative.  PHYSICAL EXAMINATION: ECOG PERFORMANCE STATUS: 1 - Symptomatic but completely ambulatory  Vitals:   05/04/17 1235  BP: 114/69  Pulse: 94  Resp: 18  Temp: 98.1 F (36.7 C)  SpO2: 100%   Filed Weights   05/04/17 1235  Weight: 104 lb 12.8 oz (47.5 kg)    GENERAL:alert, no distress and comfortable.  She looks thin and mildly cachectic SKIN: skin color, texture, turgor are normal, no rashes or significant lesions EYES: normal, conjunctiva are pink  and non-injected, sclera clear OROPHARYNX:no exudate, no erythema and lips, buccal mucosa, and tongue normal  NECK: supple, thyroid normal size, non-tender, without nodularity LYMPH:  no palpable lymphadenopathy in the cervical, axillary or inguinal LUNGS: clear to auscultation and percussion with normal breathing effort HEART: regular rate & rhythm and no murmurs and no lower extremity edema ABDOMEN:abdomen soft, distended with minimum ascites, hypoactive bowel sounds with mild tenderness on palpation throughout Musculoskeletal:no cyanosis of digits and no clubbing  PSYCH: alert & oriented x 3 with fluent speech NEURO: no focal motor/sensory deficits  LABORATORY DATA:  I have reviewed the data as listed Lab Results  Component Value Date   WBC 9.8 05/01/2017   HGB 13.3 05/01/2017   HCT 39.2 05/01/2017   MCV 88.9 05/01/2017   PLT 545 (H) 05/01/2017   No results for input(s): NA, K, CL, CO2, GLUCOSE, BUN, CREATININE, CALCIUM, GFRNONAA, GFRAA, PROT, ALBUMIN, AST, ALT, ALKPHOS, BILITOT, BILIDIR, IBILI in the last 8760  hours.  RADIOGRAPHIC STUDIES: I have personally reviewed the radiological images as listed and agreed with the findings in the report. Ct Abdomen Pelvis W Contrast  Result Date: 04/25/2017 CLINICAL DATA:  Pelvic pain and pressure. Swelling and bloating. History of prior donation of the right kidney. EXAM: CT ABDOMEN AND PELVIS WITH CONTRAST TECHNIQUE: Multidetector CT imaging of the abdomen and pelvis was performed using the standard protocol following bolus administration of intravenous contrast. CONTRAST:  18mL ISOVUE-300 IOPAMIDOL (ISOVUE-300) INJECTION 61% Creatinine was obtained on site at [facility] at [address] Results: Creatinine 0.9 mg/dL. COMPARISON:  Hepatobiliary ultrasound dated 09/12/2013 FINDINGS: Lower chest: Unremarkable Hepatobiliary: A new capsular mass along the dome of the right hepatic lobe slightly indents the parenchyma on image 32/3, and measures 2.5 by 2.3 by 1.4 cm. Several tiny hypodense lesions in the lateral segment left hepatic lobe in the to 3 mm range are shown on images 11-12 of series 2 and are probably small benign cysts or similar lesions. Mild intrahepatic biliary dilatation. The common hepatic duct measures proximally 7 mm in diameter in the common bile duct also about 7 mm. Slight truncation of the distal CBD in the vicinity of the ampulla, significance uncertain. Gallbladder mildly contracted but otherwise unremarkable. Pancreas: Unremarkable Spleen: Unremarkable Adrenals/Urinary Tract: Adrenal glands normal. Several miniscule hypodense lesions of the left kidney are likely tiny cysts and there are also small parapelvic cysts of the left kidney. Right kidney surgically absent. No hydronephrosis. Much of the left ureter is indistinct. Urinary bladder is empty. Stomach/Bowel: Mild prominence of stool in the proximal colon extending through to the level of the transverse colon, the distal colon is nondistended. No dilated bowel. Vascular/Lymphatic:  Aortoiliac atherosclerotic vascular disease. No pathologic retroperitoneal adenopathy. Reproductive: Dorsal to the uterus there is a 12.7 by 7.1 by 7.3 cm primarily solid mass with cystic elements, some eccentricity to the right side, probably a right ovarian malignancy. Admittedly this extends over into the left adnexa and a left-sided origin is not totally excluded. The mass is intimately associated with the dorsal uterus and also abuts the rectosigmoid. Bowel invasion not excluded. Other: There is extensive omental caking of tumor, most notable in the left abdomen about at the level of the iliac crest or local omental caking is up to 2.9 cm in thickness on image 42/2. Scattered ascites noted with some enhancement along the margin of the ascites compatible with peritoneal spread of malignancy. Scattered tumor in the mesentery, for example in the right colon mesentery where  a 2.9 by 2.2 cm ill-defined deposit of tumor is shown on image 40/2. The appearance is compatible with widespread peritoneal and omental metastatic disease. New scattered additional deposits are identified. Musculoskeletal: Unremarkable IMPRESSION: 1. Appearance compatible with ovarian malignancy with widespread peritoneal, mesenteric, and omental metastatic disease. Specifically, a 12.7 cm primarily solid and partially cystic mass above the uterus probably arises from the right ovary and is associated with omental caking, complex ascites with peritoneal metastatic deposits, and mesenteric deposits of tumor. 2. Mild intrahepatic biliary dilatation. The extrahepatic biliary tree is within normal limits. 3.  Aortic Atherosclerosis (ICD10-I70.0). Electronically Signed   By: Van Clines M.D.   On: 04/25/2017 14:09   Ct Biopsy  Result Date: 05/01/2017 INDICATION: Omental mass, ovarian mass with ascites. Imaging findings concerning for metastatic ovarian cancer. EXAM: CT GUIDED CORE BIOPSY OF THE LEFT OMENTAL MASS MEDICATIONS: 1% lidocaine  local ANESTHESIA/SEDATION: Moderate (conscious) sedation was employed during this procedure. A total of Versed 1.5 mg and Fentanyl 75 mcg was administered intravenously. Moderate Sedation Time: 13 minutes. The patient's level of consciousness and vital signs were monitored continuously by radiology nursing throughout the procedure under my direct supervision. FLUOROSCOPY TIME:  Fluoroscopy Time: None. COMPLICATIONS: None immediate. PROCEDURE: Informed written consent was obtained from the patient after a thorough discussion of the procedural risks, benefits and alternatives. All questions were addressed. Maximal Sterile Barrier Technique was utilized including caps, mask, sterile gowns, sterile gloves, sterile drape, hand hygiene and skin antiseptic. A timeout was performed prior to the initiation of the procedure. Previous imaging reviewed. Patient positioned supine. Noncontrast localization CT performed. The left abdominal omental mass was localized. Overlying skin marked. Under sterile conditions and local anesthesia, a 17 gauge 6.8 cm access needle was advanced into the left omental mass with CT guidance. Needle position confirmed with CT. Several 18 gauge core biopsies obtained. Samples placed in formalin. Needle removed. Patient tolerated the biopsy well. No immediate complication. IMPRESSION: Successful CT-guided left abdominal omental mass 18 gauge core biopsies Electronically Signed   By: Jerilynn Mages.  Shick M.D.   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 The intent is of curative intent. We discussed neoadjuvant chemotherapy approach and the rationale behind this The patient may benefit from genetic testing in the near future We also discussed the risk and benefits of referral to tertiary center for second opinion  We discussed some of the risks, benefits, side-effects of carboplatin & Taxol.  Treatment is intravenous, every 3 weeks x 6 cycles  Some of the short term side-effects included, though not limited to, including weight loss, life threatening infections, risk of allergic reactions, need for transfusions of blood products, nausea, vomiting, change in bowel habits, loss of hair, admission to hospital for various reasons, and risks of death.   Long term side-effects are also discussed including risks of infertility, permanent damage to nerve function, hearing loss, chronic fatigue, kidney damage with possibility needing hemodialysis, and rare secondary malignancy including bone marrow disorders.  The patient is aware that the response rates discussed earlier is not guaranteed.  After a long discussion, patient made an informed decision to proceed with the prescribed plan of care.   Patient education material was dispensed. We discussed premedication with dexamethasone before chemotherapy. 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 IR FLUORO GUIDE PORT INSERTION RIGHT    Standing Status:   Future    Standing Expiration Date:   07/04/2018    Order Specific Question:   Reason for Exam (SYMPTOM  OR DIAGNOSIS REQUIRED)    Answer:   need port for chemo    Order Specific Question:   Preferred Imaging Location?    Answer:   [facility]  . CBC with Differential    Standing Status:   Standing     Number of Occurrences:   20    Standing Expiration Date:   05/05/2018  . Comprehensive metabolic panel    Standing Status:   Standing    Number of Occurrences:   20    Standing Expiration Date:   05/05/2018     All questions were answered. The patient knows to call the clinic with any problems, questions or concerns. I spent 60 minutes counseling the patient face to face. The total time spent in the appointment was 80 minutes and more than 50% was on counseling.     Heath Lark, MD 05/04/2017 2:40 PM

## 2017-05-04 NOTE — Assessment & Plan Note (Signed)
We reviewed the NCCN guidelines We discussed the role of chemotherapy. The intent is of curative intent. We discussed neoadjuvant chemotherapy approach and the rationale behind this The patient may benefit from genetic testing in the near future We also discussed the risk and benefits of referral to tertiary center for second opinion  We discussed some of the risks, benefits, side-effects of carboplatin & Taxol. Treatment is intravenous, every 3 weeks x 6 cycles  Some of the short term side-effects included, though not limited to, including weight loss, life threatening infections, risk of allergic reactions, need for transfusions of blood products, nausea, vomiting, change in bowel habits, loss of hair, admission to hospital for various reasons, and risks of death.   Long term side-effects are also discussed including risks of infertility, permanent damage to nerve function, hearing loss, chronic fatigue, kidney damage with possibility needing hemodialysis, and rare secondary malignancy including bone marrow disorders.  The patient is aware that the response rates discussed earlier is not guaranteed.  After a long discussion, patient made an informed decision to proceed with the prescribed plan of care.   Patient education material was dispensed. We discussed premedication with dexamethasone before chemotherapy. I will schedule port placement I will schedule chemo education class I plan to see her back before for cycle of treatment for symptom management

## 2017-05-04 NOTE — Progress Notes (Signed)
START ON PATHWAY REGIMEN - Ovarian     A cycle is every 21 days:     Paclitaxel      Carboplatin   **Always confirm dose/schedule in your pharmacy ordering system**    Patient Characteristics: Newly Diagnosed, Neoadjuvant Therapy AJCC T Category: T3 AJCC N Category: N1 AJCC M Category: M0 Therapeutic Status: Newly Diagnosed, Neoadjuvant Therapy AJCC 8 Stage Grouping: Unknown Intent of Therapy: Curative Intent, Discussed with Patient

## 2017-05-04 NOTE — Assessment & Plan Note (Signed)
We discussed pain management I recommend oxycodone as needed I recommend avoiding NSAID or Tylenol during treatment due to fever suppressive effects I warned about risk of sedation, nausea and constipation

## 2017-05-07 ENCOUNTER — Telehealth: Payer: Self-pay | Admitting: *Deleted

## 2017-05-07 NOTE — Telephone Encounter (Signed)
Tiffany in IR scheduled port placement and will draw labs

## 2017-05-10 ENCOUNTER — Other Ambulatory Visit: Payer: Medicare Other

## 2017-05-10 ENCOUNTER — Encounter: Payer: Self-pay | Admitting: *Deleted

## 2017-05-11 ENCOUNTER — Other Ambulatory Visit: Payer: Self-pay | Admitting: Radiology

## 2017-05-14 ENCOUNTER — Other Ambulatory Visit: Payer: Self-pay | Admitting: Radiology

## 2017-05-14 ENCOUNTER — Telehealth: Payer: Self-pay | Admitting: *Deleted

## 2017-05-14 NOTE — Telephone Encounter (Signed)
Patient called and left a message that "I'm having a port placed tomorrow morning and need to be NPO after midnight. Can I still take my pain med in the morning." Called the patient back and advised her "you can take the pain med with a few slips of water. Also gave her the number to radiology to verify."

## 2017-05-15 ENCOUNTER — Encounter (HOSPITAL_COMMUNITY): Payer: Self-pay

## 2017-05-15 ENCOUNTER — Ambulatory Visit (HOSPITAL_COMMUNITY)
Admission: RE | Admit: 2017-05-15 | Discharge: 2017-05-15 | Disposition: A | Discharge status: Home / Self Care | Payer: Medicare Other | Source: Ambulatory Visit | Attending: Hematology and Oncology | Admitting: Hematology and Oncology

## 2017-05-15 ENCOUNTER — Other Ambulatory Visit: Payer: Self-pay | Admitting: Hematology and Oncology

## 2017-05-15 DIAGNOSIS — Z905 Acquired absence of kidney: Secondary | ICD-10-CM | POA: Insufficient documentation

## 2017-05-15 DIAGNOSIS — F419 Anxiety disorder, unspecified: Secondary | ICD-10-CM | POA: Diagnosis not present

## 2017-05-15 DIAGNOSIS — C569 Malignant neoplasm of unspecified ovary: Secondary | ICD-10-CM | POA: Insufficient documentation

## 2017-05-15 DIAGNOSIS — C786 Secondary malignant neoplasm of retroperitoneum and peritoneum: Secondary | ICD-10-CM

## 2017-05-15 DIAGNOSIS — C801 Malignant (primary) neoplasm, unspecified: Secondary | ICD-10-CM

## 2017-05-15 DIAGNOSIS — C561 Malignant neoplasm of right ovary: Secondary | ICD-10-CM

## 2017-05-15 HISTORY — PX: IR FLUORO GUIDE PORT INSERTION RIGHT: IMG5741

## 2017-05-15 HISTORY — PX: IR US GUIDE VASC ACCESS RIGHT: IMG2390

## 2017-05-15 LAB — CBC WITH DIFFERENTIAL/PLATELET
Basophils Absolute: 0 10*3/uL (ref 0.0–0.1)
Basophils Relative: 0 %
EOS ABS: 0.1 10*3/uL (ref 0.0–0.7)
Eosinophils Relative: 1 %
HCT: 31 % — ABNORMAL LOW (ref 36.0–46.0)
HEMOGLOBIN: 10.6 g/dL — AB (ref 12.0–15.0)
LYMPHS ABS: 1.1 10*3/uL (ref 0.7–4.0)
LYMPHS PCT: 10 %
MCH: 29.8 pg (ref 26.0–34.0)
MCHC: 34.2 g/dL (ref 30.0–36.0)
MCV: 87.1 fL (ref 78.0–100.0)
MONOS PCT: 8 %
Monocytes Absolute: 0.9 10*3/uL (ref 0.1–1.0)
NEUTROS PCT: 81 %
Neutro Abs: 8.7 10*3/uL — ABNORMAL HIGH (ref 1.7–7.7)
Platelets: 520 10*3/uL — ABNORMAL HIGH (ref 150–400)
RBC: 3.56 MIL/uL — ABNORMAL LOW (ref 3.87–5.11)
RDW: 12.3 % (ref 11.5–15.5)
WBC: 10.8 10*3/uL — ABNORMAL HIGH (ref 4.0–10.5)

## 2017-05-15 LAB — PROTIME-INR
INR: 1.04
PROTHROMBIN TIME: 13.5 s (ref 11.4–15.2)

## 2017-05-15 LAB — COMPREHENSIVE METABOLIC PANEL
ALK PHOS: 50 U/L (ref 38–126)
ALT: 26 U/L (ref 14–54)
ANION GAP: 10 (ref 5–15)
AST: 25 U/L (ref 15–41)
Albumin: 3.2 g/dL — ABNORMAL LOW (ref 3.5–5.0)
BUN: 16 mg/dL (ref 6–20)
CALCIUM: 8.5 mg/dL — AB (ref 8.9–10.3)
CHLORIDE: 104 mmol/L (ref 101–111)
CO2: 26 mmol/L (ref 22–32)
Creatinine, Ser: 0.97 mg/dL (ref 0.44–1.00)
GFR calc non Af Amer: 57 mL/min — ABNORMAL LOW (ref >60)
Glucose, Bld: 69 mg/dL (ref 65–99)
Potassium: 4.3 mmol/L (ref 3.5–5.1)
SODIUM: 140 mmol/L (ref 135–145)
Total Bilirubin: 0.8 mg/dL (ref 0.3–1.2)
Total Protein: 6.9 g/dL (ref 6.5–8.1)

## 2017-05-15 MED ORDER — HEPARIN SOD (PORK) LOCK FLUSH 100 UNIT/ML IV SOLN
INTRAVENOUS | Status: AC
  Filled 2017-05-15: qty 5

## 2017-05-15 MED ORDER — CEFAZOLIN SODIUM-DEXTROSE 2-4 GM/100ML-% IV SOLN
2.0000 g | INTRAVENOUS | Status: AC
  Administered 2017-05-15: 2 g via INTRAVENOUS

## 2017-05-15 MED ORDER — FENTANYL CITRATE (PF) 100 MCG/2ML IJ SOLN
INTRAMUSCULAR | Status: AC
  Filled 2017-05-15: qty 2

## 2017-05-15 MED ORDER — MIDAZOLAM HCL 2 MG/2ML IJ SOLN
INTRAMUSCULAR | Status: AC
  Filled 2017-05-15: qty 4

## 2017-05-15 MED ORDER — MIDAZOLAM HCL 2 MG/2ML IJ SOLN
INTRAMUSCULAR | Status: AC | PRN
  Administered 2017-05-15 (×4): 1 mg via INTRAVENOUS

## 2017-05-15 MED ORDER — FENTANYL CITRATE (PF) 100 MCG/2ML IJ SOLN
INTRAMUSCULAR | Status: AC | PRN
  Administered 2017-05-15 (×2): 50 ug via INTRAVENOUS

## 2017-05-15 MED ORDER — CEFAZOLIN SODIUM-DEXTROSE 2-4 GM/100ML-% IV SOLN
INTRAVENOUS | Status: AC
  Filled 2017-05-15: qty 100

## 2017-05-15 MED ORDER — SODIUM CHLORIDE 0.9 % IV SOLN
INTRAVENOUS | Status: DC
  Administered 2017-05-15: 11:00:00 via INTRAVENOUS

## 2017-05-15 MED ORDER — LIDOCAINE-EPINEPHRINE (PF) 2 %-1:200000 IJ SOLN
INTRAMUSCULAR | Status: AC
  Filled 2017-05-15: qty 20

## 2017-05-15 MED ORDER — LIDOCAINE-EPINEPHRINE (PF) 2 %-1:200000 IJ SOLN
INTRAMUSCULAR | Status: AC | PRN
  Administered 2017-05-15: 20 mL

## 2017-05-15 NOTE — Discharge Instructions (Signed)
Moderate Conscious Sedation, Adult, Care After °These instructions provide you with information about caring for yourself after your procedure. Your health care provider may also give you more specific instructions. Your treatment has been planned according to current medical practices, but problems sometimes occur. Call your health care provider if you have any problems or questions after your procedure. °What can I expect after the procedure? °After your procedure, it is common: °· To feel sleepy for several hours. °· To feel clumsy and have poor balance for several hours. °· To have poor judgment for several hours. °· To vomit if you eat too soon. ° °Follow these instructions at home: °For at least 24 hours after the procedure: ° °· Do not: °? Participate in activities where you could fall or become injured. °? Drive. °? Use heavy machinery. °? Drink alcohol. °? Take sleeping pills or medicines that cause drowsiness. °? Make important decisions or sign legal documents. °? Take care of children on your own. °· Rest. °Eating and drinking °· Follow the diet recommended by your health care provider. °· If you vomit: °? Drink water, juice, or soup when you can drink without vomiting. °? Make sure you have little or no nausea before eating solid foods. °General instructions °· Have a responsible adult stay with you until you are awake and alert. °· Take over-the-counter and prescription medicines only as told by your health care provider. °· If you smoke, do not smoke without supervision. °· Keep all follow-up visits as told by your health care provider. This is important. °Contact a health care provider if: °· You keep feeling nauseous or you keep vomiting. °· You feel light-headed. °· You develop a rash. °· You have a fever. °Get help right away if: °· You have trouble breathing. °This information is not intended to replace advice given to you by your health care provider. Make sure you discuss any questions you have  with your health care provider. °Document Released: 04/09/2013 Document Revised: 11/22/2015 Document Reviewed: 10/09/2015 °Elsevier Interactive Patient Education © 2018 [employer] °Implanted Port Insertion °Implanted port insertion is a procedure to put in a port and catheter. The port is a device with an injectable disk that can be accessed by your health care provider. The port is connected to a vein in the chest or neck by a small flexible tube (catheter). There are different types of ports. The implanted port may be used as a long-term IV access for: °· Medicines, such as chemotherapy. °· Fluids. °· Liquid nutrition, such as total parenteral nutrition (TPN). °· Blood samples. ° °Having a port means that your health care provider will not need to use the veins in your arms for these procedures. °Tell a health care provider about: °· Any allergies you have. °· All medicines you are taking, especially blood thinners, as well as any vitamins, herbs, eye drops, creams, over-the-counter medicines, and steroids. °· Any problems you or family members have had with anesthetic medicines. °· Any blood disorders you have. °· Any surgeries you have had. °· Any medical conditions you have, including diabetes or kidney problems. °· Whether you are pregnant or may be pregnant. °What are the risks? °Generally, this is a safe procedure. However, problems may occur, including: °· Allergic reactions to medicines or dyes. °· Damage to other structures or organs. °· Infection. °· Damage to the blood vessel, bruising, or bleeding at the puncture site. °· Blood clot. °· Breakdown of the skin over the port. °· A collection   of air in the chest that can cause one of the lungs to collapse (pneumothorax). This is rare. ° °What happens before the procedure? °Staying hydrated °Follow instructions from your health care provider about hydration, which may include: °· Up to 2 hours before the procedure - you may continue to drink clear  liquids, such as water, clear fruit juice, black coffee, and plain tea. ° °Eating and drinking restrictions °· Follow instructions from your health care provider about eating and drinking, which may include: °? 8 hours before the procedure - stop eating heavy meals or foods such as meat, fried foods, or fatty foods. °? 6 hours before the procedure - stop eating light meals or foods, such as toast or cereal. °? 6 hours before the procedure - stop drinking milk or drinks that contain milk. °? 2 hours before the procedure - stop drinking clear liquids. °Medicines °· Ask your health care provider about: °? Changing or stopping your regular medicines. This is especially important if you are taking diabetes medicines or blood thinners. °? Taking medicines such as aspirin and ibuprofen. These medicines can thin your blood. Do not take these medicines before your procedure if your health care provider instructs you not to. °· You may be given antibiotic medicine to help prevent infection. °General instructions °· Plan to have someone take you home from the hospital or clinic. °· If you will be going home right after the procedure, plan to have someone with you for 24 hours. °· You may have blood tests. °· You may be asked to shower with a germ-killing soap. °What happens during the procedure? °· To lower your risk of infection: °? Your health care team will wash or sanitize their hands. °? Your skin will be washed with soap. °? Hair may be removed from the surgical area. °· An IV tube will be inserted into one of your veins. °· You will be given one or more of the following: °? A medicine to help you relax (sedative). °? A medicine to numb the area (local anesthetic). °· Two small cuts (incisions) will be made to insert the port. °? One incision will be made in your neck to get access to the vein where the catheter will lie. °? The other incision will be made in the upper chest. This is where the port will lie. °· The  procedure may be done using continuous X-ray (fluoroscopy) or other imaging tools for guidance. °· The port and catheter will be placed. There may be a small, raised area where the port is. °· The port will be flushed with a salt solution (saline), and blood will be drawn to make sure that it is working correctly. °· The incisions will be closed. °· Bandages (dressings) may be placed over the incisions. °The procedure may vary among health care providers and hospitals. °What happens after the procedure? °· Your blood pressure, heart rate, breathing rate, and blood oxygen level will be monitored until the medicines you were given have worn off. °· Do not drive for 24 hours if you were given a sedative. °· You will be given a manufacturer's information card for the type of port that you have. Keep this with you. °· Your port will need to be flushed and checked as told by your health care provider, usually every few weeks. °· A chest X-ray will be done to: °? Check the placement of the port. °? Make sure there is no injury to your lung. °Summary °· Implanted   port insertion is a procedure to put in a port and catheter. °· The implanted port is used as a long-term IV access. °· The port will need to be flushed and checked as told by your health care provider, usually every few weeks. °· Keep your manufacturer's information card with you at all times. °This information is not intended to replace advice given to you by your health care provider. Make sure you discuss any questions you have with your health care provider. °Document Released: 04/09/2013 Document Revised: 05/10/2016 Document Reviewed: 05/10/2016 °Elsevier Interactive Patient Education © 2017 [employer]. ° °

## 2017-05-15 NOTE — Sedation Documentation (Signed)
Patient is resting comfortably. 

## 2017-05-15 NOTE — Sedation Documentation (Signed)
Patient denies pain and is resting comfortably.  Pt speaking in clear sentences. NAD

## 2017-05-15 NOTE — Procedures (Signed)
Pre Procedure Dx: Ovarian Cancer Post Procedural Dx: Same  Successful placement of right IJ approach port-a-cath with tip at the superior caval atrial junction. The catheter is ready for immediate use.  Estimated Blood Loss: Minimal  Complications: None immediate.  Jay Leniya Breit, MD Pager #: [phone]   

## 2017-05-15 NOTE — Sedation Documentation (Signed)
Vital signs stable. 

## 2017-05-15 NOTE — H&P (Signed)
Chief Complaint: Patient was seen in consultation today for port placement at the request of [place]  Referring Physician(s): [place]  Supervising Physician: Sandi Mariscal  Patient Status: [facility] - Out-pt  History of Present Illness: Colleen Mckinney is a 71 y.o. female recently diagnosed with metastatic ovarian cancer. She is now here for port placement as she will begin treatment tomorrow. PMHx, meds, labs, imaging, allergies reviewed. Has been NPO this am. Husband at bedside.  Past Medical History:  Diagnosis Date  . Anxiety    claustrophobia, fear of heights  . Complication of anesthesia    remembers severe sore throat after kidney surgery and feeling dizzy  . 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 Left shoulder pain   . Skin lesion    "pre-cancer" -past hx.    Past Surgical History:  Procedure Laterality Date  . BREAST SURGERY Right    lumpectomy-benign  . CERVIX SURGERY     "freezing" dysplasia  . COLONOSCOPY    . HYSTEROSCOPY    . NEPHRECTOMY  01/1998   right--donation  . SHOULDER ARTHROSCOPY Left    "impingement" "frozen shoulder"    Allergies: Patient has no known allergies.  Medications: Prior to Admission medications   Medication Sig Start Date End Date Taking? Authorizing Provider  acetaminophen (TYLENOL) 500 MG tablet Take 500 mg by mouth every 6 (six) hours as needed for mild pain or moderate pain.    Yes [provider]  Calcium Carbonate-Vitamin D (CALCIUM + D PO) Take 1 tablet by mouth daily.    Yes [provider]  ibuprofen (ADVIL,MOTRIN) 200 MG tablet Take 200 mg by mouth every 6 (six) hours as needed for mild pain or moderate pain. Pain    Yes [provider]  dexamethasone (DECADRON) 4 MG tablet Take 5 tabs the evening before chemo and 5 tabs the morning of chemo, every 21 days by mouth with pills 05/04/17   Heath Lark, MD  lidocaine-prilocaine (EMLA) cream Apply to affected area once 05/04/17    Heath Lark, MD  ondansetron (ZOFRAN) 8 MG tablet Take 1 tablet (8 mg total) by mouth every 8 (eight) hours as needed for refractory nausea / vomiting. Start on day 3 after chemo. 05/04/17   Heath Lark, MD  oxyCODONE (OXY IR/ROXICODONE) 5 MG immediate release tablet Take 1 tablet (5 mg total) by mouth every 4 (four) hours as needed for severe pain. 05/04/17   Heath Lark, MD  prochlorperazine (COMPAZINE) 10 MG tablet Take 1 tablet (10 mg total) by mouth every 6 (six) hours as needed (Nausea or vomiting). 05/04/17   Heath Lark, MD     Family History  Problem Relation Age of Onset  . Heart disease Mother        mitral valve prolapse  . Hypertension Mother   . Hyperlipidemia Mother   . Cancer Mother 2       breast  . Osteopenia Mother   . Heart disease Father        bypass  . Hypertension Father   . Hyperlipidemia Father   . Cancer Father 57       bladder,  prostate  . Hyperlipidemia Sister   . Colon cancer Maternal Grandfather     Social History   Socioeconomic History  . Marital status: Divorced    Spouse name: None  . Number of children: None  . Years of education: None  . Highest education level: None  Social Needs  .  Financial resource strain: None  . Food insecurity - worry: None  . Food insecurity - inability: None  . Transportation needs - medical: None  . Transportation needs - non-medical: None  Occupational History  . Occupation: retired-- Manufacturing engineer  Tobacco Use  . Smoking status: Never Smoker  . Smokeless tobacco: Never Used  Substance and Sexual Activity  . Alcohol use: Yes    Alcohol/week: 0.6 oz    Types: 1 Cans of beer per week    Comment: no alcohol comsumption in past month   . Drug use: No  . Sexual activity: Not Currently    Partners: Male  Other Topics Concern  . None  Social History Narrative  . None    Review of Systems: A 12 point ROS discussed and pertinent positives are indicated in the HPI above.  All other systems are  negative.  Review of Systems  Vital Signs: BP 113/60   Pulse 98   Temp 97.8 F (36.6 C) (Oral)   Resp 16   SpO2 100%   Physical Exam  Constitutional: She is oriented to person, place, and time. She appears well-developed and well-nourished. No distress.  HENT:  Head: Normocephalic.  Mouth/Throat: Oropharynx is clear and moist.  Neck: Normal range of motion. No JVD present. No tracheal deviation present.  Cardiovascular: Normal rate, regular rhythm and normal heart sounds.  Pulmonary/Chest: Effort normal and breath sounds normal. No respiratory distress.  Neurological: She is alert and oriented to person, place, and time.  Skin: Skin is warm and dry.  Psychiatric: She has a normal mood and affect.    Imaging: Ct Abdomen Pelvis W Contrast  Result Date: 04/25/2017 CLINICAL DATA:  Pelvic pain and pressure. Swelling and bloating. History of prior donation of the right kidney. EXAM: CT ABDOMEN AND PELVIS WITH CONTRAST TECHNIQUE: Multidetector CT imaging of the abdomen and pelvis was performed using the standard protocol following bolus administration of intravenous contrast. CONTRAST:  111mL ISOVUE-300 IOPAMIDOL (ISOVUE-300) INJECTION 61% Creatinine was obtained on site at [facility] at [address] Results: Creatinine 0.9 mg/dL. COMPARISON:  Hepatobiliary ultrasound dated 09/12/2013 FINDINGS: Lower chest: Unremarkable Hepatobiliary: A new capsular mass along the dome of the right hepatic lobe slightly indents the parenchyma on image 32/3, and measures 2.5 by 2.3 by 1.4 cm. Several tiny hypodense lesions in the lateral segment left hepatic lobe in the to 3 mm range are shown on images 11-12 of series 2 and are probably small benign cysts or similar lesions. Mild intrahepatic biliary dilatation. The common hepatic duct measures proximally 7 mm in diameter in the common bile duct also about 7 mm. Slight truncation of the distal CBD in the vicinity of the ampulla, significance  uncertain. Gallbladder mildly contracted but otherwise unremarkable. Pancreas: Unremarkable Spleen: Unremarkable Adrenals/Urinary Tract: Adrenal glands normal. Several miniscule hypodense lesions of the left kidney are likely tiny cysts and there are also small parapelvic cysts of the left kidney. Right kidney surgically absent. No hydronephrosis. Much of the left ureter is indistinct. Urinary bladder is empty. Stomach/Bowel: Mild prominence of stool in the proximal colon extending through to the level of the transverse colon, the distal colon is nondistended. No dilated bowel. Vascular/Lymphatic: Aortoiliac atherosclerotic vascular disease. No pathologic retroperitoneal adenopathy. Reproductive: Dorsal to the uterus there is a 12.7 by 7.1 by 7.3 cm primarily solid mass with cystic elements, some eccentricity to the right side, probably a right ovarian malignancy. Admittedly this extends over into the left adnexa and a left-sided  origin is not totally excluded. The mass is intimately associated with the dorsal uterus and also abuts the rectosigmoid. Bowel invasion not excluded. Other: There is extensive omental caking of tumor, most notable in the left abdomen about at the level of the iliac crest or local omental caking is up to 2.9 cm in thickness on image 42/2. Scattered ascites noted with some enhancement along the margin of the ascites compatible with peritoneal spread of malignancy. Scattered tumor in the mesentery, for example in the right colon mesentery where a 2.9 by 2.2 cm ill-defined deposit of tumor is shown on image 40/2. The appearance is compatible with widespread peritoneal and omental metastatic disease. New scattered additional deposits are identified. Musculoskeletal: Unremarkable IMPRESSION: 1. Appearance compatible with ovarian malignancy with widespread peritoneal, mesenteric, and omental metastatic disease. Specifically, a 12.7 cm primarily solid and partially cystic mass above the uterus  probably arises from the right ovary and is associated with omental caking, complex ascites with peritoneal metastatic deposits, and mesenteric deposits of tumor. 2. Mild intrahepatic biliary dilatation. The extrahepatic biliary tree is within normal limits. 3.  Aortic Atherosclerosis (ICD10-I70.0). Electronically Signed   By: Van Clines M.D.   On: 04/25/2017 14:09   Ct Biopsy  Result Date: 05/01/2017 INDICATION: Omental mass, ovarian mass with ascites. Imaging findings concerning for metastatic ovarian cancer. EXAM: CT GUIDED CORE BIOPSY OF THE LEFT OMENTAL MASS MEDICATIONS: 1% lidocaine local ANESTHESIA/SEDATION: Moderate (conscious) sedation was employed during this procedure. A total of Versed 1.5 mg and Fentanyl 75 mcg was administered intravenously. Moderate Sedation Time: 13 minutes. The patient's level of consciousness and vital signs were monitored continuously by radiology nursing throughout the procedure under my direct supervision. FLUOROSCOPY TIME:  Fluoroscopy Time: None. COMPLICATIONS: None immediate. PROCEDURE: Informed written consent was obtained from the patient after a thorough discussion of the procedural risks, benefits and alternatives. All questions were addressed. Maximal Sterile Barrier Technique was utilized including caps, mask, sterile gowns, sterile gloves, sterile drape, hand hygiene and skin antiseptic. A timeout was performed prior to the initiation of the procedure. Previous imaging reviewed. Patient positioned supine. Noncontrast localization CT performed. The left abdominal omental mass was localized. Overlying skin marked. Under sterile conditions and local anesthesia, a 17 gauge 6.8 cm access needle was advanced into the left omental mass with CT guidance. Needle position confirmed with CT. Several 18 gauge core biopsies obtained. Samples placed in formalin. Needle removed. Patient tolerated the biopsy well. No immediate complication. IMPRESSION: Successful  CT-guided left abdominal omental mass 18 gauge core biopsies Electronically Signed   By: Jerilynn Mages.  Shick M.D.   On: 05/01/2017 12:55    Labs:  CBC: Recent Labs    05/01/17 0610 05/15/17 1026  WBC 9.8 10.8*  HGB 13.3 10.6*  HCT 39.2 31.0*  PLT 545* 520*    COAGS: Recent Labs    05/01/17 0610 05/15/17 1026  INR 1.05 1.04    BMP: Recent Labs    05/15/17 1026  NA 140  K 4.3  CL 104  CO2 26  GLUCOSE 69  BUN 16  CALCIUM 8.5*  CREATININE 0.97  GFRNONAA 57*  GFRAA >60    LIVER FUNCTION TESTS: Recent Labs    05/15/17 1026  BILITOT 0.8  AST 25  ALT 26  ALKPHOS 50  PROT 6.9  ALBUMIN 3.2*    TUMOR MARKERS: No results for input(s): AFPTM, CEA, CA199, CHROMGRNA in the last 8760 hours.  Assessment and Plan: Metastatic ovarian cancer For port placement Labs ok. Slightly  elevated WBC likely secondary to loading dose of Decadron taken last pm. Risks and benefits discussed with the patient including, but not limited to bleeding, infection, pneumothorax, or fibrin sheath development and need for additional procedures. All of the patient's questions were answered, patient is agreeable to proceed. Consent signed and in chart.    Thank you for this interesting consult.  I greatly enjoyed meeting SAMAIRA HOLZWORTH and look forward to participating in their care.  A copy of this report was sent to the requesting provider on this date.  Electronically Signed: Ascencion Dike, PA-C 05/15/2017, 11:51 AM   I spent a total of 20 minutes in face to face in clinical consultation, greater than 50% of which was counseling/coordinating care for port placement

## 2017-05-15 NOTE — Sedation Documentation (Signed)
Patient denies pain and is resting comfortably.  

## 2017-05-16 ENCOUNTER — Ambulatory Visit (HOSPITAL_BASED_OUTPATIENT_CLINIC_OR_DEPARTMENT_OTHER): Payer: Medicare Other | Admitting: Hematology and Oncology

## 2017-05-16 ENCOUNTER — Other Ambulatory Visit: Payer: Self-pay | Admitting: Hematology and Oncology

## 2017-05-16 ENCOUNTER — Ambulatory Visit (HOSPITAL_BASED_OUTPATIENT_CLINIC_OR_DEPARTMENT_OTHER): Payer: Medicare Other

## 2017-05-16 ENCOUNTER — Encounter: Payer: Self-pay | Admitting: Hematology and Oncology

## 2017-05-16 VITALS — BP 110/67 | HR 59 | Temp 97.9°F | Resp 16

## 2017-05-16 DIAGNOSIS — C801 Malignant (primary) neoplasm, unspecified: Secondary | ICD-10-CM

## 2017-05-16 DIAGNOSIS — R180 Malignant ascites: Secondary | ICD-10-CM | POA: Diagnosis not present

## 2017-05-16 DIAGNOSIS — C561 Malignant neoplasm of right ovary: Secondary | ICD-10-CM

## 2017-05-16 DIAGNOSIS — Z5111 Encounter for antineoplastic chemotherapy: Secondary | ICD-10-CM

## 2017-05-16 DIAGNOSIS — C786 Secondary malignant neoplasm of retroperitoneum and peritoneum: Secondary | ICD-10-CM | POA: Diagnosis not present

## 2017-05-16 DIAGNOSIS — G893 Neoplasm related pain (acute) (chronic): Secondary | ICD-10-CM

## 2017-05-16 MED ORDER — SODIUM CHLORIDE 0.9 % IV SOLN
380.0000 mg | Freq: Once | INTRAVENOUS | Status: AC
  Administered 2017-05-16: 380 mg via INTRAVENOUS
  Filled 2017-05-16: qty 38

## 2017-05-16 MED ORDER — SODIUM CHLORIDE 0.9 % IV SOLN
Freq: Once | INTRAVENOUS | Status: AC
  Administered 2017-05-16: 12:00:00 via INTRAVENOUS

## 2017-05-16 MED ORDER — SODIUM CHLORIDE 0.9 % IV SOLN
Freq: Once | INTRAVENOUS | Status: AC
  Administered 2017-05-16: 12:00:00 via INTRAVENOUS
  Filled 2017-05-16: qty 5

## 2017-05-16 MED ORDER — PROCHLORPERAZINE MALEATE 10 MG PO TABS: 10.0000 mg | ORAL_TABLET | Freq: Four times a day (QID) | ORAL | 1 refills | Status: DC | PRN

## 2017-05-16 MED ORDER — DIPHENHYDRAMINE HCL 50 MG/ML IJ SOLN
INTRAMUSCULAR | Status: AC
  Filled 2017-05-16: qty 1

## 2017-05-16 MED ORDER — PALONOSETRON HCL INJECTION 0.25 MG/5ML
0.2500 mg | Freq: Once | INTRAVENOUS | Status: AC
  Administered 2017-05-16: 0.25 mg via INTRAVENOUS

## 2017-05-16 MED ORDER — FAMOTIDINE IN NACL 20-0.9 MG/50ML-% IV SOLN
20.0000 mg | Freq: Once | INTRAVENOUS | Status: AC
  Administered 2017-05-16: 20 mg via INTRAVENOUS

## 2017-05-16 MED ORDER — HEPARIN SOD (PORK) LOCK FLUSH 100 UNIT/ML IV SOLN
500.0000 [IU] | Freq: Once | INTRAVENOUS | Status: AC | PRN
  Administered 2017-05-16: 500 [IU]
  Filled 2017-05-16: qty 5

## 2017-05-16 MED ORDER — DIPHENHYDRAMINE HCL 50 MG/ML IJ SOLN
50.0000 mg | Freq: Once | INTRAMUSCULAR | Status: AC
  Administered 2017-05-16: 50 mg via INTRAVENOUS

## 2017-05-16 MED ORDER — PACLITAXEL CHEMO INJECTION 300 MG/50ML
175.0000 mg/m2 | Freq: Once | INTRAVENOUS | Status: AC
  Administered 2017-05-16: 258 mg via INTRAVENOUS
  Filled 2017-05-16: qty 43

## 2017-05-16 MED ORDER — FAMOTIDINE IN NACL 20-0.9 MG/50ML-% IV SOLN
INTRAVENOUS | Status: AC
  Filled 2017-05-16: qty 50

## 2017-05-16 MED ORDER — PALONOSETRON HCL INJECTION 0.25 MG/5ML
INTRAVENOUS | Status: AC
  Filled 2017-05-16: qty 5

## 2017-05-16 MED ORDER — SODIUM CHLORIDE 0.9% FLUSH
10.0000 mL | INTRAVENOUS | Status: DC | PRN
  Administered 2017-05-16: 10 mL
  Filled 2017-05-16: qty 10

## 2017-05-16 NOTE — Assessment & Plan Note (Signed)
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

## 2017-05-16 NOTE — Assessment & Plan Note (Signed)
The patient is quite symptomatic from untreated cancer. The few pounds of weight gain is likely due to ascites Clinically, I think she can wait and see how she feels after chemotherapy If her symptoms does not improve, she may need repeat paracentesis again soon. I warned her about potential risk of side effects with treatment I encouraged her to call if she does not feel well after treatment

## 2017-05-16 NOTE — Progress Notes (Signed)
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  She presented for evaluation of an ultrasound was performed which demonstrated a uterus measuring 6 x 5 cm with a 1.3 mm endometrial stripe.  Multiple heterogeneous pelvic masses were appreciated the right measured 8 x 5 cm and another measured 10 cm just posterior to the uterus CA-125 was obtained and returned a value of 404.8. She also reports early satiety for approximately 2 months and a 10 pound weight loss over the last year.  She also reports mild abdominal bloating and sensation of pelvic heaviness      04/25/2017 Imaging    Ct abdomen and pelvis: 1. Appearance compatible with ovarian malignancy with widespread peritoneal, mesenteric, and omental metastatic disease. Specifically, a 12.7 cm primarily solid and partially cystic mass above the uterus probably arises from the right ovary and is associated with omental caking, complex ascites with peritoneal metastatic deposits, and mesenteric deposits of tumor. 2. Mild intrahepatic biliary dilatation. The extrahepatic biliary tree is within normal limits. 3.  Aortic Atherosclerosis (ICD10-I70.0).      05/01/2017 Procedure    Successful CT-guided left abdominal omental mass 18 gauge core biopsies      05/01/2017 Pathology Results    Soft Tissue Needle Core Biopsy, Left omental - METASTATIC CARCINOMA, SEE COMMENT. 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 The catheter is ready for immediate use.       INTERVAL HISTORY: Please  see below for problem oriented charting. She returns to be seen prior to cycle 1 of treatment Her pain is fairly stable with current prescription oxycodone She has some constipation She had poor appetite and nausea She thought she have lost some weight but yet she has gained weight based on her weight today She felt that her abdomen is started to become progressively swollen  REVIEW OF SYSTEMS:   Constitutional: Denies fevers, chills or abnormal weight loss Eyes: Denies blurriness of vision Ears, nose, mouth, throat, and face: Denies mucositis or sore throat Respiratory: Denies cough, dyspnea or wheezes Cardiovascular: Denies palpitation, chest discomfort or lower extremity swelling Skin: Denies abnormal skin rashes Lymphatics: Denies new lymphadenopathy or easy bruising Neurological:Denies numbness, tingling or new weaknesses Behavioral/Psych: Mood is stable, no new changes  All other systems were reviewed with the patient and are negative.  I have reviewed the past medical history, past surgical history, social history and family history with the patient and they are unchanged from previous note.  ALLERGIES:  has No Known Allergies.  MEDICATIONS:  Current Outpatient Medications  Medication Sig Dispense Refill  . acetaminophen (TYLENOL) 500 MG tablet Take 500 mg by mouth every 6 (six) hours as needed for mild pain or moderate pain.     . Calcium Carbonate-Vitamin D (CALCIUM + D PO) Take 1 tablet by mouth daily.     Marland Kitchen dexamethasone (DECADRON) 4 MG tablet Take 5 tabs the evening before chemo and 5 tabs the morning of chemo, every 21 days by mouth with pills 30 tablet 1  . ibuprofen (ADVIL,MOTRIN) 200 MG tablet Take 200 mg  by mouth every 6 (six) hours as needed for mild pain or moderate pain. Pain     . lidocaine-prilocaine (EMLA) cream Apply to affected area once 30 g 3  . ondansetron (ZOFRAN) 8 MG tablet Take 1 tablet (8 mg total) by mouth every 8 (eight) hours as needed for  refractory nausea / vomiting. Start on day 3 after chemo. 30 tablet 1  . oxyCODONE (OXY IR/ROXICODONE) 5 MG immediate release tablet Take 1 tablet (5 mg total) by mouth every 4 (four) hours as needed for severe pain. 60 tablet 0  . prochlorperazine (COMPAZINE) 10 MG tablet Take 1 tablet (10 mg total) every 6 (six) hours as needed by mouth (Nausea or vomiting). 30 tablet 1   No current facility-administered medications for this visit.     PHYSICAL EXAMINATION: ECOG PERFORMANCE STATUS: 2 - Symptomatic, <50% confined to bed  Vitals:   05/16/17 0901  BP: 121/63  Pulse: 91  Resp: 18  Temp: 97.9 F (36.6 C)  SpO2: 99%   Filed Weights   05/16/17 0901  Weight: 108 lb 6.4 oz (49.2 kg)    GENERAL:alert, no distress and comfortable.  She looks thin and mildly cachectic SKIN: skin color, texture, turgor are normal, no rashes or significant lesions EYES: normal, Conjunctiva are pink and non-injected, sclera clear OROPHARYNX:no exudate, no erythema and lips, buccal mucosa, and tongue normal  NECK: supple, thyroid normal size, non-tender, without nodularity LYMPH:  no palpable lymphadenopathy in the cervical, axillary or inguinal LUNGS: clear to auscultation and percussion with normal breathing effort HEART: regular rate & rhythm and no murmurs and no lower extremity edema ABDOMEN:abdomen soft, mild discomfort on palpation, swollen with ascitic fluid Musculoskeletal:no cyanosis of digits and no clubbing  NEURO: alert & oriented x 3 with fluent speech, no focal motor/sensory deficits  LABORATORY DATA:  I have reviewed the data as listed    Component Value Date/Time   NA 140 05/15/2017 1026   K 4.3 05/15/2017 1026   CL 104 05/15/2017 1026   CO2 26 05/15/2017 1026   GLUCOSE 69 05/15/2017 1026   GLUCOSE 81 05/18/2006 1112   BUN 16 05/15/2017 1026   CREATININE 0.97 05/15/2017 1026   CALCIUM 8.5 (L) 05/15/2017 1026   PROT 6.9 05/15/2017 1026   ALBUMIN 3.2 (L) 05/15/2017 1026   AST 25  05/15/2017 1026   ALT 26 05/15/2017 1026   ALKPHOS 50 05/15/2017 1026   BILITOT 0.8 05/15/2017 1026   GFRNONAA 57 (L) 05/15/2017 1026   GFRAA >60 05/15/2017 1026    No results found for: SPEP, UPEP  Lab Results  Component Value Date   WBC 10.8 (H) 05/15/2017   NEUTROABS 8.7 (H) 05/15/2017   HGB 10.6 (L) 05/15/2017   HCT 31.0 (L) 05/15/2017   MCV 87.1 05/15/2017   PLT 520 (H) 05/15/2017      Chemistry      Component Value Date/Time   NA 140 05/15/2017 1026   K 4.3 05/15/2017 1026   CL 104 05/15/2017 1026   CO2 26 05/15/2017 1026   BUN 16 05/15/2017 1026   CREATININE 0.97 05/15/2017 1026      Component Value Date/Time   CALCIUM 8.5 (L) 05/15/2017 1026   ALKPHOS 50 05/15/2017 1026   AST 25 05/15/2017 1026   ALT 26 05/15/2017 1026   BILITOT 0.8 05/15/2017 1026       RADIOGRAPHIC STUDIES: I have personally reviewed the radiological images as listed and agreed with the findings in the report. Ct Abdomen  Pelvis W Contrast  Result Date: 04/25/2017 CLINICAL DATA:  Pelvic pain and pressure. Swelling and bloating. History of prior donation of the right kidney. EXAM: CT ABDOMEN AND PELVIS WITH CONTRAST TECHNIQUE: Multidetector CT imaging of the abdomen and pelvis was performed using the standard protocol following bolus administration of intravenous contrast. CONTRAST:  163m ISOVUE-300 IOPAMIDOL (ISOVUE-300) INJECTION 61% [facility] [address] Results: Creatinine 0.9 mg/dL. COMPARISON:  Hepatobiliary ultrasound dated 09/12/2013 FINDINGS: Lower chest: Unremarkable Hepatobiliary: A new capsular mass along the dome of the right hepatic lobe slightly indents the parenchyma on image 32/3, and measures 2.5 by 2.3 by 1.4 cm. Several tiny hypodense lesions in the lateral segment left hepatic lobe in the to 3 mm range are shown on images 11-12 of series 2 and are probably small benign cysts or similar lesions. Mild intrahepatic biliary  dilatation. The common hepatic duct measures proximally 7 mm in diameter in the common bile duct also about 7 mm. Slight truncation of the distal CBD in the vicinity of the ampulla, significance uncertain. Gallbladder mildly contracted but otherwise unremarkable. Pancreas: Unremarkable Spleen: Unremarkable Adrenals/Urinary Tract: Adrenal glands normal. Several miniscule hypodense lesions of the left kidney are likely tiny cysts and there are also small parapelvic cysts of the left kidney. Right kidney surgically absent. No hydronephrosis. Much of the left ureter is indistinct. Urinary bladder is empty. Stomach/Bowel: Mild prominence of stool in the proximal colon extending through to the level of the transverse colon, the distal colon is nondistended. No dilated bowel. Vascular/Lymphatic: Aortoiliac atherosclerotic vascular disease. No pathologic retroperitoneal adenopathy. Reproductive: Dorsal to the uterus there is a 12.7 by 7.1 by 7.3 cm primarily solid mass with cystic elements, some eccentricity to the right side, probably a right ovarian malignancy. Admittedly this extends over into the left adnexa and a left-sided origin is not totally excluded. The mass is intimately associated with the dorsal uterus and also abuts the rectosigmoid. Bowel invasion not excluded. Other: There is extensive omental caking of tumor, most notable in the left abdomen about at the level of the iliac crest or local omental caking is up to 2.9 cm in thickness on image 42/2. Scattered ascites noted with some enhancement along the margin of the ascites compatible with peritoneal spread of malignancy. Scattered tumor in the mesentery, for example in the right colon mesentery where a 2.9 by 2.2 cm ill-defined deposit of tumor is shown on image 40/2. The appearance is compatible with widespread peritoneal and omental metastatic disease. New scattered additional deposits are identified. Musculoskeletal: Unremarkable IMPRESSION: 1.  Appearance compatible with ovarian malignancy with widespread peritoneal, mesenteric, and omental metastatic disease. Specifically, a 12.7 cm primarily solid and partially cystic mass above the uterus probably arises from the right ovary and is associated with omental caking, complex ascites with peritoneal metastatic deposits, and mesenteric deposits of tumor. 2. Mild intrahepatic biliary dilatation. The extrahepatic biliary tree is within normal limits. 3.  Aortic Atherosclerosis (ICD10-I70.0). Electronically Signed   By: WVan ClinesM.D.   On: 04/25/2017 14:09   Ir [place] Vasc Access Right  Result Date: 05/15/2017 INDICATION: History of ovarian cancer. In need of durable intravenous access for chemotherapy administration. EXAM: IMPLANTED PORT A CATH PLACEMENT WITH ULTRASOUND AND FLUOROSCOPIC GUIDANCE COMPARISON:  None. MEDICATIONS: Ancef 2 gm IV; The antibiotic was administered within an appropriate time interval prior to skin puncture. ANESTHESIA/SEDATION: Moderate (conscious) sedation was employed during this procedure. A total of Versed 4 mg  and Fentanyl 100 mcg was administered intravenously. Moderate Sedation Time: 25 minutes. The patient's level of consciousness and vital signs were monitored continuously by radiology nursing throughout the procedure under my direct supervision. CONTRAST:  None FLUOROSCOPY TIME:  24 seconds (1 mGy) COMPLICATIONS: None immediate. PROCEDURE: The procedure, risks, benefits, and alternatives were explained to the patient. Questions regarding the procedure were encouraged and answered. The patient understands and consents to the procedure. The right neck and chest were prepped with chlorhexidine in a sterile fashion, and a sterile drape was applied covering the operative field. Maximum barrier sterile technique with sterile gowns and gloves were used for the procedure. A timeout was performed prior to the initiation of the procedure. Local anesthesia was provided  with 1% lidocaine with epinephrine. After creating a small venotomy incision, a micropuncture kit was utilized to access the internal jugular vein. Real-time ultrasound guidance was utilized for vascular access including the acquisition of a permanent ultrasound image documenting patency of the accessed vessel. The microwire was utilized to measure appropriate catheter length. A subcutaneous port pocket was then created along the upper chest wall utilizing a combination of sharp and blunt dissection. The pocket was irrigated with sterile saline. A single lumen thin power injectable port was chosen for placement. The 8 Fr catheter was tunneled from the port pocket site to the venotomy incision. The port was placed in the pocket. The external catheter was trimmed to appropriate length. At the venotomy, an 8 Fr peel-away sheath was placed over a guidewire under fluoroscopic guidance. The catheter was then placed through the sheath and the sheath was removed. Final catheter positioning was confirmed and documented with a fluoroscopic spot radiograph. The port was accessed with a Huber needle, aspirated and flushed with heparinized saline. The venotomy site was closed with an interrupted 4-0 Vicryl suture. The port pocket incision was closed with interrupted 2-0 Vicryl suture and the skin was opposed with a running subcuticular 4-0 Vicryl suture. Dermabond and Steri-strips were applied to both incisions. Dressings were placed. The patient tolerated the procedure well without immediate post procedural complication. FINDINGS: After catheter placement, the tip lies within the superior cavoatrial junction. The catheter aspirates and flushes normally and is ready for immediate use. IMPRESSION: Successful placement of a right internal jugular approach power injectable Port-A-Cath. The catheter is ready for immediate use. Electronically Signed   By: Sandi Mariscal M.D.   On: 05/15/2017 14:00   Ct Biopsy  Result Date:  05/01/2017 INDICATION: Omental mass, ovarian mass with ascites. Imaging findings concerning for metastatic ovarian cancer. EXAM: CT GUIDED CORE BIOPSY OF THE LEFT OMENTAL MASS MEDICATIONS: 1% lidocaine local ANESTHESIA/SEDATION: Moderate (conscious) sedation was employed during this procedure. A total of Versed 1.5 mg and Fentanyl 75 mcg was administered intravenously. Moderate Sedation Time: 13 minutes. The patient's level of consciousness and vital signs were monitored continuously by radiology nursing throughout the procedure under my direct supervision. FLUOROSCOPY TIME:  Fluoroscopy Time: None. COMPLICATIONS: None immediate. PROCEDURE: Informed written consent was obtained from the patient after a thorough discussion of the procedural risks, benefits and alternatives. All questions were addressed. Maximal Sterile Barrier Technique was utilized including caps, mask, sterile gowns, sterile gloves, sterile drape, hand hygiene and skin antiseptic. A timeout was performed prior to the initiation of the procedure. Previous imaging reviewed. Patient positioned supine. Noncontrast localization CT performed. The left abdominal omental mass was localized. Overlying skin marked. Under sterile conditions and local anesthesia, a 17 gauge 6.8 cm access needle was advanced  into the left omental mass with CT guidance. Needle position confirmed with CT. Several 18 gauge core biopsies obtained. Samples placed in formalin. Needle removed. Patient tolerated the biopsy well. No immediate complication. IMPRESSION: Successful CT-guided left abdominal omental mass 18 gauge core biopsies Electronically Signed   By: Jerilynn Mages.  Shick M.D.   On: 05/01/2017 12:55   Ir Fluoro Guide Port Insertion Right  Result Date: 05/15/2017 INDICATION: History of ovarian cancer. In need of durable intravenous access for chemotherapy administration. EXAM: IMPLANTED PORT A CATH PLACEMENT WITH ULTRASOUND AND FLUOROSCOPIC GUIDANCE COMPARISON:  None.  MEDICATIONS: Ancef 2 gm IV; The antibiotic was administered within an appropriate time interval prior to skin puncture. ANESTHESIA/SEDATION: Moderate (conscious) sedation was employed during this procedure. A total of Versed 4 mg and Fentanyl 100 mcg was administered intravenously. Moderate Sedation Time: 25 minutes. The patient's level of consciousness and vital signs were monitored continuously by radiology nursing throughout the procedure under my direct supervision. CONTRAST:  None FLUOROSCOPY TIME:  24 seconds (1 mGy) COMPLICATIONS: None immediate. PROCEDURE: The procedure, risks, benefits, and alternatives were explained to the patient. Questions regarding the procedure were encouraged and answered. The patient understands and consents to the procedure. The right neck and chest were prepped with chlorhexidine in a sterile fashion, and a sterile drape was applied covering the operative field. Maximum barrier sterile technique with sterile gowns and gloves were used for the procedure. A timeout was performed prior to the initiation of the procedure. Local anesthesia was provided with 1% lidocaine with epinephrine. After creating a small venotomy incision, a micropuncture kit was utilized to access the internal jugular vein. Real-time ultrasound guidance was utilized for vascular access including the acquisition of a permanent ultrasound image documenting patency of the accessed vessel. The microwire was utilized to measure appropriate catheter length. A subcutaneous port pocket was then created along the upper chest wall utilizing a combination of sharp and blunt dissection. The pocket was irrigated with sterile saline. A single lumen thin power injectable port was chosen for placement. The 8 Fr catheter was tunneled from the port pocket site to the venotomy incision. The port was placed in the pocket. The external catheter was trimmed to appropriate length. At the venotomy, an 8 Fr peel-away sheath was placed  over a guidewire under fluoroscopic guidance. The catheter was then placed through the sheath and the sheath was removed. Final catheter positioning was confirmed and documented with a fluoroscopic spot radiograph. The port was accessed with a Huber needle, aspirated and flushed with heparinized saline. The venotomy site was closed with an interrupted 4-0 Vicryl suture. The port pocket incision was closed with interrupted 2-0 Vicryl suture and the skin was opposed with a running subcuticular 4-0 Vicryl suture. Dermabond and Steri-strips were applied to both incisions. Dressings were placed. The patient tolerated the procedure well without immediate post procedural complication. FINDINGS: After catheter placement, the tip lies within the superior cavoatrial junction. The catheter aspirates and flushes normally and is ready for immediate use. IMPRESSION: Successful placement of a right internal jugular approach power injectable Port-A-Cath. The catheter is ready for immediate use. Electronically Signed   By: Sandi Mariscal M.D.   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 The few pounds of weight gain is likely due to ascites Clinically, I think she can wait and see how she feels after chemotherapy If her symptoms does not improve, she may  need repeat paracentesis again soon. 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  All questions were answered. The patient knows to call the clinic with any problems, questions or concerns. No barriers to learning was detected. I spent 15 minutes counseling the patient face to face. The total time spent in the appointment was 20 minutes and more than 50% was on counseling and review of test results     Heath Lark, MD 05/16/2017 10:36 AM

## 2017-05-16 NOTE — Assessment & Plan Note (Signed)
We discussed pain management I recommend oxycodone as needed I warned about risk of sedation, nausea and constipation

## 2017-05-16 NOTE — Patient Instructions (Signed)
[facility] Discharge Instructions for Patients Receiving Chemotherapy  Today you received the following chemotherapy agents Taxol, Carboplatin.   To help prevent nausea and vomiting after your treatment, we encourage you to take your nausea medication as prescribed.     If you develop nausea and vomiting that is not controlled by your nausea medication, call the clinic.   BELOW ARE SYMPTOMS THAT SHOULD BE REPORTED IMMEDIATELY:  *FEVER GREATER THAN 100.5 F  *CHILLS WITH OR WITHOUT FEVER  NAUSEA AND VOMITING THAT IS NOT CONTROLLED WITH YOUR NAUSEA MEDICATION  *UNUSUAL SHORTNESS OF BREATH  *UNUSUAL BRUISING OR BLEEDING  TENDERNESS IN MOUTH AND THROAT WITH OR WITHOUT PRESENCE OF ULCERS  *URINARY PROBLEMS  *BOWEL PROBLEMS  UNUSUAL RASH Items with * indicate a potential emergency and should be followed up as soon as possible.  Feel free to call the clinic should you have any questions or concerns. The clinic phone number is [phone].  Please show the CHEMO ALERT CARD at check-in to the Emergency Department and triage nurse.     Paclitaxel injection What is this medicine? PACLITAXEL (PAK li TAX el) is a chemotherapy drug. It targets fast dividing cells, like cancer cells, and causes these cells to die. This medicine is used to treat ovarian cancer, breast cancer, and other cancers. This medicine may be used for other purposes; ask your health care provider or pharmacist if you have questions. COMMON BRAND NAME(S): Onxol, Taxol What should I tell my health care provider before I take this medicine? They need to know if you have any of these conditions: -blood disorders -irregular heartbeat -infection (especially a virus infection such as chickenpox, cold sores, or herpes) -liver disease -previous or ongoing radiation therapy -an unusual or allergic reaction to paclitaxel, alcohol, polyoxyethylated castor oil, other chemotherapy agents, other medicines,  foods, dyes, or preservatives -pregnant or trying to get pregnant -breast-feeding How should I use this medicine? This drug is given as an infusion into a vein. It is administered in a hospital or clinic by a specially trained health care professional. Talk to your pediatrician regarding the use of this medicine in children. Special care may be needed. Overdosage: If you think you have taken too much of this medicine contact a poison control center or emergency room at once. NOTE: This medicine is only for you. Do not share this medicine with others. What if I miss a dose? It is important not to miss your dose. Call your doctor or health care professional if you are unable to keep an appointment. What may interact with this medicine? Do not take this medicine with any of the following medications: -disulfiram -metronidazole This medicine may also interact with the following medications: -cyclosporine -diazepam -ketoconazole -medicines to increase blood counts like filgrastim, pegfilgrastim, sargramostim -other chemotherapy drugs like cisplatin, doxorubicin, epirubicin, etoposide, teniposide, vincristine -quinidine -testosterone -vaccines -verapamil Talk to your doctor or health care professional before taking any of these medicines: -acetaminophen -aspirin -ibuprofen -ketoprofen -naproxen This list may not describe all possible interactions. Give your health care provider a list of all the medicines, herbs, non-prescription drugs, or dietary supplements you use. Also tell them if you smoke, drink alcohol, or use illegal drugs. Some items may interact with your medicine. What should I watch for while using this medicine? Your condition will be monitored carefully while you are receiving this medicine. You will need important blood work done while you are taking this medicine. This medicine can cause serious allergic reactions. To reduce   your risk you will need to take other  medicine(s) before treatment with this medicine. If you experience allergic reactions like skin rash, itching or hives, swelling of the face, lips, or tongue, tell your doctor or health care professional right away. In some cases, you may be given additional medicines to help with side effects. Follow all directions for their use. This drug may make you feel generally unwell. This is not uncommon, as chemotherapy can affect healthy cells as well as cancer cells. Report any side effects. Continue your course of treatment even though you feel ill unless your doctor tells you to stop. Call your doctor or health care professional for advice if you get a fever, chills or sore throat, or other symptoms of a cold or flu. Do not treat yourself. This drug decreases your body's ability to fight infections. Try to avoid being around people who are sick. This medicine may increase your risk to bruise or bleed. Call your doctor or health care professional if you notice any unusual bleeding. Be careful brushing and flossing your teeth or using a toothpick because you may get an infection or bleed more easily. If you have any dental work done, tell your dentist you are receiving this medicine. Avoid taking products that contain aspirin, acetaminophen, ibuprofen, naproxen, or ketoprofen unless instructed by your doctor. These medicines may hide a fever. Do not become pregnant while taking this medicine. Women should inform their doctor if they wish to become pregnant or think they might be pregnant. There is a potential for serious side effects to an unborn child. Talk to your health care professional or pharmacist for more information. Do not breast-feed an infant while taking this medicine. Men are advised not to father a child while receiving this medicine. This product may contain alcohol. Ask your pharmacist or healthcare provider if this medicine contains alcohol. Be sure to tell all healthcare providers you are  taking this medicine. Certain medicines, like metronidazole and disulfiram, can cause an unpleasant reaction when taken with alcohol. The reaction includes flushing, headache, nausea, vomiting, sweating, and increased thirst. The reaction can last from 30 minutes to several hours. What side effects may I notice from receiving this medicine? Side effects that you should report to your doctor or health care professional as soon as possible: -allergic reactions like skin rash, itching or hives, swelling of the face, lips, or tongue -low blood counts - This drug may decrease the number of white blood cells, red blood cells and platelets. You may be at increased risk for infections and bleeding. -signs of infection - fever or chills, cough, sore throat, pain or difficulty passing urine -signs of decreased platelets or bleeding - bruising, pinpoint red spots on the skin, black, tarry stools, nosebleeds -signs of decreased red blood cells - unusually weak or tired, fainting spells, lightheadedness -breathing problems -chest pain -high or low blood pressure -mouth sores -nausea and vomiting -pain, swelling, redness or irritation at the injection site -pain, tingling, numbness in the hands or feet -slow or irregular heartbeat -swelling of the ankle, feet, hands Side effects that usually do not require medical attention (report to your doctor or health care professional if they continue or are bothersome): -bone pain -complete hair loss including hair on your head, underarms, pubic hair, eyebrows, and eyelashes -changes in the color of fingernails -diarrhea -loosening of the fingernails -loss of appetite -muscle or joint pain -red flush to skin -sweating This list may not describe all possible side effects. Call   your doctor for medical advice about side effects. You may report side effects to FDA at 1-800-FDA-1088. Where should I keep my medicine? This drug is given in a hospital or clinic and  will not be stored at home. NOTE: This sheet is a summary. It may not cover all possible information. If you have questions about this medicine, talk to your doctor, pharmacist, or health care provider.  2018 Elsevier/Gold Standard (2015-04-20 19:58:00)      Carboplatin injection What is this medicine? CARBOPLATIN (KAR boe pla tin) is a chemotherapy drug. It targets fast dividing cells, like cancer cells, and causes these cells to die. This medicine is used to treat ovarian cancer and many other cancers. This medicine may be used for other purposes; ask your health care provider or pharmacist if you have questions. COMMON BRAND NAME(S): Paraplatin What should I tell my health care provider before I take this medicine? They need to know if you have any of these conditions: -blood disorders -hearing problems -kidney disease -recent or ongoing radiation therapy -an unusual or allergic reaction to carboplatin, cisplatin, other chemotherapy, other medicines, foods, dyes, or preservatives -pregnant or trying to get pregnant -breast-feeding How should I use this medicine? This drug is usually given as an infusion into a vein. It is administered in a hospital or clinic by a specially trained health care professional. Talk to your pediatrician regarding the use of this medicine in children. Special care may be needed. Overdosage: If you think you have taken too much of this medicine contact a poison control center or emergency room at once. NOTE: This medicine is only for you. Do not share this medicine with others. What if I miss a dose? It is important not to miss a dose. Call your doctor or health care professional if you are unable to keep an appointment. What may interact with this medicine? -medicines for seizures -medicines to increase blood counts like filgrastim, pegfilgrastim, sargramostim -some antibiotics like amikacin, gentamicin, neomycin, streptomycin,  tobramycin -vaccines Talk to your doctor or health care professional before taking any of these medicines: -acetaminophen -aspirin -ibuprofen -ketoprofen -naproxen This list may not describe all possible interactions. Give your health care provider a list of all the medicines, herbs, non-prescription drugs, or dietary supplements you use. Also tell them if you smoke, drink alcohol, or use illegal drugs. Some items may interact with your medicine. What should I watch for while using this medicine? Your condition will be monitored carefully while you are receiving this medicine. You will need important blood work done while you are taking this medicine. This drug may make you feel generally unwell. This is not uncommon, as chemotherapy can affect healthy cells as well as cancer cells. Report any side effects. Continue your course of treatment even though you feel ill unless your doctor tells you to stop. In some cases, you may be given additional medicines to help with side effects. Follow all directions for their use. Call your doctor or health care professional for advice if you get a fever, chills or sore throat, or other symptoms of a cold or flu. Do not treat yourself. This drug decreases your body's ability to fight infections. Try to avoid being around people who are sick. This medicine may increase your risk to bruise or bleed. Call your doctor or health care professional if you notice any unusual bleeding. Be careful brushing and flossing your teeth or using a toothpick because you may get an infection or bleed more easily. If   you have any dental work done, tell your dentist you are receiving this medicine. Avoid taking products that contain aspirin, acetaminophen, ibuprofen, naproxen, or ketoprofen unless instructed by your doctor. These medicines may hide a fever. Do not become pregnant while taking this medicine. Women should inform their doctor if they wish to become pregnant or think  they might be pregnant. There is a potential for serious side effects to an unborn child. Talk to your health care professional or pharmacist for more information. Do not breast-feed an infant while taking this medicine. What side effects may I notice from receiving this medicine? Side effects that you should report to your doctor or health care professional as soon as possible: -allergic reactions like skin rash, itching or hives, swelling of the face, lips, or tongue -signs of infection - fever or chills, cough, sore throat, pain or difficulty passing urine -signs of decreased platelets or bleeding - bruising, pinpoint red spots on the skin, black, tarry stools, nosebleeds -signs of decreased red blood cells - unusually weak or tired, fainting spells, lightheadedness -breathing problems -changes in hearing -changes in vision -chest pain -high blood pressure -low blood counts - This drug may decrease the number of white blood cells, red blood cells and platelets. You may be at increased risk for infections and bleeding. -nausea and vomiting -pain, swelling, redness or irritation at the injection site -pain, tingling, numbness in the hands or feet -problems with balance, talking, walking -trouble passing urine or change in the amount of urine Side effects that usually do not require medical attention (report to your doctor or health care professional if they continue or are bothersome): -hair loss -loss of appetite -metallic taste in the mouth or changes in taste This list may not describe all possible side effects. Call your doctor for medical advice about side effects. You may report side effects to FDA at 1-800-FDA-1088. Where should I keep my medicine? This drug is given in a hospital or clinic and will not be stored at home. NOTE: This sheet is a summary. It may not cover all possible information. If you have questions about this medicine, talk to your doctor, pharmacist, or health care  provider.  2018 Elsevier/Gold Standard (2007-09-24 14:38:05)     

## 2017-05-17 ENCOUNTER — Telehealth: Payer: Self-pay

## 2017-05-17 NOTE — Telephone Encounter (Signed)
Called for follow up after first treatment yesterday. She is doing good. She did have urinary leakage yesterday with treatment, she said she was drinking a lot and getting IV fluids. She said the urinary leakage was new. It is better today.  Instructed to call for questions or concerns.

## 2017-05-17 NOTE — Telephone Encounter (Signed)
-----   Message from Cyndia Bent, RN sent at 05/16/2017  4:08 PM EST ----- Regarding: First Time Dr. Alvy Bimler  First Time Taxol 3hr, Carboplatin.

## 2017-05-17 NOTE — Telephone Encounter (Signed)
Very unusual symptoms Observe for now

## 2017-05-18 ENCOUNTER — Ambulatory Visit (HOSPITAL_BASED_OUTPATIENT_CLINIC_OR_DEPARTMENT_OTHER): Payer: Medicare Other

## 2017-05-18 VITALS — BP 125/71 | HR 70 | Temp 97.9°F | Resp 18

## 2017-05-18 DIAGNOSIS — C801 Malignant (primary) neoplasm, unspecified: Secondary | ICD-10-CM

## 2017-05-18 DIAGNOSIS — C786 Secondary malignant neoplasm of retroperitoneum and peritoneum: Secondary | ICD-10-CM

## 2017-05-18 DIAGNOSIS — Z5189 Encounter for other specified aftercare: Secondary | ICD-10-CM

## 2017-05-18 DIAGNOSIS — C561 Malignant neoplasm of right ovary: Secondary | ICD-10-CM

## 2017-05-18 MED ORDER — PEGFILGRASTIM INJECTION 6 MG/0.6ML ~~LOC~~
6.0000 mg | PREFILLED_SYRINGE | Freq: Once | SUBCUTANEOUS | Status: AC
  Administered 2017-05-18: 6 mg via SUBCUTANEOUS
  Filled 2017-05-18: qty 0.6

## 2017-05-18 NOTE — Patient Instructions (Signed)
Pegfilgrastim injection What is this medicine? PEGFILGRASTIM (PEG fil gra stim) is a long-acting granulocyte colony-stimulating factor that stimulates the growth of neutrophils, a type of white blood cell important in the body's fight against infection. It is used to reduce the incidence of fever and infection in patients with certain types of cancer who are receiving chemotherapy that affects the bone marrow, and to increase survival after being exposed to high doses of radiation. This medicine may be used for other purposes; ask your health care provider or pharmacist if you have questions. COMMON BRAND NAME(S): Neulasta What should I tell my health care provider before I take this medicine? They need to know if you have any of these conditions: -kidney disease -latex allergy -ongoing radiation therapy -sickle cell disease -skin reactions to acrylic adhesives (On-Body Injector only) -an unusual or allergic reaction to pegfilgrastim, filgrastim, other medicines, foods, dyes, or preservatives -pregnant or trying to get pregnant -breast-feeding How should I use this medicine? This medicine is for injection under the skin. If you get this medicine at home, you will be taught how to prepare and give the pre-filled syringe or how to use the On-body Injector. Refer to the patient Instructions for Use for detailed instructions. Use exactly as directed. Tell your healthcare provider immediately if you suspect that the On-body Injector may not have performed as intended or if you suspect the use of the On-body Injector resulted in a missed or partial dose. It is important that you put your used needles and syringes in a special sharps container. Do not put them in a trash can. If you do not have a sharps container, call your pharmacist or healthcare provider to get one. Talk to your pediatrician regarding the use of this medicine in children. While this drug may be prescribed for selected conditions,  precautions do apply. Overdosage: If you think you have taken too much of this medicine contact a poison control center or emergency room at once. NOTE: This medicine is only for you. Do not share this medicine with others. What if I miss a dose? It is important not to miss your dose. Call your doctor or health care professional if you miss your dose. If you miss a dose due to an On-body Injector failure or leakage, a new dose should be administered as soon as possible using a single prefilled syringe for manual use. What may interact with this medicine? Interactions have not been studied. Give your health care provider a list of all the medicines, herbs, non-prescription drugs, or dietary supplements you use. Also tell them if you smoke, drink alcohol, or use illegal drugs. Some items may interact with your medicine. This list may not describe all possible interactions. Give your health care provider a list of all the medicines, herbs, non-prescription drugs, or dietary supplements you use. Also tell them if you smoke, drink alcohol, or use illegal drugs. Some items may interact with your medicine. What should I watch for while using this medicine? You may need blood work done while you are taking this medicine. If you are going to need a MRI, CT scan, or other procedure, tell your doctor that you are using this medicine (On-Body Injector only). What side effects may I notice from receiving this medicine? Side effects that you should report to your doctor or health care professional as soon as possible: -allergic reactions like skin rash, itching or hives, swelling of the face, lips, or tongue -dizziness -fever -pain, redness, or irritation at site   where injected -pinpoint red spots on the skin -red or dark-brown urine -shortness of breath or breathing problems -stomach or side pain, or pain at the shoulder -swelling -tiredness -trouble passing urine or change in the amount of urine Side  effects that usually do not require medical attention (report to your doctor or health care professional if they continue or are bothersome): -bone pain -muscle pain This list may not describe all possible side effects. Call your doctor for medical advice about side effects. You may report side effects to FDA at 1-800-FDA-1088. Where should I keep my medicine? Keep out of the reach of children. Store pre-filled syringes in a refrigerator between 2 and 8 degrees C (36 and 46 degrees F). Do not freeze. Keep in carton to protect from light. Throw away this medicine if it is left out of the refrigerator for more than 48 hours. Throw away any unused medicine after the expiration date. NOTE: This sheet is a summary. It may not cover all possible information. If you have questions about this medicine, talk to your doctor, pharmacist, or health care provider.  2018 Elsevier/Gold Standard (2016-06-15 12:58:03)  

## 2017-05-28 ENCOUNTER — Encounter: Payer: Self-pay | Admitting: Gynecologic Oncology

## 2017-05-28 NOTE — Progress Notes (Signed)
Gynecologic Oncology Multi-Disciplinary Disposition Conference Note  Date of the Conference: May 28, 2017  Patient Name: Colleen Mckinney  Referring Provider: Dr. Pamala Hurry Primary GYN Oncologist: Dr. 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  Disposition is to chemotherapy with possible consideration of debulking after three cycles with imaging.   This Multidisciplinary conference took place involving physicians from [place], [place], Radiation Oncology, Pathology, Radiology along with the Gynecologic Oncology Nurse Practitioner and RN.  Comprehensive assessment of the patient's malignancy, staging, need for surgery, chemotherapy, radiation therapy, and need for further testing were reviewed. Supportive measures, both inpatient and following discharge were also discussed. The recommended plan of care is documented. Greater than 35 minutes were spent correlating and coordinating this patient's care.

## 2017-06-06 ENCOUNTER — Ambulatory Visit (HOSPITAL_BASED_OUTPATIENT_CLINIC_OR_DEPARTMENT_OTHER): Payer: Medicare Other | Admitting: Hematology and Oncology

## 2017-06-06 ENCOUNTER — Telehealth: Payer: Self-pay | Admitting: Hematology and Oncology

## 2017-06-06 ENCOUNTER — Other Ambulatory Visit (HOSPITAL_BASED_OUTPATIENT_CLINIC_OR_DEPARTMENT_OTHER): Payer: Medicare Other

## 2017-06-06 ENCOUNTER — Encounter: Payer: Self-pay | Admitting: Hematology and Oncology

## 2017-06-06 ENCOUNTER — Ambulatory Visit (HOSPITAL_BASED_OUTPATIENT_CLINIC_OR_DEPARTMENT_OTHER): Payer: Medicare Other

## 2017-06-06 ENCOUNTER — Ambulatory Visit: Payer: Medicare Other

## 2017-06-06 DIAGNOSIS — C801 Malignant (primary) neoplasm, unspecified: Secondary | ICD-10-CM

## 2017-06-06 DIAGNOSIS — G620 Drug-induced polyneuropathy: Secondary | ICD-10-CM | POA: Diagnosis not present

## 2017-06-06 DIAGNOSIS — D6481 Anemia due to antineoplastic chemotherapy: Secondary | ICD-10-CM | POA: Diagnosis not present

## 2017-06-06 DIAGNOSIS — C561 Malignant neoplasm of right ovary: Secondary | ICD-10-CM

## 2017-06-06 DIAGNOSIS — Z5111 Encounter for antineoplastic chemotherapy: Secondary | ICD-10-CM

## 2017-06-06 DIAGNOSIS — T451X5A Adverse effect of antineoplastic and immunosuppressive drugs, initial encounter: Secondary | ICD-10-CM | POA: Insufficient documentation

## 2017-06-06 DIAGNOSIS — C786 Secondary malignant neoplasm of retroperitoneum and peritoneum: Secondary | ICD-10-CM | POA: Diagnosis not present

## 2017-06-06 DIAGNOSIS — K5909 Other constipation: Secondary | ICD-10-CM

## 2017-06-06 LAB — CBC WITH DIFFERENTIAL/PLATELET
BASO%: 0.1 % (ref 0.0–2.0)
BASOS ABS: 0 10*3/uL (ref 0.0–0.1)
EOS ABS: 0 10*3/uL (ref 0.0–0.5)
EOS%: 0 % (ref 0.0–7.0)
HCT: 28.4 % — ABNORMAL LOW (ref 34.8–46.6)
HEMOGLOBIN: 9.6 g/dL — AB (ref 11.6–15.9)
LYMPH%: 3.4 % — AB (ref 14.0–49.7)
MCH: 29.5 pg (ref 25.1–34.0)
MCHC: 33.8 g/dL (ref 31.5–36.0)
MCV: 87.4 fL (ref 79.5–101.0)
MONO#: 0.2 10*3/uL (ref 0.1–0.9)
MONO%: 0.9 % (ref 0.0–14.0)
NEUT#: 19.3 10*3/uL — ABNORMAL HIGH (ref 1.5–6.5)
NEUT%: 95.6 % — ABNORMAL HIGH (ref 38.4–76.8)
PLATELETS: 322 10*3/uL (ref 145–400)
RBC: 3.25 10*6/uL — AB (ref 3.70–5.45)
RDW: 14.4 % (ref 11.2–14.5)
WBC: 20.2 10*3/uL — ABNORMAL HIGH (ref 3.9–10.3)
lymph#: 0.7 10*3/uL — ABNORMAL LOW (ref 0.9–3.3)

## 2017-06-06 LAB — COMPREHENSIVE METABOLIC PANEL
ALBUMIN: 3.6 g/dL (ref 3.5–5.0)
ALK PHOS: 74 U/L (ref 40–150)
ALT: 20 U/L (ref 0–55)
ANION GAP: 10 meq/L (ref 3–11)
AST: 21 U/L (ref 5–34)
BUN: 17 mg/dL (ref 7.0–26.0)
CO2: 24 mEq/L (ref 22–29)
Calcium: 9.1 mg/dL (ref 8.4–10.4)
Chloride: 104 mEq/L (ref 98–109)
Creatinine: 0.9 mg/dL (ref 0.6–1.1)
GLUCOSE: 203 mg/dL — AB (ref 70–140)
POTASSIUM: 4.3 meq/L (ref 3.5–5.1)
SODIUM: 138 meq/L (ref 136–145)
Total Bilirubin: 0.23 mg/dL (ref 0.20–1.20)
Total Protein: 7.3 g/dL (ref 6.4–8.3)

## 2017-06-06 MED ORDER — SODIUM CHLORIDE 0.9 % IV SOLN
Freq: Once | INTRAVENOUS | Status: AC
  Administered 2017-06-06: 10:00:00 via INTRAVENOUS
  Filled 2017-06-06: qty 5

## 2017-06-06 MED ORDER — DIPHENHYDRAMINE HCL 50 MG/ML IJ SOLN
50.0000 mg | Freq: Once | INTRAMUSCULAR | Status: AC
  Administered 2017-06-06: 50 mg via INTRAVENOUS

## 2017-06-06 MED ORDER — SODIUM CHLORIDE 0.9% FLUSH
10.0000 mL | Freq: Once | INTRAVENOUS | Status: DC
  Filled 2017-06-06: qty 10

## 2017-06-06 MED ORDER — FAMOTIDINE IN NACL 20-0.9 MG/50ML-% IV SOLN
20.0000 mg | Freq: Once | INTRAVENOUS | Status: AC
  Administered 2017-06-06: 20 mg via INTRAVENOUS

## 2017-06-06 MED ORDER — SODIUM CHLORIDE 0.9% FLUSH
10.0000 mL | INTRAVENOUS | Status: DC | PRN
  Administered 2017-06-06: 10 mL
  Filled 2017-06-06: qty 10

## 2017-06-06 MED ORDER — SODIUM CHLORIDE 0.9 % IV SOLN
Freq: Once | INTRAVENOUS | Status: AC
  Administered 2017-06-06: 10:00:00 via INTRAVENOUS

## 2017-06-06 MED ORDER — SODIUM CHLORIDE 0.9 % IV SOLN
382.2000 mg | Freq: Once | INTRAVENOUS | Status: AC
  Administered 2017-06-06: 380 mg via INTRAVENOUS
  Filled 2017-06-06: qty 38

## 2017-06-06 MED ORDER — PACLITAXEL CHEMO INJECTION 300 MG/50ML
175.0000 mg/m2 | Freq: Once | INTRAVENOUS | Status: AC
  Administered 2017-06-06: 258 mg via INTRAVENOUS
  Filled 2017-06-06: qty 43

## 2017-06-06 MED ORDER — FAMOTIDINE IN NACL 20-0.9 MG/50ML-% IV SOLN
INTRAVENOUS | Status: AC
  Filled 2017-06-06: qty 50

## 2017-06-06 MED ORDER — HEPARIN SOD (PORK) LOCK FLUSH 100 UNIT/ML IV SOLN
500.0000 [IU] | Freq: Once | INTRAVENOUS | Status: AC | PRN
  Administered 2017-06-06: 500 [IU]
  Filled 2017-06-06: qty 5

## 2017-06-06 MED ORDER — PALONOSETRON HCL INJECTION 0.25 MG/5ML
0.2500 mg | Freq: Once | INTRAVENOUS | Status: AC
  Administered 2017-06-06: 0.25 mg via INTRAVENOUS

## 2017-06-06 MED ORDER — PALONOSETRON HCL INJECTION 0.25 MG/5ML
INTRAVENOUS | Status: AC
  Filled 2017-06-06: qty 5

## 2017-06-06 MED ORDER — DIPHENHYDRAMINE HCL 50 MG/ML IJ SOLN
INTRAMUSCULAR | Status: AC
  Filled 2017-06-06: qty 1

## 2017-06-06 NOTE — Assessment & Plan Note (Signed)
She had recent constipation secondary to side effects of antiemetic therapy We discussed laxative management

## 2017-06-06 NOTE — Assessment & Plan Note (Signed)

## 2017-06-06 NOTE — Telephone Encounter (Signed)
Scheduled appt per 12/5 los - patient to get an updated schedule  In the treatment area.

## 2017-06-06 NOTE — Assessment & Plan Note (Signed)
she has mild peripheral neuropathy, likely related to side effects of treatment. It is only mild, not bothering the patient. I will observe for now If it gets worse in the future, I will consider modifying the dose of the treatment  

## 2017-06-06 NOTE — Progress Notes (Signed)
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  She presented for evaluation of an ultrasound was performed which demonstrated a uterus measuring 6 x 5 cm with a 1.3 mm endometrial stripe.  Multiple heterogeneous pelvic masses were appreciated the right measured 8 x 5 cm and another measured 10 cm just posterior to the uterus CA-125 was obtained and returned a value of 404.8. She also reports early satiety for approximately 2 months and a 10 pound weight loss over the last year.  She also reports mild abdominal bloating and sensation of pelvic heaviness      04/25/2017 Imaging    Ct abdomen and pelvis: 1. Appearance compatible with ovarian malignancy with widespread peritoneal, mesenteric, and omental metastatic disease. Specifically, a 12.7 cm primarily solid and partially cystic mass above the uterus probably arises from the right ovary and is associated with omental caking, complex ascites with peritoneal metastatic deposits, and mesenteric deposits of tumor. 2. Mild intrahepatic biliary dilatation. The extrahepatic biliary tree is within normal limits. 3.  Aortic Atherosclerosis (ICD10-I70.0).      05/01/2017 Procedure    Successful CT-guided left abdominal omental mass 18 gauge core biopsies      05/01/2017 Pathology Results    Soft Tissue Needle Core Biopsy, Left omental - METASTATIC CARCINOMA, SEE COMMENT. 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 The catheter is ready for immediate use.      05/16/2017 -  Chemotherapy     She received neoadjuvant chemo with carbo/taxol       INTERVAL HISTORY: Please see below for problem oriented charting. She is seen prior to cycle 2 of treatment She had expected side effects from treatment such as mild reflux, possible nausea, constipation and mild peripheral neuropathy She also had mild bone pain after G-CSF injection well controlled with current prescription pain medicine as needed She has lost some weight but she attributed that to loss of appetite in the first few days after chemotherapy She is eating well Her abdominal distention and bloating had resolved Her bowel habits has improved last week  REVIEW OF SYSTEMS:   Constitutional: Denies fevers, chills  Eyes: Denies blurriness of vision Ears, nose, mouth, throat, and face: Denies mucositis or sore throat Respiratory: Denies cough, dyspnea or wheezes Cardiovascular: Denies palpitation, chest discomfort or lower extremity swelling Skin: Denies abnormal skin rashes Lymphatics: Denies new lymphadenopathy or easy bruising Neurological:Denies numbness, tingling or new weaknesses Behavioral/Psych: Mood is stable, no new changes  All other systems were reviewed with the patient and are negative.  I have reviewed the past medical history, past surgical history, social history and family history with the patient and they are unchanged from previous note.  ALLERGIES:  has No Known Allergies.  MEDICATIONS:  Current Outpatient Medications  Medication Sig Dispense Refill  . acetaminophen (TYLENOL) 500 MG tablet Take 500 mg by mouth every 6 (six) hours as needed for mild pain or moderate pain.     . Calcium Carbonate-Vitamin D (CALCIUM + D PO) Take 1 tablet by mouth daily.     Marland Kitchen  dexamethasone (DECADRON) 4 MG tablet Take 5 tabs the evening before chemo and 5 tabs the morning of chemo, every 21 days by mouth with pills 30 tablet 1  . ibuprofen (ADVIL,MOTRIN) 200 MG tablet Take 200 mg by mouth every 6 (six) hours as  needed for mild pain or moderate pain. Pain     . lidocaine-prilocaine (EMLA) cream Apply to affected area once 30 g 3  . ondansetron (ZOFRAN) 8 MG tablet Take 1 tablet (8 mg total) by mouth every 8 (eight) hours as needed for refractory nausea / vomiting. Start on day 3 after chemo. 30 tablet 1  . oxyCODONE (OXY IR/ROXICODONE) 5 MG immediate release tablet Take 1 tablet (5 mg total) by mouth every 4 (four) hours as needed for severe pain. 60 tablet 0  . prochlorperazine (COMPAZINE) 10 MG tablet Take 1 tablet (10 mg total) every 6 (six) hours as needed by mouth (Nausea or vomiting). 30 tablet 1   No current facility-administered medications for this visit.    Facility-Administered Medications Ordered in Other Visits  Medication Dose Route Frequency Provider Last Rate Last Dose  . CARBOplatin (PARAPLATIN) 380 mg in sodium chloride 0.9 % 250 mL chemo infusion  380 mg Intravenous Once Alvy Bimler, Edman Lipsey, MD      . diphenhydrAMINE (BENADRYL) injection 50 mg  50 mg Intravenous Once Alvy Bimler, Tatjana Turcott, MD      . famotidine (PEPCID) IVPB 20 mg premix  20 mg Intravenous Once Alvy Bimler, Lanisha Stepanian, MD      . fosaprepitant (EMEND) 150 mg, dexamethasone (DECADRON) 12 mg in sodium chloride 0.9 % 145 mL IVPB   Intravenous Once Alvy Bimler, Itzae Mccurdy, MD      . heparin lock flush 100 unit/mL  500 Units Intracatheter Once PRN Alvy Bimler, Cleotha Tsang, MD      . PACLitaxel (TAXOL) 258 mg in sodium chloride 0.9 % 250 mL chemo infusion (> 81m/m2)  175 mg/m2 (Treatment Plan Recorded) Intravenous Once Lavida Patch, MD      . sodium chloride flush (NS) 0.9 % injection 10 mL  10 mL Intracatheter PRN GAlvy Bimler Zylen Wenig, MD        PHYSICAL EXAMINATION: ECOG PERFORMANCE STATUS: 1 - Symptomatic but completely ambulatory  Vitals:   06/06/17 0855  BP: (!) 141/90  Pulse: 94  Resp: 18  Temp: 97.7 F (36.5 C)  SpO2: 100%   Filed Weights   06/06/17 0855  Weight: 102 lb 6.4 oz (46.4 kg)    GENERAL:alert, no distress and comfortable SKIN: skin color, texture, turgor  are normal, no rashes or significant lesions EYES: normal, Conjunctiva are pink and non-injected, sclera clear OROPHARYNX:no exudate, no erythema and lips, buccal mucosa, and tongue normal  NECK: supple, thyroid normal size, non-tender, without nodularity LYMPH:  no palpable lymphadenopathy in the cervical, axillary or inguinal LUNGS: clear to auscultation and percussion with normal breathing effort HEART: regular rate & rhythm and no murmurs and no lower extremity edema ABDOMEN:abdomen soft, non-tender and normal bowel sounds Musculoskeletal:no cyanosis of digits and no clubbing  NEURO: alert & oriented x 3 with fluent speech, no focal motor/sensory deficits  LABORATORY DATA:  I have reviewed the data as listed    Component Value Date/Time   NA 138 06/06/2017 0816   K 4.3 06/06/2017 0816   CL 104 05/15/2017 1026   CO2 24 06/06/2017 0816   GLUCOSE 203 (H) 06/06/2017 0816   GLUCOSE 81 05/18/2006 1112   BUN 17.0 06/06/2017 0816   CREATININE 0.9 06/06/2017 0816   CALCIUM 9.1 06/06/2017 0816  PROT 7.3 06/06/2017 0816   ALBUMIN 3.6 06/06/2017 0816   AST 21 06/06/2017 0816   ALT 20 06/06/2017 0816   ALKPHOS 74 06/06/2017 0816   BILITOT 0.23 06/06/2017 0816   GFRNONAA 57 (L) 05/15/2017 1026   GFRAA >60 05/15/2017 1026    No results found for: SPEP, UPEP  Lab Results  Component Value Date   WBC 20.2 (H) 06/06/2017   NEUTROABS 19.3 (H) 06/06/2017   HGB 9.6 (L) 06/06/2017   HCT 28.4 (L) 06/06/2017   MCV 87.4 06/06/2017   PLT 322 06/06/2017      Chemistry      Component Value Date/Time   NA 138 06/06/2017 0816   K 4.3 06/06/2017 0816   CL 104 05/15/2017 1026   CO2 24 06/06/2017 0816   BUN 17.0 06/06/2017 0816   CREATININE 0.9 06/06/2017 0816      Component Value Date/Time   CALCIUM 9.1 06/06/2017 0816   ALKPHOS 74 06/06/2017 0816   AST 21 06/06/2017 0816   ALT 20 06/06/2017 0816   BILITOT 0.23 06/06/2017 0816       RADIOGRAPHIC STUDIES: I have personally  reviewed the radiological images as listed and agreed with the findings in the report. Ir US Guide Vasc Access Right  Result Date: 05/15/2017 INDICATION: History of ovarian cancer. In need of durable intravenous access for chemotherapy administration. EXAM: IMPLANTED PORT A CATH PLACEMENT WITH ULTRASOUND AND FLUOROSCOPIC GUIDANCE COMPARISON:  None. MEDICATIONS: Ancef 2 gm IV; The antibiotic was administered within an appropriate time interval prior to skin puncture. ANESTHESIA/SEDATION: Moderate (conscious) sedation was employed during this procedure. A total of Versed 4 mg and Fentanyl 100 mcg was administered intravenously. Moderate Sedation Time: 25 minutes. The patient's level of consciousness and vital signs were monitored continuously by radiology nursing throughout the procedure under my direct supervision. CONTRAST:  None FLUOROSCOPY TIME:  24 seconds (1 mGy) COMPLICATIONS: None immediate. PROCEDURE: The procedure, risks, benefits, and alternatives were explained to the patient. Questions regarding the procedure were encouraged and answered. The patient understands and consents to the procedure. The right neck and chest were prepped with chlorhexidine in a sterile fashion, and a sterile drape was applied covering the operative field. Maximum barrier sterile technique with sterile gowns and gloves were used for the procedure. A timeout was performed prior to the initiation of the procedure. Local anesthesia was provided with 1% lidocaine with epinephrine. After creating a small venotomy incision, a micropuncture kit was utilized to access the internal jugular vein. Real-time ultrasound guidance was utilized for vascular access including the acquisition of a permanent ultrasound image documenting patency of the accessed vessel. The microwire was utilized to measure appropriate catheter length. A subcutaneous port pocket was then created along the upper chest wall utilizing a combination of sharp and blunt  dissection. The pocket was irrigated with sterile saline. A single lumen thin power injectable port was chosen for placement. The 8 Fr catheter was tunneled from the port pocket site to the venotomy incision. The port was placed in the pocket. The external catheter was trimmed to appropriate length. At the venotomy, an 8 Fr peel-away sheath was placed over a guidewire under fluoroscopic guidance. The catheter was then placed through the sheath and the sheath was removed. Final catheter positioning was confirmed and documented with a fluoroscopic spot radiograph. The port was accessed with a Huber needle, aspirated and flushed with heparinized saline. The venotomy site was closed with an interrupted 4-0 Vicryl suture. The port pocket incision  was closed with interrupted 2-0 Vicryl suture and the skin was opposed with a running subcuticular 4-0 Vicryl suture. Dermabond and Steri-strips were applied to both incisions. Dressings were placed. The patient tolerated the procedure well without immediate post procedural complication. FINDINGS: After catheter placement, the tip lies within the superior cavoatrial junction. The catheter aspirates and flushes normally and is ready for immediate use. IMPRESSION: Successful placement of a right internal jugular approach power injectable Port-A-Cath. The catheter is ready for immediate use. Electronically Signed   By: Sandi Mariscal M.D.   On: 05/15/2017 14:00   Ir Fluoro Guide Port Insertion Right  Result Date: 05/15/2017 INDICATION: History of ovarian cancer. In need of durable intravenous access for chemotherapy administration. EXAM: IMPLANTED PORT A CATH PLACEMENT WITH ULTRASOUND AND FLUOROSCOPIC GUIDANCE COMPARISON:  None. MEDICATIONS: Ancef 2 gm IV; The antibiotic was administered within an appropriate time interval prior to skin puncture. ANESTHESIA/SEDATION: Moderate (conscious) sedation was employed during this procedure. A total of Versed 4 mg and Fentanyl 100 mcg was  administered intravenously. Moderate Sedation Time: 25 minutes. The patient's level of consciousness and vital signs were monitored continuously by radiology nursing throughout the procedure under my direct supervision. CONTRAST:  None FLUOROSCOPY TIME:  24 seconds (1 mGy) COMPLICATIONS: None immediate. PROCEDURE: The procedure, risks, benefits, and alternatives were explained to the patient. Questions regarding the procedure were encouraged and answered. The patient understands and consents to the procedure. The right neck and chest were prepped with chlorhexidine in a sterile fashion, and a sterile drape was applied covering the operative field. Maximum barrier sterile technique with sterile gowns and gloves were used for the procedure. A timeout was performed prior to the initiation of the procedure. Local anesthesia was provided with 1% lidocaine with epinephrine. After creating a small venotomy incision, a micropuncture kit was utilized to access the internal jugular vein. Real-time ultrasound guidance was utilized for vascular access including the acquisition of a permanent ultrasound image documenting patency of the accessed vessel. The microwire was utilized to measure appropriate catheter length. A subcutaneous port pocket was then created along the upper chest wall utilizing a combination of sharp and blunt dissection. The pocket was irrigated with sterile saline. A single lumen thin power injectable port was chosen for placement. The 8 Fr catheter was tunneled from the port pocket site to the venotomy incision. The port was placed in the pocket. The external catheter was trimmed to appropriate length. At the venotomy, an 8 Fr peel-away sheath was placed over a guidewire under fluoroscopic guidance. The catheter was then placed through the sheath and the sheath was removed. Final catheter positioning was confirmed and documented with a fluoroscopic spot radiograph. The port was accessed with a Huber  needle, aspirated and flushed with heparinized saline. The venotomy site was closed with an interrupted 4-0 Vicryl suture. The port pocket incision was closed with interrupted 2-0 Vicryl suture and the skin was opposed with a running subcuticular 4-0 Vicryl suture. Dermabond and Steri-strips were applied to both incisions. Dressings were placed. The patient tolerated the procedure well without immediate post procedural complication. FINDINGS: After catheter placement, the tip lies within the superior cavoatrial junction. The catheter aspirates and flushes normally and is ready for immediate use. IMPRESSION: Successful placement of a right internal jugular approach power injectable Port-A-Cath. The catheter is ready for immediate use. Electronically Signed   By: Sandi Mariscal M.D.   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[place])  Overall, clinically, she appears to be improving She felt the abdominal bloating had resolved She is eating better although she has lost some weight She had expected side effects from treatment such as nausea, constipation, peripheral neuropathy and anemic Overall, I plan to continue same dose of treatment with aggressive supportive care I plan for 3 cycles of neoadjuvant chemotherapy followed by repeat staging scan, due in January 2019 I will coordinate appointments to get her seen by GYN oncologist again in January  Peripheral neuropathy due to chemotherapy [facility]) she has mild peripheral neuropathy, likely related to side effects of treatment. It is only mild, not bothering the patient. I will observe for now If it gets worse in the future, I will consider modifying the dose of the treatment   Anemia due to antineoplastic chemotherapy This is likely due to recent treatment. The patient denies recent history of bleeding such as epistaxis, hematuria or hematochezia. She is asymptomatic from the anemia. I will observe for now.  She does not  require transfusion now. I will continue the chemotherapy at current dose without dosage adjustment.  If the anemia gets progressive worse in the future, I might have to delay her treatment or adjust the chemotherapy dose.   Other constipation She had recent constipation secondary to side effects of antiemetic therapy We discussed laxative management   No orders of the defined types were placed in this encounter.  All questions were answered. The patient knows to call the clinic with any problems, questions or concerns. No barriers to learning was detected. I spent 15 minutes counseling the patient face to face. The total time spent in the appointment was 20 minutes and more than 50% was on counseling and review of test results     Heath Lark, MD 06/06/2017 10:09 AM

## 2017-06-06 NOTE — Assessment & Plan Note (Addendum)
Overall, clinically, she appears to be improving She felt the abdominal bloating had resolved She is eating better although she has lost some weight She had expected side effects from treatment such as nausea, constipation, peripheral neuropathy and anemic Overall, I plan to continue same dose of treatment with aggressive supportive care I plan for 3 cycles of neoadjuvant chemotherapy followed by repeat staging scan, due in January 2019 I will coordinate appointments to get her seen by GYN oncologist again in January

## 2017-06-06 NOTE — Patient Instructions (Signed)
[facility] Discharge Instructions for Patients Receiving Chemotherapy  Today you received the following chemotherapy agents Taxol and Herceptin  To help prevent nausea and vomiting after your treatment, we encourage you to take your nausea medication as directed   If you develop nausea and vomiting that is not controlled by your nausea medication, call the clinic.   BELOW ARE SYMPTOMS THAT SHOULD BE REPORTED IMMEDIATELY:  *FEVER GREATER THAN 100.5 F  *CHILLS WITH OR WITHOUT FEVER  NAUSEA AND VOMITING THAT IS NOT CONTROLLED WITH YOUR NAUSEA MEDICATION  *UNUSUAL SHORTNESS OF BREATH  *UNUSUAL BRUISING OR BLEEDING  TENDERNESS IN MOUTH AND THROAT WITH OR WITHOUT PRESENCE OF ULCERS  *URINARY PROBLEMS  *BOWEL PROBLEMS  UNUSUAL RASH Items with * indicate a potential emergency and should be followed up as soon as possible.  Feel free to call the clinic should you have any questions or concerns. The clinic phone number is [phone].  Please show the CHEMO ALERT CARD at check-in to the Emergency Department and triage nurse.   

## 2017-06-08 ENCOUNTER — Ambulatory Visit (HOSPITAL_BASED_OUTPATIENT_CLINIC_OR_DEPARTMENT_OTHER): Payer: Medicare Other

## 2017-06-08 VITALS — BP 136/72 | HR 74 | Temp 97.1°F | Resp 16

## 2017-06-08 DIAGNOSIS — C786 Secondary malignant neoplasm of retroperitoneum and peritoneum: Secondary | ICD-10-CM

## 2017-06-08 DIAGNOSIS — Z5189 Encounter for other specified aftercare: Secondary | ICD-10-CM

## 2017-06-08 DIAGNOSIS — C561 Malignant neoplasm of right ovary: Secondary | ICD-10-CM | POA: Diagnosis not present

## 2017-06-08 DIAGNOSIS — C801 Malignant (primary) neoplasm, unspecified: Secondary | ICD-10-CM

## 2017-06-08 MED ORDER — PEGFILGRASTIM INJECTION 6 MG/0.6ML ~~LOC~~
6.0000 mg | PREFILLED_SYRINGE | Freq: Once | SUBCUTANEOUS | Status: AC
  Administered 2017-06-08: 6 mg via SUBCUTANEOUS

## 2017-06-20 ENCOUNTER — Telehealth: Payer: Self-pay | Admitting: Gynecologic Oncology

## 2017-06-20 ENCOUNTER — Other Ambulatory Visit: Payer: Self-pay | Admitting: Hematology and Oncology

## 2017-06-20 DIAGNOSIS — C561 Malignant neoplasm of right ovary: Secondary | ICD-10-CM

## 2017-06-20 NOTE — Telephone Encounter (Signed)
Spoke with patient about upcoming appts.  Advised her that we have her scheduled to see Dr. Denman George on Jan 9 with potential surgery on Jan 22 here in [place].  Advised the patient that if she felt strongly about sticking with Dr. Skeet Latch since she saw her initially, she could always go to [facility] for an appt, pre-op, and surgery.  She states at this time she would like to remain in [place] with Dr. Denman George and she will call if anything changes.  Advised to call for any questions or concerns.

## 2017-06-25 ENCOUNTER — Encounter: Payer: Self-pay | Admitting: Hematology and Oncology

## 2017-06-25 ENCOUNTER — Other Ambulatory Visit (HOSPITAL_BASED_OUTPATIENT_CLINIC_OR_DEPARTMENT_OTHER): Payer: Medicare Other

## 2017-06-25 ENCOUNTER — Ambulatory Visit (HOSPITAL_BASED_OUTPATIENT_CLINIC_OR_DEPARTMENT_OTHER): Payer: Medicare Other | Admitting: Hematology and Oncology

## 2017-06-25 VITALS — BP 136/73 | HR 92 | Temp 98.6°F | Resp 16 | Ht 64.0 in | Wt 101.3 lb

## 2017-06-25 DIAGNOSIS — T451X5A Adverse effect of antineoplastic and immunosuppressive drugs, initial encounter: Secondary | ICD-10-CM

## 2017-06-25 DIAGNOSIS — C786 Secondary malignant neoplasm of retroperitoneum and peritoneum: Secondary | ICD-10-CM

## 2017-06-25 DIAGNOSIS — C561 Malignant neoplasm of right ovary: Secondary | ICD-10-CM

## 2017-06-25 DIAGNOSIS — C801 Malignant (primary) neoplasm, unspecified: Principal | ICD-10-CM

## 2017-06-25 DIAGNOSIS — G620 Drug-induced polyneuropathy: Secondary | ICD-10-CM | POA: Diagnosis not present

## 2017-06-25 DIAGNOSIS — D6481 Anemia due to antineoplastic chemotherapy: Secondary | ICD-10-CM

## 2017-06-25 LAB — CBC WITH DIFFERENTIAL/PLATELET
BASO%: 1.2 % (ref 0.0–2.0)
Basophils Absolute: 0.1 10*3/uL (ref 0.0–0.1)
EOS ABS: 0 10*3/uL (ref 0.0–0.5)
EOS%: 0.2 % (ref 0.0–7.0)
HEMATOCRIT: 30.9 % — AB (ref 34.8–46.6)
HEMOGLOBIN: 10.1 g/dL — AB (ref 11.6–15.9)
LYMPH%: 12.6 % — ABNORMAL LOW (ref 14.0–49.7)
MCH: 29.4 pg (ref 25.1–34.0)
MCHC: 32.7 g/dL (ref 31.5–36.0)
MCV: 90.1 fL (ref 79.5–101.0)
MONO#: 0.9 10*3/uL (ref 0.1–0.9)
MONO%: 8.2 % (ref 0.0–14.0)
NEUT%: 77.8 % — ABNORMAL HIGH (ref 38.4–76.8)
NEUTROS ABS: 8.9 10*3/uL — AB (ref 1.5–6.5)
PLATELETS: 275 10*3/uL (ref 145–400)
RBC: 3.43 10*6/uL — ABNORMAL LOW (ref 3.70–5.45)
RDW: 18.1 % — AB (ref 11.2–14.5)
WBC: 11.4 10*3/uL — AB (ref 3.9–10.3)
lymph#: 1.4 10*3/uL (ref 0.9–3.3)

## 2017-06-25 LAB — COMPREHENSIVE METABOLIC PANEL
ALT: 11 U/L (ref 0–55)
ANION GAP: 9 meq/L (ref 3–11)
AST: 17 U/L (ref 5–34)
Albumin: 3.7 g/dL (ref 3.5–5.0)
Alkaline Phosphatase: 88 U/L (ref 40–150)
BUN: 14.8 mg/dL (ref 7.0–26.0)
CALCIUM: 9.5 mg/dL (ref 8.4–10.4)
CHLORIDE: 103 meq/L (ref 98–109)
CO2: 29 mEq/L (ref 22–29)
Creatinine: 0.9 mg/dL (ref 0.6–1.1)
EGFR: >60 mL/min/{1.73_m2} (ref >60)
Glucose: 94 mg/dl (ref 70–140)
POTASSIUM: 4.6 meq/L (ref 3.5–5.1)
Sodium: 140 mEq/L (ref 136–145)
Total Bilirubin: 0.22 mg/dL (ref 0.20–1.20)
Total Protein: 7 g/dL (ref 6.4–8.3)

## 2017-06-25 NOTE — Assessment & Plan Note (Signed)
she has mild peripheral neuropathy, likely related to side effects of treatment. It is only mild, not bothering the patient. I will observe for now If it gets worse in the future, I will consider modifying the dose of the treatment  

## 2017-06-25 NOTE — Progress Notes (Signed)
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  She presented for evaluation of an ultrasound was performed which demonstrated a uterus measuring 6 x 5 cm with a 1.3 mm endometrial stripe.  Multiple heterogeneous pelvic masses were appreciated the right measured 8 x 5 cm and another measured 10 cm just posterior to the uterus CA-125 was obtained and returned a value of 404.8. She also reports early satiety for approximately 2 months and a 10 pound weight loss over the last year.  She also reports mild abdominal bloating and sensation of pelvic heaviness      04/25/2017 Imaging    Ct abdomen and pelvis: 1. Appearance compatible with ovarian malignancy with widespread peritoneal, mesenteric, and omental metastatic disease. Specifically, a 12.7 cm primarily solid and partially cystic mass above the uterus probably arises from the right ovary and is associated with omental caking, complex ascites with peritoneal metastatic deposits, and mesenteric deposits of tumor. 2. Mild intrahepatic biliary dilatation. The extrahepatic biliary tree is within normal limits. 3.  Aortic Atherosclerosis (ICD10-I70.0).      05/01/2017 Procedure    Successful CT-guided left abdominal omental mass 18 gauge core biopsies      05/01/2017 Pathology Results    Soft Tissue Needle Core Biopsy, Left omental - METASTATIC CARCINOMA, SEE COMMENT. 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 The catheter is ready for immediate use.      05/16/2017 -  Chemotherapy     She received neoadjuvant chemo with carbo/taxol       INTERVAL HISTORY: Please see below for problem oriented charting. She returns for further follow-up, to be seen prior to cycle 3 of chemotherapy She felt better Ascites has resolved She denies significant progression of peripheral neuropathy Denies significant nausea, vomiting or changes in bowel habits  REVIEW OF SYSTEMS:   Constitutional: Denies fevers, chills or abnormal weight loss Eyes: Denies blurriness of vision Ears, nose, mouth, throat, and face: Denies mucositis or sore throat Respiratory: Denies cough, dyspnea or wheezes Cardiovascular: Denies palpitation, chest discomfort or lower extremity swelling Gastrointestinal:  Denies nausea, heartburn or change in bowel habits Skin: Denies abnormal skin rashes Lymphatics: Denies new lymphadenopathy or easy bruising Neurological:Denies numbness, tingling or new weaknesses Behavioral/Psych: Mood is stable, no new changes  All other systems were reviewed with the patient and are negative.  I have reviewed the past medical history, past surgical history, social history and family history with the patient and they are unchanged from previous note.  ALLERGIES:  has No Known Allergies.  MEDICATIONS:  Current Outpatient Medications  Medication Sig Dispense Refill  . acetaminophen (TYLENOL) 500 MG tablet Take 500 mg by mouth every 6 (six) hours as needed for mild pain or moderate pain.     . Calcium Carbonate-Vitamin D (CALCIUM + D PO) Take 1 tablet by mouth daily.     Marland Kitchen dexamethasone (DECADRON) 4 MG tablet Take 5 tabs the evening before chemo and 5 tabs the morning of chemo, every 21 days by mouth with pills 30 tablet 1  . ibuprofen (ADVIL,MOTRIN) 200  MG tablet Take 200 mg by mouth every 6 (six) hours as needed for mild pain or moderate pain. Pain     . lidocaine-prilocaine (EMLA) cream Apply to affected area once 30 g 3  . ondansetron (ZOFRAN) 8 MG tablet Take 1 tablet (8 mg  total) by mouth every 8 (eight) hours as needed for refractory nausea / vomiting. Start on day 3 after chemo. 30 tablet 1  . oxyCODONE (OXY IR/ROXICODONE) 5 MG immediate release tablet Take 1 tablet (5 mg total) by mouth every 4 (four) hours as needed for severe pain. 60 tablet 0  . prochlorperazine (COMPAZINE) 10 MG tablet Take 1 tablet (10 mg total) every 6 (six) hours as needed by mouth (Nausea or vomiting). 30 tablet 1   No current facility-administered medications for this visit.     PHYSICAL EXAMINATION: ECOG PERFORMANCE STATUS: 1 - Symptomatic but completely ambulatory  Vitals:   06/25/17 1028  BP: 136/73  Pulse: 92  Resp: 16  Temp: 98.6 F (37 C)  SpO2: 100%   Filed Weights   06/25/17 1028  Weight: 101 lb 4.8 oz (45.9 kg)    GENERAL:alert, no distress and comfortable SKIN: skin color, texture, turgor are normal, no rashes or significant lesions EYES: normal, Conjunctiva are pink and non-injected, sclera clear OROPHARYNX:no exudate, no erythema and lips, buccal mucosa, and tongue normal  NECK: supple, thyroid normal size, non-tender, without nodularity LYMPH:  no palpable lymphadenopathy in the cervical, axillary or inguinal LUNGS: clear to auscultation and percussion with normal breathing effort HEART: regular rate & rhythm and no murmurs and no lower extremity edema ABDOMEN:abdomen soft, non-tender and normal bowel sounds Musculoskeletal:no cyanosis of digits and no clubbing  NEURO: alert & oriented x 3 with fluent speech, no focal motor/sensory deficits  LABORATORY DATA:  I have reviewed the data as listed    Component Value Date/Time   NA 140 06/25/2017 1003   K 4.6 06/25/2017 1003   CL 104 05/15/2017 1026   CO2 29 06/25/2017 1003   GLUCOSE 94 06/25/2017 1003   GLUCOSE 81 05/18/2006 1112   BUN 14.8 06/25/2017 1003   CREATININE 0.9 06/25/2017 1003   CALCIUM 9.5 06/25/2017 1003   PROT 7.0 06/25/2017 1003   ALBUMIN 3.7 06/25/2017 1003   AST 17 06/25/2017  1003   ALT 11 06/25/2017 1003   ALKPHOS 88 06/25/2017 1003   BILITOT 0.22 06/25/2017 1003   GFRNONAA 57 (L) 05/15/2017 1026   GFRAA >60 05/15/2017 1026    No results found for: SPEP, UPEP  Lab Results  Component Value Date   WBC 11.4 (H) 06/25/2017   NEUTROABS 8.9 (H) 06/25/2017   HGB 10.1 (L) 06/25/2017   HCT 30.9 (L) 06/25/2017   MCV 90.1 06/25/2017   PLT 275 06/25/2017      Chemistry      Component Value Date/Time   NA 140 06/25/2017 1003   K 4.6 06/25/2017 1003   CL 104 05/15/2017 1026   CO2 29 06/25/2017 1003   BUN 14.8 06/25/2017 1003   CREATININE 0.9 06/25/2017 1003      Component Value Date/Time   CALCIUM 9.5 06/25/2017 1003   ALKPHOS 88 06/25/2017 1003   AST 17 06/25/2017 1003   ALT 11 06/25/2017 1003   BILITOT 0.22 06/25/2017 1003       ASSESSMENT & PLAN:  Right ovarian epithelial cancer (HCC) Overall, clinically, she appears to be improving She felt the abdominal bloating had resolved She is eating better although she has lost  some weight She had expected side effects from treatment such as nausea, constipation, peripheral neuropathy and anemia Overall, I plan to continue same dose of treatment with aggressive supportive care I plan for 3 cycles of neoadjuvant chemotherapy followed by repeat staging scan, due in January 2019 I will coordinate appointments to get her seen by GYN oncologist again in January  Peripheral neuropathy due to chemotherapy [facility]) she has mild peripheral neuropathy, likely related to side effects of treatment. It is only mild, not bothering the patient. I will observe for now If it gets worse in the future, I will consider modifying the dose of the treatment   Anemia due to antineoplastic chemotherapy This is likely due to recent treatment. The patient denies recent history of bleeding such as epistaxis, hematuria or hematochezia. She is asymptomatic from the anemia. I will observe for now.  She does not require transfusion  now. I will continue the chemotherapy at current dose without dosage adjustment.  If the anemia gets progressive worse in the future, I might have to delay her treatment or adjust the chemotherapy dose.    Orders Placed This Encounter  Procedures  . CA 125    Standing Status:   Standing    Number of Occurrences:   9    Standing Expiration Date:   06/25/2018   All questions were answered. The patient knows to call the clinic with any problems, questions or concerns. No barriers to learning was detected. I spent 15 minutes counseling the patient face to face. The total time spent in the appointment was 20 minutes and more than 50% was on counseling and review of test results     Heath Lark, MD 06/25/2017 12:17 PM

## 2017-06-25 NOTE — Assessment & Plan Note (Signed)
Overall, clinically, she appears to be improving She felt the abdominal bloating had resolved She is eating better although she has lost some weight She had expected side effects from treatment such as nausea, constipation, peripheral neuropathy and anemia Overall, I plan to continue same dose of treatment with aggressive supportive care I plan for 3 cycles of neoadjuvant chemotherapy followed by repeat staging scan, due in January 2019 I will coordinate appointments to get her seen by GYN oncologist again in January

## 2017-06-25 NOTE — Assessment & Plan Note (Signed)

## 2017-06-27 ENCOUNTER — Other Ambulatory Visit: Payer: Medicare Other

## 2017-06-27 ENCOUNTER — Ambulatory Visit (HOSPITAL_BASED_OUTPATIENT_CLINIC_OR_DEPARTMENT_OTHER): Payer: Medicare Other

## 2017-06-27 VITALS — BP 130/78 | HR 89 | Temp 97.7°F | Resp 16 | Wt 100.5 lb

## 2017-06-27 DIAGNOSIS — C801 Malignant (primary) neoplasm, unspecified: Secondary | ICD-10-CM

## 2017-06-27 DIAGNOSIS — C561 Malignant neoplasm of right ovary: Secondary | ICD-10-CM

## 2017-06-27 DIAGNOSIS — Z5111 Encounter for antineoplastic chemotherapy: Secondary | ICD-10-CM

## 2017-06-27 DIAGNOSIS — C786 Secondary malignant neoplasm of retroperitoneum and peritoneum: Secondary | ICD-10-CM

## 2017-06-27 MED ORDER — HEPARIN SOD (PORK) LOCK FLUSH 100 UNIT/ML IV SOLN
500.0000 [IU] | Freq: Once | INTRAVENOUS | Status: AC | PRN
  Administered 2017-06-27: 500 [IU]
  Filled 2017-06-27: qty 5

## 2017-06-27 MED ORDER — FAMOTIDINE IN NACL 20-0.9 MG/50ML-% IV SOLN
20.0000 mg | Freq: Once | INTRAVENOUS | Status: AC
  Administered 2017-06-27: 20 mg via INTRAVENOUS

## 2017-06-27 MED ORDER — SODIUM CHLORIDE 0.9 % IV SOLN
378.6000 mg | Freq: Once | INTRAVENOUS | Status: AC
  Administered 2017-06-27: 380 mg via INTRAVENOUS
  Filled 2017-06-27: qty 38

## 2017-06-27 MED ORDER — DIPHENHYDRAMINE HCL 50 MG/ML IJ SOLN
INTRAMUSCULAR | Status: AC
  Filled 2017-06-27: qty 1

## 2017-06-27 MED ORDER — FAMOTIDINE IN NACL 20-0.9 MG/50ML-% IV SOLN
INTRAVENOUS | Status: AC
Start: 2017-06-27 — End: 2017-06-27
  Filled 2017-06-27: qty 50

## 2017-06-27 MED ORDER — PALONOSETRON HCL INJECTION 0.25 MG/5ML
0.2500 mg | Freq: Once | INTRAVENOUS | Status: AC
  Administered 2017-06-27: 0.25 mg via INTRAVENOUS

## 2017-06-27 MED ORDER — PALONOSETRON HCL INJECTION 0.25 MG/5ML
INTRAVENOUS | Status: AC
  Filled 2017-06-27: qty 5

## 2017-06-27 MED ORDER — SODIUM CHLORIDE 0.9 % IV SOLN
Freq: Once | INTRAVENOUS | Status: AC
  Administered 2017-06-27: 10:00:00 via INTRAVENOUS

## 2017-06-27 MED ORDER — DIPHENHYDRAMINE HCL 50 MG/ML IJ SOLN
50.0000 mg | Freq: Once | INTRAMUSCULAR | Status: AC
  Administered 2017-06-27: 50 mg via INTRAVENOUS

## 2017-06-27 MED ORDER — SODIUM CHLORIDE 0.9 % IV SOLN
175.0000 mg/m2 | Freq: Once | INTRAVENOUS | Status: AC
  Administered 2017-06-27: 258 mg via INTRAVENOUS
  Filled 2017-06-27: qty 43

## 2017-06-27 MED ORDER — SODIUM CHLORIDE 0.9 % IV SOLN
Freq: Once | INTRAVENOUS | Status: AC
  Administered 2017-06-27: 11:00:00 via INTRAVENOUS
  Filled 2017-06-27: qty 5

## 2017-06-27 MED ORDER — SODIUM CHLORIDE 0.9% FLUSH
10.0000 mL | INTRAVENOUS | Status: DC | PRN
  Administered 2017-06-27: 10 mL
  Filled 2017-06-27: qty 10

## 2017-06-27 NOTE — Patient Instructions (Signed)
   [facility] Discharge Instructions for Patients Receiving Chemotherapy  Today you received the following chemotherapy agents Taxol and Carboplatin   To help prevent nausea and vomiting after your treatment, we encourage you to take your nausea medication as directed.    If you develop nausea and vomiting that is not controlled by your nausea medication, call the clinic.   BELOW ARE SYMPTOMS THAT SHOULD BE REPORTED IMMEDIATELY:  *FEVER GREATER THAN 100.5 F  *CHILLS WITH OR WITHOUT FEVER  NAUSEA AND VOMITING THAT IS NOT CONTROLLED WITH YOUR NAUSEA MEDICATION  *UNUSUAL SHORTNESS OF BREATH  *UNUSUAL BRUISING OR BLEEDING  TENDERNESS IN MOUTH AND THROAT WITH OR WITHOUT PRESENCE OF ULCERS  *URINARY PROBLEMS  *BOWEL PROBLEMS  UNUSUAL RASH Items with * indicate a potential emergency and should be followed up as soon as possible.  Feel free to call the clinic should you have any questions or concerns. The clinic phone number is [phone].  Please show the CHEMO ALERT CARD at check-in to the Emergency Department and triage nurse.   

## 2017-06-28 LAB — CA 125: CANCER ANTIGEN (CA) 125: 64.5 U/mL — AB (ref 0.0–38.1)

## 2017-06-29 ENCOUNTER — Ambulatory Visit (HOSPITAL_BASED_OUTPATIENT_CLINIC_OR_DEPARTMENT_OTHER): Payer: Medicare Other

## 2017-06-29 VITALS — BP 137/73 | HR 74 | Temp 97.9°F | Resp 16

## 2017-06-29 DIAGNOSIS — C786 Secondary malignant neoplasm of retroperitoneum and peritoneum: Secondary | ICD-10-CM

## 2017-06-29 DIAGNOSIS — Z5189 Encounter for other specified aftercare: Secondary | ICD-10-CM | POA: Diagnosis not present

## 2017-06-29 DIAGNOSIS — C801 Malignant (primary) neoplasm, unspecified: Secondary | ICD-10-CM

## 2017-06-29 DIAGNOSIS — C561 Malignant neoplasm of right ovary: Secondary | ICD-10-CM | POA: Diagnosis not present

## 2017-06-29 MED ORDER — PEGFILGRASTIM INJECTION 6 MG/0.6ML ~~LOC~~
6.0000 mg | PREFILLED_SYRINGE | Freq: Once | SUBCUTANEOUS | Status: AC
  Administered 2017-06-29: 6 mg via SUBCUTANEOUS

## 2017-06-29 MED ORDER — PEGFILGRASTIM INJECTION 6 MG/0.6ML ~~LOC~~
PREFILLED_SYRINGE | SUBCUTANEOUS | Status: AC
  Filled 2017-06-29: qty 0.6

## 2017-07-03 DIAGNOSIS — A0471 Enterocolitis due to Clostridium difficile, recurrent: Secondary | ICD-10-CM

## 2017-07-03 HISTORY — DX: Enterocolitis due to Clostridium difficile, recurrent: A04.71

## 2017-07-09 ENCOUNTER — Ambulatory Visit (HOSPITAL_COMMUNITY)
Admission: RE | Admit: 2017-07-09 | Discharge: 2017-07-09 | Disposition: A | Discharge status: Home / Self Care | Payer: Medicare Other | Source: Ambulatory Visit | Attending: Hematology and Oncology | Admitting: Hematology and Oncology

## 2017-07-09 DIAGNOSIS — R1900 Intra-abdominal and pelvic swelling, mass and lump, unspecified site: Secondary | ICD-10-CM | POA: Diagnosis not present

## 2017-07-09 DIAGNOSIS — C561 Malignant neoplasm of right ovary: Secondary | ICD-10-CM | POA: Insufficient documentation

## 2017-07-09 MED ORDER — IOPAMIDOL (ISOVUE-300) INJECTION 61%
INTRAVENOUS | Status: DC
Start: 2017-07-09 — End: 2017-07-10
  Filled 2017-07-09: qty 100

## 2017-07-09 MED ORDER — IOPAMIDOL (ISOVUE-300) INJECTION 61%
100.0000 mL | Freq: Once | INTRAVENOUS | Status: AC | PRN
  Administered 2017-07-09: 75 mL via INTRAVENOUS

## 2017-07-11 ENCOUNTER — Encounter: Payer: Self-pay | Admitting: Gynecologic Oncology

## 2017-07-11 ENCOUNTER — Inpatient Hospital Stay: Payer: Medicare Other | Attending: Gynecologic Oncology | Admitting: Gynecologic Oncology

## 2017-07-11 ENCOUNTER — Telehealth: Payer: Self-pay | Admitting: *Deleted

## 2017-07-11 VITALS — BP 148/82 | HR 93 | Temp 98.5°F | Resp 20 | Wt 102.2 lb

## 2017-07-11 DIAGNOSIS — R188 Other ascites: Secondary | ICD-10-CM

## 2017-07-11 DIAGNOSIS — Z9221 Personal history of antineoplastic chemotherapy: Secondary | ICD-10-CM

## 2017-07-11 DIAGNOSIS — C569 Malignant neoplasm of unspecified ovary: Secondary | ICD-10-CM

## 2017-07-11 DIAGNOSIS — Z90710 Acquired absence of both cervix and uterus: Secondary | ICD-10-CM | POA: Diagnosis not present

## 2017-07-11 DIAGNOSIS — Z90722 Acquired absence of ovaries, bilateral: Secondary | ICD-10-CM

## 2017-07-11 DIAGNOSIS — Z79899 Other long term (current) drug therapy: Secondary | ICD-10-CM

## 2017-07-11 DIAGNOSIS — C786 Secondary malignant neoplasm of retroperitoneum and peritoneum: Secondary | ICD-10-CM

## 2017-07-11 NOTE — Telephone Encounter (Signed)
-----   Message from Heath Lark, MD sent at 07/11/2017  3:44 PM EST ----- Thanks, Theodis Shove, tell patient I will cancel her chemo appt and wait to see her back after surgery ----- Message ----- From: Dorothyann Gibbs, NP Sent: 07/11/2017   3:24 PM To: Heath Lark, MD  Patient is set up for surgery on Jan 22 with Dr. Denman George.  Thank ya! Melissa

## 2017-07-11 NOTE — Telephone Encounter (Signed)
Notified of message below

## 2017-07-11 NOTE — Progress Notes (Signed)
Consult Note: Gyn-Onc  Consult was requested by Dr.Fogleman for the evaluation of Dejia D Kassis 72 y.o. female  CC:  Chief Complaint  Patient presents with  . Malignant neoplasm of ovary, unspecified laterality [facility])    Assessment/Plan:  Ms. OMARIA PLUNK is a 72 y.o. with evidence stage IIIC ovarian cancer s/p neoadjuvant chemotherapy with good response after 3 cycles. I believe she is a good candidate for interval cytoreduction surgery with hysterectomy, BSO, omentectomy, radical tumor debulking. Given her exam and CT findings, I will scheduel this through a minimally invasive approach. If at the time of surgery, adequate debulking is not felt to be possible through minimally invasive approach we will convert to laparotomy.  I discussed this and the potential different recovery times for the various surgical approaches.  I discussed that there is possibility that she will require colonic resection with reanastomosis and the potential risks involved with this including leak, stricture, fistula, reoperation, delay of subsequent chemotherapy.  I discussed the potential risk for colostomy formation.  I discussed risk for infection, damage to internal organs, VT E risk, death.  We will provide the patient with perioperative Lovenox including postoperative Lovenox for 28 days.  We will provide her with preoperative bowel prep in case of the need for colon resection.  She also received IV antibiotic prophylaxis.  HPI: Ms. ELLIEMAE BRAMAN is a 72 y.o. gravida 5 para 4 who reports a gradual sensation of pelvic heaviness which she thought might be related to genital prolapse.  She presented for evaluation of an ultrasound was performed which demonstrated a uterus measuring 6 x 5 cm with a 1.3 mm endometrial stripe.  Multiple heterogeneous pelvic masses were appreciated the right measured 8 x 5 cm and another measured 10 cm just posterior to the uterus CA-125 was obtained and returned a value of 404.8.   CT scan was obtained on April 25, 2017 and is notable for a capsular mass along the dome of the right hepatic lobe measuring 2.5 x 2.3 x 1.4 cm.  There are several tiny hypodense lesion lateral segment of the left hepatic lobe probably benign cysts.  The right kidney is surgically absent dorsal to the uterus there is a 12.7 x 7.1 x 7.3 cm primarily solid mass with cystic elements the mass is intimately associated with the uterus but also abuts the rectosigmoid extensive and omental caking was appreciated most notably in the left abdomen measuring up to 2.9 cm in thickness there was scattered tumor in the mesentery in the right colon mesentery was noted to have a 2.9 x 2.2 cm mesenteric mass.  The appearance is compatible with widespread peritoneal and omental metastatic disease.  Ms. gentle reports early satiety for approximately 2 months and a 10 pound weight loss over the last year.  She also reports mild abdominal bloating and sensation of pelvic heaviness.  Her family history is notable for mother diagnosed with breast cancer at the age of 19 and maternal grandfather with colon cancer and a father who was diagnosed with bladder cancer in his 4s and prostate cancer in the 63s.  She underwent a colonoscopy approximately 3 years ago and is up-to-date on mammography.  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  On May 16, 2017 until  June 25, 2017 she received 3 cycles of neoadjuvant carboplatin paclitaxel without issue.  Overall she feels well.  Her ascites has dramatically reduced.  She can eat well and denies abdominal pain.  She reports that her bowels are moving well and denies constipation.  CT scan of the abdomen and pelvis performed on 07/09/2017  revealed mild ascites decreased since prior study.  Previously seen left omental soft tissue mass has nearly completely resolved now measuring 3.8 x 0.8 cm.  Previously seen right lower quadrant peritoneal mass is no longer visualized.  Complex cystic and solid mass in the central pelvis is decreased in size since the previous study currently measuring 9.4 x 8.3 cm.  The previously seen peritoneal tumor implant along the dome of the right hepatic lobe has nearly completely resolved since the previous study.  There is no lymphadenopathy.  Ca1 25 drawn on day 1 of cycle 3 of her chemotherapy has substantially reduced to 64.5.  Review of Systems:  Constitutional  Feels well, 10 pound weight loss in the last year Cardiovascular  No chest pain, shortness of breath, or edema  Pulmonary  No cough or wheeze.  Gastro Intestinal  No nausea, vomitting, or diarrhoea. No bright red blood per rectum, no abdominal pain, ports an episode of diarrhea last week change in bowel movement, or constipation or early satiety.  Genito Urinary  No frequency, urgency, dysuria, denies abnormal uterine bleeding, reports pelvic pressure Musculo Skeletal  No myalgia, arthralgia, joint swelling reports twinges of the anterior abdominal wall Neurologic  No weakness, numbness, change in gait,  Psychology  No depression, anxiety, insomnia.    Current Meds:  Outpatient Encounter Medications as of 07/11/2017  Medication Sig  . acetaminophen (TYLENOL) 500 MG tablet Take 500 mg by mouth every 6 (six) hours as needed for mild pain or moderate pain.   . Calcium Carbonate-Vitamin D (CALCIUM + D PO) Take 1 tablet by mouth daily.   Marland Kitchen dexamethasone (DECADRON) 4 MG tablet Take 5 tabs the evening before chemo and 5 tabs the morning of chemo, every 21 days by mouth with pills  . ibuprofen (ADVIL,MOTRIN) 200 MG tablet Take 200 mg by mouth every 6 (six) hours as needed for mild pain or moderate pain. Pain   . lidocaine-prilocaine  (EMLA) cream Apply to affected area once  . ondansetron (ZOFRAN) 8 MG tablet Take 1 tablet (8 mg total) by mouth every 8 (eight) hours as needed for refractory nausea / vomiting. Start on day 3 after chemo.  Marland Kitchen oxyCODONE (OXY IR/ROXICODONE) 5 MG immediate release tablet Take 1 tablet (5 mg total) by mouth every 4 (four) hours as needed for severe pain.  Marland Kitchen prochlorperazine (COMPAZINE) 10 MG tablet Take 1 tablet (10 mg total) every 6 (six) hours as needed by mouth (Nausea or vomiting).   No facility-administered encounter medications on file as of 07/11/2017.     Allergy: No Known Allergies  Social Hx:   Social History   Socioeconomic History  . Marital status: Divorced    Spouse name: Not on file  . Number of children: Not on file  . Years of education: Not on file  . Highest education level: Not on file  Social Needs  . Financial resource strain: Not on file  . Food insecurity - worry: Not on file  . Food insecurity - inability: Not on file  . Transportation needs - medical: Not on file  . Transportation needs - non-medical: Not on file  Occupational History  .  Occupation: retired-- Manufacturing engineer  Tobacco Use  . Smoking status: Never Smoker  . Smokeless tobacco: Never Used  Substance and Sexual Activity  . Alcohol use: Yes    Alcohol/week: 0.6 oz    Types: 1 Cans of beer per week    Comment: no alcohol comsumption in past month   . Drug use: No  . Sexual activity: Not Currently    Partners: Male  Other Topics Concern  . Not on file  Social History Narrative  . Not on file    Past Surgical Hx:  Past Surgical History:  Procedure Laterality Date  . BREAST SURGERY Right    lumpectomy-benign  . CERVIX SURGERY     "freezing" dysplasia  . COLONOSCOPY    . HYSTEROSCOPY    . IR FLUORO GUIDE PORT INSERTION RIGHT  05/15/2017  . IR US GUIDE VASC ACCESS RIGHT  05/15/2017  . NEPHRECTOMY  01/1998   right--donation  . SHOULDER ARTHROSCOPY Left    "impingement" "frozen  shoulder"  . SHOULDER ARTHROSCOPY WITH SUBACROMIAL DECOMPRESSION Right 10/23/2013   Procedure: RIGHT SHOULDER ARTHROSCOPY WITH SUBACROMIAL DECOMPRESSION, EVULATION UNDER ANESTHESIA  AND MANIPULATION UNDER ANESTHESIA;  Surgeon: Johnn Hai, MD;  Location: [facility];  Service: Orthopedics;  Laterality: Right;    Past Medical Hx:  Past Medical History:  Diagnosis Date  . Anxiety    claustrophobia, fear of heights  . Complication of anesthesia    remembers severe sore throat after kidney surgery and feeling dizzy  . 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 Left shoulder pain   . Skin lesion    "pre-cancer" -past hx.    Past Gynecological History: Gravida 5 para 4 menopause occurred 16 years ago after hysteroscopy for abnormal bleeding remote history of abnormal Pap tests treated with cryotherapy  Family Hx:  Family History  Problem Relation Age of Onset  . Heart disease Mother        mitral valve prolapse  . Hypertension Mother   . Hyperlipidemia Mother   . Cancer Mother 2       breast  . Osteopenia Mother   . Heart disease Father        bypass  . Hypertension Father   . Hyperlipidemia Father   . Cancer Father 57       bladder,  prostate  . Hyperlipidemia Sister   . Colon cancer Maternal Grandfather     Vitals:  Blood pressure (!) 148/82, pulse 93, temperature 98.5 F (36.9 C), temperature source Oral, resp. rate 20, weight 102 lb 3.2 oz (46.4 kg), SpO2 100 %.  Physical Exam: WD in NAD Neck  Supple NROM, without any enlargements.  Lymph Node Survey No cervical supraclavicular or inguinal adenopathy Cardiovascular  Pulse normal rate, regularity and rhythm. S1 and S2 normal.  Lungs  Clear to auscultation bilaterally, without wheezes/crackles/rhonchi. Good air movement.  Skin  No rash/lesions/breakdown  Psychiatry  Alert and oriented appropriate mood affect speech and reasoning. Abdomen  Normoactive bowel sounds, abdomen soft, non-tender.  No masses.  Omental cake no longer palpable. Back No CVA tenderness Genito Urinary  Vulva/vagina: Normal external female genitalia.  No lesions. No discharge or bleeding.  Bladder/urethra:  No lesions or masses  Vagina: Atrophic  Cervix: Normal appearing, displaced anteriorly   Uterus: Fixed to the pelvic mass no parametrial involvement or nodularity. Mobile with mass.  Adnexa: 10 cm midline fixed cul-de-sac mass Rectal  Good tone, no masses pelvic mass appreciated no intraluminal masses  Extremities  No bilateral cyanosis, clubbing or edema.   [name], MD, PhD 07/11/2017, 4:50 PM

## 2017-07-11 NOTE — H&P (View-Only) (Signed)
Consult Note: Gyn-Onc  Consult was requested by Dr.Fogleman for the evaluation of Colleen Mckinney 72 y.o. female  CC:  Chief Complaint  Patient presents with  . Malignant neoplasm of ovary, unspecified laterality [facility])    Assessment/Plan:  Colleen Mckinney is a 72 y.o. with evidence stage IIIC ovarian cancer s/p neoadjuvant chemotherapy with good response after 3 cycles. I believe she is a good candidate for interval cytoreduction surgery with hysterectomy, BSO, omentectomy, radical tumor debulking. Given her exam and CT findings, I will scheduel this through a minimally invasive approach. If at the time of surgery, adequate debulking is not felt to be possible through minimally invasive approach we will convert to laparotomy.  I discussed this and the potential different recovery times for the various surgical approaches.  I discussed that there is possibility that she will require colonic resection with reanastomosis and the potential risks involved with this including leak, stricture, fistula, reoperation, delay of subsequent chemotherapy.  I discussed the potential risk for colostomy formation.  I discussed risk for infection, damage to internal organs, VT E risk, death.  We will provide the patient with perioperative Lovenox including postoperative Lovenox for 28 days.  We will provide her with preoperative bowel prep in case of the need for colon resection.  She also received IV antibiotic prophylaxis.  HPI: Colleen Mckinney is a 72 y.o. gravida 5 para 4 who reports a gradual sensation of pelvic heaviness which she thought might be related to genital prolapse.  She presented for evaluation of an ultrasound was performed which demonstrated a uterus measuring 6 x 5 cm with a 1.3 mm endometrial stripe.  Multiple heterogeneous pelvic masses were appreciated the right measured 8 x 5 cm and another measured 10 cm just posterior to the uterus CA-125 was obtained and returned a value of 404.8.   CT scan was obtained on April 25, 2017 and is notable for a capsular mass along the dome of the right hepatic lobe measuring 2.5 x 2.3 x 1.4 cm.  There are several tiny hypodense lesion lateral segment of the left hepatic lobe probably benign cysts.  The right kidney is surgically absent dorsal to the uterus there is a 12.7 x 7.1 x 7.3 cm primarily solid mass with cystic elements the mass is intimately associated with the uterus but also abuts the rectosigmoid extensive and omental caking was appreciated most notably in the left abdomen measuring up to 2.9 cm in thickness there was scattered tumor in the mesentery in the right colon mesentery was noted to have a 2.9 x 2.2 cm mesenteric mass.  The appearance is compatible with widespread peritoneal and omental metastatic disease.  Colleen Mckinney reports early satiety for approximately 2 months and a 10 pound weight loss over the last year.  She also reports mild abdominal bloating and sensation of pelvic heaviness.  Her family history is notable for mother diagnosed with breast cancer at the age of 54 and maternal grandfather with colon cancer and a father who was diagnosed with bladder cancer in his 48s and prostate cancer in the 38s.  She underwent a colonoscopy approximately 3 years ago and is up-to-date on mammography.  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  On May 16, 2017 until  June 25, 2017 she received 3 cycles of neoadjuvant carboplatin paclitaxel without issue.  Overall she feels well.  Her ascites has dramatically reduced.  She can eat well and denies abdominal pain.  She reports that her bowels are moving well and denies constipation.  CT scan of the abdomen and pelvis performed on 07/09/2017  revealed mild ascites decreased since prior study.  Previously seen left omental soft tissue mass has nearly completely resolved now measuring 3.8 x 0.8 cm.  Previously seen right lower quadrant peritoneal mass is no longer visualized.  Complex cystic and solid mass in the central pelvis is decreased in size since the previous study currently measuring 9.4 x 8.3 cm.  The previously seen peritoneal tumor implant along the dome of the right hepatic lobe has nearly completely resolved since the previous study.  There is no lymphadenopathy.  Ca1 25 drawn on day 1 of cycle 3 of her chemotherapy has substantially reduced to 64.5.  Review of Systems:  Constitutional  Feels well, 10 pound weight loss in the last year Cardiovascular  No chest pain, shortness of breath, or edema  Pulmonary  No cough or wheeze.  Gastro Intestinal  No nausea, vomitting, or diarrhoea. No bright red blood per rectum, no abdominal pain, ports an episode of diarrhea last week change in bowel movement, or constipation or early satiety.  Genito Urinary  No frequency, urgency, dysuria, denies abnormal uterine bleeding, reports pelvic pressure Musculo Skeletal  No myalgia, arthralgia, joint swelling reports twinges of the anterior abdominal wall Neurologic  No weakness, numbness, change in gait,  Psychology  No depression, anxiety, insomnia.    Current Meds:  Outpatient Encounter Medications as of 07/11/2017  Medication Sig  . acetaminophen (TYLENOL) 500 MG tablet Take 500 mg by mouth every 6 (six) hours as needed for mild pain or moderate pain.   . Calcium Carbonate-Vitamin D (CALCIUM + D PO) Take 1 tablet by mouth daily.   Marland Kitchen dexamethasone (DECADRON) 4 MG tablet Take 5 tabs the evening before chemo and 5 tabs the morning of chemo, every 21 days by mouth with pills  . ibuprofen (ADVIL,MOTRIN) 200 MG tablet Take 200 mg by mouth every 6 (six) hours as needed for mild pain or moderate pain. Pain   . lidocaine-prilocaine  (EMLA) cream Apply to affected area once  . ondansetron (ZOFRAN) 8 MG tablet Take 1 tablet (8 mg total) by mouth every 8 (eight) hours as needed for refractory nausea / vomiting. Start on day 3 after chemo.  Marland Kitchen oxyCODONE (OXY IR/ROXICODONE) 5 MG immediate release tablet Take 1 tablet (5 mg total) by mouth every 4 (four) hours as needed for severe pain.  Marland Kitchen prochlorperazine (COMPAZINE) 10 MG tablet Take 1 tablet (10 mg total) every 6 (six) hours as needed by mouth (Nausea or vomiting).   No facility-administered encounter medications on file as of 07/11/2017.     Allergy: No Known Allergies  Social Hx:   Social History   Socioeconomic History  . Marital status: Divorced    Spouse name: Not on file  . Number of children: Not on file  . Years of education: Not on file  . Highest education level: Not on file  Social Needs  . Financial resource strain: Not on file  . Food insecurity - worry: Not on file  . Food insecurity - inability: Not on file  . Transportation needs - medical: Not on file  . Transportation needs - non-medical: Not on file  Occupational History  .  Occupation: retired-- Manufacturing engineer  Tobacco Use  . Smoking status: Never Smoker  . Smokeless tobacco: Never Used  Substance and Sexual Activity  . Alcohol use: Yes    Alcohol/week: 0.6 oz    Types: 1 Cans of beer per week    Comment: no alcohol comsumption in past month   . Drug use: No  . Sexual activity: Not Currently    Partners: Male  Other Topics Concern  . Not on file  Social History Narrative  . Not on file    Past Surgical Hx:  Past Surgical History:  Procedure Laterality Date  . BREAST SURGERY Right    lumpectomy-benign  . CERVIX SURGERY     "freezing" dysplasia  . COLONOSCOPY    . HYSTEROSCOPY    . IR FLUORO GUIDE PORT INSERTION RIGHT  05/15/2017  . IR US GUIDE VASC ACCESS RIGHT  05/15/2017  . NEPHRECTOMY  01/1998   right--donation  . SHOULDER ARTHROSCOPY Left    "impingement" "frozen  shoulder"  . SHOULDER ARTHROSCOPY WITH SUBACROMIAL DECOMPRESSION Right 10/23/2013   Procedure: RIGHT SHOULDER ARTHROSCOPY WITH SUBACROMIAL DECOMPRESSION, EVULATION UNDER ANESTHESIA  AND MANIPULATION UNDER ANESTHESIA;  Surgeon: Johnn Hai, MD;  Location: [facility];  Service: Orthopedics;  Laterality: Right;    Past Medical Hx:  Past Medical History:  Diagnosis Date  . Anxiety    claustrophobia, fear of heights  . Complication of anesthesia    remembers severe sore throat after kidney surgery and feeling dizzy  . 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 Left shoulder pain   . Skin lesion    "pre-cancer" -past hx.    Past Gynecological History: Gravida 5 para 4 menopause occurred 16 years ago after hysteroscopy for abnormal bleeding remote history of abnormal Pap tests treated with cryotherapy  Family Hx:  Family History  Problem Relation Age of Onset  . Heart disease Mother        mitral valve prolapse  . Hypertension Mother   . Hyperlipidemia Mother   . Cancer Mother 63       breast  . Osteopenia Mother   . Heart disease Father        bypass  . Hypertension Father   . Hyperlipidemia Father   . Cancer Father 4       bladder,  prostate  . Hyperlipidemia Sister   . Colon cancer Maternal Grandfather     Vitals:  Blood pressure (!) 148/82, pulse 93, temperature 98.5 F (36.9 C), temperature source Oral, resp. rate 20, weight 102 lb 3.2 oz (46.4 kg), SpO2 100 %.  Physical Exam: WD in NAD Neck  Supple NROM, without any enlargements.  Lymph Node Survey No cervical supraclavicular or inguinal adenopathy Cardiovascular  Pulse normal rate, regularity and rhythm. S1 and S2 normal.  Lungs  Clear to auscultation bilaterally, without wheezes/crackles/rhonchi. Good air movement.  Skin  No rash/lesions/breakdown  Psychiatry  Alert and oriented appropriate mood affect speech and reasoning. Abdomen  Normoactive bowel sounds, abdomen soft, non-tender.  No masses.  Omental cake no longer palpable. Back No CVA tenderness Genito Urinary  Vulva/vagina: Normal external female genitalia.  No lesions. No discharge or bleeding.  Bladder/urethra:  No lesions or masses  Vagina: Atrophic  Cervix: Normal appearing, displaced anteriorly   Uterus: Fixed to the pelvic mass no parametrial involvement or nodularity. Mobile with mass.  Adnexa: 10 cm midline fixed cul-de-sac mass Rectal  Good tone, no masses pelvic mass appreciated no intraluminal masses  Extremities  No bilateral cyanosis, clubbing or edema.   [name], MD, PhD 07/11/2017, 4:50 PM

## 2017-07-11 NOTE — Patient Instructions (Signed)
Preparing for your Surgery  Plan for surgery on July 24, 2017 with Dr. Everitt Amber at [place] will be scheduled for a robotic assisted total hysterectomy, bilateral salpingo-oophorectomy, omentectomy, radical tumor debulking, possible laparotomy.  Pre-operative Testing -You will receive a phone call from presurgical testing at [facility] to arrange for a pre-operative testing appointment before your surgery.  This appointment normally occurs one to two weeks before your scheduled surgery.   -Bring your insurance card, copy of an advanced directive if applicable, medication list  -At that visit, you will be asked to sign a consent for a possible blood transfusion in case a transfusion becomes necessary during surgery.  The need for a blood transfusion is rare but having consent is a necessary part of your care.     -You should not be taking blood thinners or aspirin at least ten days prior to surgery unless instructed by your surgeon.  Day Before Surgery at [place] will be asked to take in a light diet the day before surgery.  Avoid carbonated beverages.  You will be advised to have nothing to eat or drink after midnight the evening before.    Eat a light diet the day before surgery.  Examples including soups, broths, toast, yogurt, mashed potatoes.  Things to avoid include carbonated beverages (fizzy beverages), raw fruits and raw vegetables, or beans.   If your bowels are filled with gas, your surgeon will have difficulty visualizing your pelvic organs which increases your surgical risks.  STARTING AT 4PM THE DAY BEFORE SURGERY, begin drinking two bottles of magnesium citrate and only take in clear liquids after that time.  Your role in recovery Your role is to become active as soon as directed by your doctor, while still giving yourself time to heal.  Rest when you feel tired. You will be asked to do the following in order to speed your  recovery:  - Cough and breathe deeply. This helps toclear and expand your lungs and can prevent pneumonia. You may be given a spirometer to practice deep breathing. A staff member will show you how to use the spirometer. - Do mild physical activity. Walking or moving your legs help your circulation and body functions return to normal. A staff member will help you when you try to walk and will provide you with simple exercises. Do not try to get up or walk alone the first time. - Actively manage your pain. Managing your pain lets you move in comfort. We will ask you to rate your pain on a scale of zero to 10. It is your responsibility to tell your doctor or nurse where and how much you hurt so your pain can be treated.  Special Considerations -If you are diabetic, you may be placed on insulin after surgery to have closer control over your blood sugars to promote healing and recovery.  This does not mean that you will be discharged on insulin.  If applicable, your oral antidiabetics will be resumed when you are tolerating a solid diet.  -Your final pathology results from surgery should be available by the Friday after surgery and the results will be relayed to you when available.  -Dr. Lahoma Crocker is the Surgeon that assists your GYN Oncologist with surgery.  The next day after your surgery you will either see your GYN Oncologist or Dr. Lahoma Crocker.   Blood Transfusion Information WHAT IS A BLOOD TRANSFUSION? A transfusion is the replacement of blood or  some of its parts. Blood is made up of multiple cells which provide different functions.  Red blood cells carry oxygen and are used for blood loss replacement.  White blood cells fight against infection.  Platelets control bleeding.  Plasma helps clot blood.  Other blood products are available for specialized needs, such as hemophilia or other clotting disorders. BEFORE THE TRANSFUSION  Who gives blood for transfusions?   You  may be able to donate blood to be used at a later date on yourself (autologous donation).  Relatives can be asked to donate blood. This is generally not any safer than if you have received blood from a stranger. The same precautions are taken to ensure safety when a relative's blood is donated.  Healthy volunteers who are fully evaluated to make sure their blood is safe. This is blood bank blood. Transfusion therapy is the safest it has ever been in the practice of medicine. Before blood is taken from a donor, a complete history is taken to make sure that person has no history of diseases nor engages in risky social behavior (examples are intravenous drug use or sexual activity with multiple partners). The donor's travel history is screened to minimize risk of transmitting infections, such as malaria. The donated blood is tested for signs of infectious diseases, such as HIV and hepatitis. The blood is then tested to be sure it is compatible with you in order to minimize the chance of a transfusion reaction. If you or a relative donates blood, this is often done in anticipation of surgery and is not appropriate for emergency situations. It takes many days to process the donated blood. RISKS AND COMPLICATIONS Although transfusion therapy is very safe and saves many lives, the main dangers of transfusion include:   Getting an infectious disease.  Developing a transfusion reaction. This is an allergic reaction to something in the blood you were given. Every precaution is taken to prevent this. The decision to have a blood transfusion has been considered carefully by your caregiver before blood is given. Blood is not given unless the benefits outweigh the risks.

## 2017-07-18 ENCOUNTER — Ambulatory Visit: Payer: Medicare Other

## 2017-07-18 ENCOUNTER — Ambulatory Visit: Payer: Medicare Other | Admitting: Hematology and Oncology

## 2017-07-18 ENCOUNTER — Other Ambulatory Visit: Payer: Medicare Other

## 2017-07-18 NOTE — Patient Instructions (Addendum)
Colleen Mckinney  07/18/2017   Your procedure is scheduled on: 07-24-17  Report to [facility] Main  Entrance    Report to admitting at 11:30AM   Call this number if you have problems the morning of surgery [phone]     Remember: Eat a light diet the day before surgery. Examples including soups, broths, toast, yogurt, mashed potatoes. Things to avoid include carbonated beverages (fizzy beverages), raw fruits and raw vegetables, or beans.    Starting at 4pm the day before surgery, begin drinking two bottles of magnesium citrate and only take in clear liquids after that time.    You may continue drinking clear liquids until 8:00AM day of surgery. Nothing by mouth after 8:00AM!     Take these medicines the morning of surgery with A SIP OF WATER: NONE                                You may not have any metal on your body including hair pins and              piercings  Do not wear jewelry, make-up, lotions, powders or perfumes, deodorant             Do not wear nail polish.  Do not shave  48 hours prior to surgery.                Do not bring valuables to the hospital. [place].  Contacts, dentures or bridgework may not be worn into surgery.  Leave suitcase in the car. After surgery it may be brought to your room.                Please read over the following fact sheets you were given: _____________________________________________________________________    CLEAR LIQUID DIET   Foods Allowed                                                                     Foods Excluded  Coffee and tea, regular and decaf                             liquids that you cannot  Plain Jell-O in any flavor                                             see through such as: Fruit ices (not with fruit pulp)                                     milk, soups, orange juice  Iced Popsicles  All  solid food Carbonated beverages, regular and diet                                    Cranberry, grape and apple juices Sports drinks like Gatorade Lightly seasoned clear broth or consume(fat free) Sugar, honey syrup  Sample Menu Breakfast                                Lunch                                     Supper Cranberry juice                    Beef broth                            Chicken broth Jell-O                                     Grape juice                           Apple juice Coffee or tea                        Jell-O                                      Popsicle                                                Coffee or tea                        Coffee or tea  _____________________________________________________________________  [facility] - Preparing for Surgery Before surgery, you can play an important role.  Because skin is not sterile, your skin needs to be as free of germs as possible.  You can reduce the number of germs on your skin by washing with CHG (chlorahexidine gluconate) soap before surgery.  CHG is an antiseptic cleaner which kills germs and bonds with the skin to continue killing germs even after washing. Please DO NOT use if you have an allergy to CHG or antibacterial soaps.  If your skin becomes reddened/irritated stop using the CHG and inform your nurse when you arrive at Short Stay. Do not shave (including legs and underarms) for at least 48 hours prior to the first CHG shower.  You may shave your face/neck. Please follow these instructions carefully:  1.  Shower with CHG Soap the night before surgery and the  morning of Surgery.  2.  If you choose to wash your hair, wash your hair first as usual with your  normal  shampoo.  3.  After you shampoo, rinse your hair and body thoroughly to remove the  shampoo.  4.  Use CHG as you would any other liquid soap.  You can apply chg directly  to the skin and wash                        Gently with a scrungie or clean washcloth.  5.  Apply the CHG Soap to your body ONLY FROM THE NECK DOWN.   Do not use on face/ open                           Wound or open sores. Avoid contact with eyes, ears mouth and genitals (private parts).                       Wash face,  Genitals (private parts) with your normal soap.             6.  Wash thoroughly, paying special attention to the area where your surgery  will be performed.  7.  Thoroughly rinse your body with warm water from the neck down.  8.  DO NOT shower/wash with your normal soap after using and rinsing off  the CHG Soap.                9.  Pat yourself dry with a clean towel.            10.  Wear clean pajamas.            11.  Place clean sheets on your bed the night of your first shower and do not  sleep with pets. Day of Surgery : Do not apply any lotions/deodorants the morning of surgery.  Please wear clean clothes to the hospital/surgery center.  FAILURE TO FOLLOW THESE INSTRUCTIONS MAY RESULT IN THE CANCELLATION OF YOUR SURGERY PATIENT SIGNATURE_________________________________  NURSE SIGNATURE__________________________________  ________________________________________________________________________   Colleen Mckinney  An incentive spirometer is a tool that can help keep your lungs clear and active. This tool measures how well you are filling your lungs with each breath. Taking long deep breaths may help reverse or decrease the chance of developing breathing (pulmonary) problems (especially infection) following:  A long period of time when you are unable to move or be active. BEFORE THE PROCEDURE   If the spirometer includes an indicator to show your best effort, your nurse or respiratory therapist will set it to a desired goal.  If possible, sit up straight or lean slightly forward. Try not to slouch.  Hold the incentive spirometer in an upright position. INSTRUCTIONS FOR USE  1. Sit on the edge of your bed  if possible, or sit up as far as you can in bed or on a chair. 2. Hold the incentive spirometer in an upright position. 3. Breathe out normally. 4. Place the mouthpiece in your mouth and seal your lips tightly around it. 5. Breathe in slowly and as deeply as possible, raising the piston or the ball toward the top of the column. 6. Hold your breath for 3-5 seconds or for as long as possible. Allow the piston or ball to fall to the bottom of the column. 7. Remove the mouthpiece from your mouth and breathe out normally. 8. Rest for a few seconds and repeat Steps 1 through 7 at least 10 times every 1-2 hours when you are awake. Take your time and take a few normal breaths between deep breaths. 9. The spirometer may include an indicator to  show your best effort. Use the indicator as a goal to work toward during each repetition. 10. After each set of 10 deep breaths, practice coughing to be sure your lungs are clear. If you have an incision (the cut made at the time of surgery), support your incision when coughing by placing a pillow or rolled up towels firmly against it. Once you are able to get out of bed, walk around indoors and cough well. You may stop using the incentive spirometer when instructed by your caregiver.  RISKS AND COMPLICATIONS  Take your time so you do not get dizzy or light-headed.  If you are in pain, you may need to take or ask for pain medication before doing incentive spirometry. It is harder to take a deep breath if you are having pain. AFTER USE  Rest and breathe slowly and easily.  It can be helpful to keep track of a log of your progress. Your caregiver can provide you with a simple table to help with this. If you are using the spirometer at home, follow these instructions: [place] IF:   You are having difficultly using the spirometer.  You have trouble using the spirometer as often as instructed.  Your pain medication is not giving enough relief while  using the spirometer.  You develop fever of 100.5 F (38.1 C) or higher. SEEK IMMEDIATE MEDICAL CARE IF:   You cough up bloody sputum that had not been present before.  You develop fever of 102 F (38.9 C) or greater.  You develop worsening pain at or near the incision site. MAKE SURE YOU:   Understand these instructions.  Will watch your condition.  Will get help right away if you are not doing well or get worse. Document Released: 10/30/2006 Document Revised: 09/11/2011 Document Reviewed: 12/31/2006 Exit[employer] Patient Information 2014 [employer], [place].   ________________________________________________________________________  WHAT IS A BLOOD TRANSFUSION? Blood Transfusion Information  A transfusion is the replacement of blood or some of its parts. Blood is made up of multiple cells which provide different functions.  Red blood cells carry oxygen and are used for blood loss replacement.  White blood cells fight against infection.  Platelets control bleeding.  Plasma helps clot blood.  Other blood products are available for specialized needs, such as hemophilia or other clotting disorders. BEFORE THE TRANSFUSION  Who gives blood for transfusions?   Healthy volunteers who are fully evaluated to make sure their blood is safe. This is blood bank blood. Transfusion therapy is the safest it has ever been in the practice of medicine. Before blood is taken from a donor, a complete history is taken to make sure that person has no history of diseases nor engages in risky social behavior (examples are intravenous drug use or sexual activity with multiple partners). The donor's travel history is screened to minimize risk of transmitting infections, such as malaria. The donated blood is tested for signs of infectious diseases, such as HIV and hepatitis. The blood is then tested to be sure it is compatible with you in order to minimize the chance of a transfusion reaction. If you or a  relative donates blood, this is often done in anticipation of surgery and is not appropriate for emergency situations. It takes many days to process the donated blood. RISKS AND COMPLICATIONS Although transfusion therapy is very safe and saves many lives, the main dangers of transfusion include:   Getting an infectious disease.  Developing a transfusion reaction. This is an allergic reaction  to something in the blood you were given. Every precaution is taken to prevent this. The decision to have a blood transfusion has been considered carefully by your caregiver before blood is given. Blood is not given unless the benefits outweigh the risks. AFTER THE TRANSFUSION  Right after receiving a blood transfusion, you will usually feel much better and more energetic. This is especially true if your red blood cells have gotten low (anemic). The transfusion raises the level of the red blood cells which carry oxygen, and this usually causes an energy increase.  The nurse administering the transfusion will monitor you carefully for complications. HOME CARE INSTRUCTIONS  No special instructions are needed after a transfusion. You may find your energy is better. Speak with your caregiver about any limitations on activity for underlying diseases you may have. SEEK MEDICAL CARE IF:   Your condition is not improving after your transfusion.  You develop redness or irritation at the intravenous (IV) site. SEEK IMMEDIATE MEDICAL CARE IF:  Any of the following symptoms occur over the next 12 hours:  Shaking chills.  You have a temperature by mouth above 102 F (38.9 C), not controlled by medicine.  Chest, back, or muscle pain.  People around you feel you are not acting correctly or are confused.  Shortness of breath or difficulty breathing.  Dizziness and fainting.  You get a rash or develop hives.  You have a decrease in urine output.  Your urine turns a dark color or changes to pink, red, or  brown. Any of the following symptoms occur over the next 10 days:  You have a temperature by mouth above 102 F (38.9 C), not controlled by medicine.  Shortness of breath.  Weakness after normal activity.  The white part of the eye turns yellow (jaundice).  You have a decrease in the amount of urine or are urinating less often.  Your urine turns a dark color or changes to pink, red, or brown. Document Released: 06/16/2000 Document Revised: 09/11/2011 Document Reviewed: 02/03/2008 [facility] Patient Information 2014 [place], [place].  _______________________________________________________________________

## 2017-07-18 NOTE — Progress Notes (Signed)
CT CHEST 07-09-17 Epic

## 2017-07-20 ENCOUNTER — Other Ambulatory Visit: Payer: Self-pay

## 2017-07-20 ENCOUNTER — Encounter (HOSPITAL_COMMUNITY)
Admission: RE | Admit: 2017-07-20 | Discharge: 2017-07-20 | Disposition: A | Discharge status: Home / Self Care | Payer: Medicare Other | Source: Ambulatory Visit | Attending: Gynecologic Oncology | Admitting: Gynecologic Oncology

## 2017-07-20 ENCOUNTER — Encounter (HOSPITAL_COMMUNITY): Payer: Self-pay

## 2017-07-20 DIAGNOSIS — Z01812 Encounter for preprocedural laboratory examination: Secondary | ICD-10-CM | POA: Diagnosis not present

## 2017-07-20 DIAGNOSIS — C569 Malignant neoplasm of unspecified ovary: Secondary | ICD-10-CM | POA: Insufficient documentation

## 2017-07-20 DIAGNOSIS — Z01810 Encounter for preprocedural cardiovascular examination: Secondary | ICD-10-CM | POA: Insufficient documentation

## 2017-07-20 HISTORY — DX: Age-related osteoporosis without current pathological fracture: M81.0

## 2017-07-20 HISTORY — DX: Atrial premature depolarization: I49.1

## 2017-07-20 LAB — URINALYSIS, ROUTINE W REFLEX MICROSCOPIC
Bilirubin Urine: NEGATIVE
Glucose, UA: NEGATIVE mg/dL
Hgb urine dipstick: NEGATIVE
Ketones, ur: NEGATIVE mg/dL
LEUKOCYTES UA: NEGATIVE
NITRITE: NEGATIVE
PH: 5 (ref 5.0–8.0)
Protein, ur: NEGATIVE mg/dL
SPECIFIC GRAVITY, URINE: 1.019 (ref 1.005–1.030)

## 2017-07-20 LAB — COMPREHENSIVE METABOLIC PANEL
ALBUMIN: 4 g/dL (ref 3.5–5.0)
ALK PHOS: 76 U/L (ref 38–126)
ALT: 12 U/L — AB (ref 14–54)
AST: 21 U/L (ref 15–41)
Anion gap: 7 (ref 5–15)
BILIRUBIN TOTAL: 0.5 mg/dL (ref 0.3–1.2)
BUN: 19 mg/dL (ref 6–20)
CO2: 29 mmol/L (ref 22–32)
CREATININE: 0.8 mg/dL (ref 0.44–1.00)
Calcium: 9.3 mg/dL (ref 8.9–10.3)
Chloride: 105 mmol/L (ref 101–111)
GFR calc Af Amer: >60 mL/min (ref >60)
GFR calc non Af Amer: >60 mL/min (ref >60)
GLUCOSE: 89 mg/dL (ref 65–99)
Potassium: 4.1 mmol/L (ref 3.5–5.1)
SODIUM: 141 mmol/L (ref 135–145)
TOTAL PROTEIN: 7.3 g/dL (ref 6.5–8.1)

## 2017-07-20 LAB — CBC
HEMATOCRIT: 29.7 % — AB (ref 36.0–46.0)
HEMOGLOBIN: 9.9 g/dL — AB (ref 12.0–15.0)
MCH: 30.8 pg (ref 26.0–34.0)
MCHC: 33.3 g/dL (ref 30.0–36.0)
MCV: 92.5 fL (ref 78.0–100.0)
Platelets: 270 10*3/uL (ref 150–400)
RBC: 3.21 MIL/uL — AB (ref 3.87–5.11)
RDW: 17.3 % — ABNORMAL HIGH (ref 11.5–15.5)
WBC: 11.4 10*3/uL — ABNORMAL HIGH (ref 4.0–10.5)

## 2017-07-20 NOTE — Progress Notes (Signed)
CBC routed via epic to Dr Everitt Amber

## 2017-07-21 LAB — ABO/RH: ABO/RH(D): O POS

## 2017-07-21 LAB — CA 125: CANCER ANTIGEN (CA) 125: 26.2 U/mL (ref 0.0–38.1)

## 2017-07-24 ENCOUNTER — Inpatient Hospital Stay (HOSPITAL_COMMUNITY): Payer: Medicare Other | Admitting: Certified Registered Nurse Anesthetist

## 2017-07-24 ENCOUNTER — Encounter (HOSPITAL_COMMUNITY): Payer: Self-pay | Admitting: *Deleted

## 2017-07-24 ENCOUNTER — Ambulatory Visit (HOSPITAL_COMMUNITY)
Admission: RE | Admit: 2017-07-24 | Discharge: 2017-07-26 | Disposition: A | Discharge status: Home / Self Care | Payer: Medicare Other | Source: Ambulatory Visit | Attending: Gynecologic Oncology | Admitting: Gynecologic Oncology

## 2017-07-24 ENCOUNTER — Encounter (HOSPITAL_COMMUNITY)
Admission: RE | Disposition: A | Discharge status: Home / Self Care | Payer: Self-pay | Source: Ambulatory Visit | Attending: Gynecologic Oncology

## 2017-07-24 ENCOUNTER — Other Ambulatory Visit: Payer: Self-pay

## 2017-07-24 DIAGNOSIS — C5701 Malignant neoplasm of right fallopian tube: Secondary | ICD-10-CM | POA: Diagnosis not present

## 2017-07-24 DIAGNOSIS — C561 Malignant neoplasm of right ovary: Secondary | ICD-10-CM | POA: Insufficient documentation

## 2017-07-24 DIAGNOSIS — C569 Malignant neoplasm of unspecified ovary: Secondary | ICD-10-CM | POA: Diagnosis present

## 2017-07-24 DIAGNOSIS — T8089XA Other complications following infusion, transfusion and therapeutic injection, initial encounter: Secondary | ICD-10-CM

## 2017-07-24 DIAGNOSIS — D62 Acute posthemorrhagic anemia: Secondary | ICD-10-CM | POA: Diagnosis not present

## 2017-07-24 DIAGNOSIS — C786 Secondary malignant neoplasm of retroperitoneum and peritoneum: Secondary | ICD-10-CM | POA: Diagnosis present

## 2017-07-24 DIAGNOSIS — C5702 Malignant neoplasm of left fallopian tube: Secondary | ICD-10-CM | POA: Diagnosis not present

## 2017-07-24 DIAGNOSIS — Y848 Other medical procedures as the cause of abnormal reaction of the patient, or of later complication, without mention of misadventure at the time of the procedure: Secondary | ICD-10-CM

## 2017-07-24 DIAGNOSIS — Z79899 Other long term (current) drug therapy: Secondary | ICD-10-CM | POA: Insufficient documentation

## 2017-07-24 DIAGNOSIS — C801 Malignant (primary) neoplasm, unspecified: Secondary | ICD-10-CM

## 2017-07-24 DIAGNOSIS — C562 Malignant neoplasm of left ovary: Secondary | ICD-10-CM | POA: Diagnosis not present

## 2017-07-24 DIAGNOSIS — C539 Malignant neoplasm of cervix uteri, unspecified: Secondary | ICD-10-CM | POA: Insufficient documentation

## 2017-07-24 DIAGNOSIS — Z9221 Personal history of antineoplastic chemotherapy: Secondary | ICD-10-CM | POA: Insufficient documentation

## 2017-07-24 DIAGNOSIS — R21 Rash and other nonspecific skin eruption: Secondary | ICD-10-CM

## 2017-07-24 HISTORY — PX: ROBOTIC ASSISTED TOTAL HYSTERECTOMY WITH BILATERAL SALPINGO OOPHERECTOMY: SHX6086

## 2017-07-24 HISTORY — PX: OMENTECTOMY: SHX5985

## 2017-07-24 HISTORY — PX: DEBULKING: SHX6277

## 2017-07-24 LAB — CBC
HEMATOCRIT: 25 % — AB (ref 36.0–46.0)
HEMOGLOBIN: 8.6 g/dL — AB (ref 12.0–15.0)
MCH: 30.6 pg (ref 26.0–34.0)
MCHC: 34.4 g/dL (ref 30.0–36.0)
MCV: 89 fL (ref 78.0–100.0)
PLATELETS: 253 10*3/uL (ref 150–400)
RBC: 2.81 MIL/uL — AB (ref 3.87–5.11)
RDW: 17.4 % — ABNORMAL HIGH (ref 11.5–15.5)
WBC: 25.7 10*3/uL — AB (ref 4.0–10.5)

## 2017-07-24 LAB — APTT: aPTT: 41 seconds — ABNORMAL HIGH (ref 24–36)

## 2017-07-24 LAB — PROTIME-INR
INR: 1.2
Prothrombin Time: 15.1 seconds (ref 11.4–15.2)

## 2017-07-24 LAB — PREPARE RBC (CROSSMATCH)

## 2017-07-24 SURGERY — HYSTERECTOMY, TOTAL, ROBOT-ASSISTED, LAPAROSCOPIC, WITH BILATERAL SALPINGO-OOPHORECTOMY
Anesthesia: General | Site: Abdomen

## 2017-07-24 MED ORDER — ROCURONIUM BROMIDE 50 MG/5ML IV SOSY
PREFILLED_SYRINGE | INTRAVENOUS | Status: DC | PRN
  Administered 2017-07-24 (×2): 10 mg via INTRAVENOUS
  Administered 2017-07-24: 35 mg via INTRAVENOUS
  Administered 2017-07-24: 20 mg via INTRAVENOUS
  Administered 2017-07-24: 10 mg via INTRAVENOUS

## 2017-07-24 MED ORDER — LIDOCAINE 2% (20 MG/ML) 5 ML SYRINGE
INTRAMUSCULAR | Status: AC
  Filled 2017-07-24: qty 5

## 2017-07-24 MED ORDER — PROPOFOL 10 MG/ML IV BOLUS
INTRAVENOUS | Status: DC | PRN
  Administered 2017-07-24: 100 mg via INTRAVENOUS

## 2017-07-24 MED ORDER — HYDROCODONE-ACETAMINOPHEN 7.5-325 MG PO TABS: 1.0000 | ORAL_TABLET | Freq: Once | ORAL | Status: DC | PRN

## 2017-07-24 MED ORDER — LACTATED RINGERS IV SOLN
INTRAVENOUS | Status: DC
  Administered 2017-07-24 (×3): via INTRAVENOUS

## 2017-07-24 MED ORDER — CELECOXIB 200 MG PO CAPS
400.0000 mg | ORAL_CAPSULE | ORAL | Status: AC
  Administered 2017-07-24: 400 mg via ORAL
  Filled 2017-07-24: qty 2

## 2017-07-24 MED ORDER — SCOPOLAMINE 1 MG/3DAYS TD PT72
1.0000 | MEDICATED_PATCH | TRANSDERMAL | Status: DC
  Administered 2017-07-24: 1.5 mg via TRANSDERMAL
  Filled 2017-07-24: qty 1

## 2017-07-24 MED ORDER — MEPERIDINE HCL 50 MG/ML IJ SOLN: 6.2500 mg | INTRAMUSCULAR | Status: DC | PRN

## 2017-07-24 MED ORDER — LIDOCAINE 2% (20 MG/ML) 5 ML SYRINGE
INTRAMUSCULAR | Status: DC | PRN
  Administered 2017-07-24: 50 mg via INTRAVENOUS

## 2017-07-24 MED ORDER — ONDANSETRON HCL 4 MG/2ML IJ SOLN
INTRAMUSCULAR | Status: AC
  Filled 2017-07-24: qty 2

## 2017-07-24 MED ORDER — ROCURONIUM BROMIDE 50 MG/5ML IV SOSY
PREFILLED_SYRINGE | INTRAVENOUS | Status: AC
  Filled 2017-07-24: qty 5

## 2017-07-24 MED ORDER — KCL IN DEXTROSE-NACL 20-5-0.45 MEQ/L-%-% IV SOLN
INTRAVENOUS | Status: DC
  Administered 2017-07-24 – 2017-07-25 (×2): via INTRAVENOUS
  Filled 2017-07-24 (×4): qty 1000

## 2017-07-24 MED ORDER — SENNOSIDES-DOCUSATE SODIUM 8.6-50 MG PO TABS
2.0000 | ORAL_TABLET | Freq: Every day | ORAL | Status: DC
  Administered 2017-07-24 – 2017-07-25 (×2): 2 via ORAL
  Filled 2017-07-24 (×2): qty 2

## 2017-07-24 MED ORDER — ACETAMINOPHEN 500 MG PO TABS
1000.0000 mg | ORAL_TABLET | Freq: Two times a day (BID) | ORAL | Status: DC
  Administered 2017-07-24 – 2017-07-26 (×4): 1000 mg via ORAL
  Filled 2017-07-24 (×4): qty 2

## 2017-07-24 MED ORDER — HYDROMORPHONE HCL 1 MG/ML IJ SOLN
0.2500 mg | INTRAMUSCULAR | Status: DC | PRN
  Administered 2017-07-24 (×2): 0.5 mg via INTRAVENOUS

## 2017-07-24 MED ORDER — HYDROMORPHONE HCL 1 MG/ML IJ SOLN: 0.2000 mg | INTRAMUSCULAR | Status: DC | PRN

## 2017-07-24 MED ORDER — ONDANSETRON HCL 4 MG/2ML IJ SOLN
4.0000 mg | Freq: Four times a day (QID) | INTRAMUSCULAR | Status: DC | PRN
  Filled 2017-07-24: qty 2

## 2017-07-24 MED ORDER — ONDANSETRON HCL 4 MG PO TABS: 4.0000 mg | ORAL_TABLET | Freq: Four times a day (QID) | ORAL | Status: DC | PRN

## 2017-07-24 MED ORDER — CEFOXITIN SODIUM 2 G IV SOLR
2.0000 g | INTRAVENOUS | Status: AC
  Administered 2017-07-24: 2 g via INTRAVENOUS
  Filled 2017-07-24: qty 2

## 2017-07-24 MED ORDER — LIDOCAINE 2% (20 MG/ML) 5 ML SYRINGE
INTRAMUSCULAR | Status: DC | PRN
  Administered 2017-07-24: 1 mg/kg/h via INTRAVENOUS

## 2017-07-24 MED ORDER — TRAMADOL HCL 50 MG PO TABS: 100.0000 mg | ORAL_TABLET | Freq: Two times a day (BID) | ORAL | Status: DC | PRN

## 2017-07-24 MED ORDER — ENOXAPARIN SODIUM 40 MG/0.4ML ~~LOC~~ SOLN
40.0000 mg | SUBCUTANEOUS | Status: AC
  Administered 2017-07-24: 40 mg via SUBCUTANEOUS
  Filled 2017-07-24: qty 0.4

## 2017-07-24 MED ORDER — FENTANYL CITRATE (PF) 100 MCG/2ML IJ SOLN
INTRAMUSCULAR | Status: DC | PRN
  Administered 2017-07-24 (×7): 50 ug via INTRAVENOUS

## 2017-07-24 MED ORDER — FENTANYL CITRATE (PF) 100 MCG/2ML IJ SOLN
INTRAMUSCULAR | Status: AC
  Filled 2017-07-24: qty 2

## 2017-07-24 MED ORDER — PHENYLEPHRINE 40 MCG/ML (10ML) SYRINGE FOR IV PUSH (FOR BLOOD PRESSURE SUPPORT)
PREFILLED_SYRINGE | INTRAVENOUS | Status: AC
  Filled 2017-07-24: qty 10

## 2017-07-24 MED ORDER — SODIUM CHLORIDE 0.9 % IV SOLN
INTRAVENOUS | Status: DC | PRN
  Administered 2017-07-24: 16:00:00 via INTRAVENOUS

## 2017-07-24 MED ORDER — INDIGOTINDISULFONATE SODIUM 8 MG/ML IJ SOLN
INTRAMUSCULAR | Status: DC | PRN
  Administered 2017-07-24: 5 mL via INTRAVENOUS

## 2017-07-24 MED ORDER — BUPIVACAINE LIPOSOME 1.3 % IJ SUSP
20.0000 mL | Freq: Once | INTRAMUSCULAR | Status: DC
  Filled 2017-07-24: qty 20

## 2017-07-24 MED ORDER — ACETAMINOPHEN 10 MG/ML IV SOLN
1000.0000 mg | Freq: Once | INTRAVENOUS | Status: DC | PRN
Start: 2017-07-24 — End: 2017-07-24

## 2017-07-24 MED ORDER — FENTANYL CITRATE (PF) 250 MCG/5ML IJ SOLN
INTRAMUSCULAR | Status: AC
  Filled 2017-07-24: qty 5

## 2017-07-24 MED ORDER — SUGAMMADEX SODIUM 200 MG/2ML IV SOLN
INTRAVENOUS | Status: AC
  Filled 2017-07-24: qty 2

## 2017-07-24 MED ORDER — HYDROMORPHONE HCL 1 MG/ML IJ SOLN
INTRAMUSCULAR | Status: AC
  Filled 2017-07-24: qty 1

## 2017-07-24 MED ORDER — OXYCODONE HCL 5 MG PO TABS: 5.0000 mg | ORAL_TABLET | ORAL | Status: DC | PRN

## 2017-07-24 MED ORDER — DEXAMETHASONE SODIUM PHOSPHATE 4 MG/ML IJ SOLN
4.0000 mg | INTRAMUSCULAR | Status: AC
  Administered 2017-07-24: 4 mg via INTRAVENOUS

## 2017-07-24 MED ORDER — HEMOSTATIC AGENTS (NO CHARGE) OPTIME
TOPICAL | Status: DC | PRN
  Administered 2017-07-24: 2 via TOPICAL
  Administered 2017-07-24: 1 via TOPICAL
  Administered 2017-07-24: 2 via TOPICAL

## 2017-07-24 MED ORDER — INDIGOTINDISULFONATE SODIUM 8 MG/ML IJ SOLN
INTRAMUSCULAR | Status: AC
  Filled 2017-07-24: qty 5

## 2017-07-24 MED ORDER — PROMETHAZINE HCL 25 MG/ML IJ SOLN: 6.2500 mg | INTRAMUSCULAR | Status: DC | PRN

## 2017-07-24 MED ORDER — HYDROMORPHONE HCL 1 MG/ML IJ SOLN: 0.2500 mg | INTRAMUSCULAR | Status: DC | PRN

## 2017-07-24 MED ORDER — PROPOFOL 10 MG/ML IV BOLUS
INTRAVENOUS | Status: AC
  Filled 2017-07-24: qty 20

## 2017-07-24 MED ORDER — KETAMINE HCL 10 MG/ML IJ SOLN
INTRAMUSCULAR | Status: DC | PRN
  Administered 2017-07-24: 30 mg via INTRAVENOUS

## 2017-07-24 MED ORDER — GABAPENTIN 300 MG PO CAPS
300.0000 mg | ORAL_CAPSULE | ORAL | Status: AC
  Administered 2017-07-24: 300 mg via ORAL
  Filled 2017-07-24: qty 1

## 2017-07-24 MED ORDER — GABAPENTIN 300 MG PO CAPS
300.0000 mg | ORAL_CAPSULE | Freq: Every day | ORAL | Status: AC
  Administered 2017-07-24: 300 mg via ORAL
  Filled 2017-07-24: qty 1

## 2017-07-24 MED ORDER — ACETAMINOPHEN 10 MG/ML IV SOLN: 1000.0000 mg | Freq: Once | INTRAVENOUS | Status: DC | PRN

## 2017-07-24 MED ORDER — ACETAMINOPHEN 500 MG PO TABS
1000.0000 mg | ORAL_TABLET | ORAL | Status: AC
  Administered 2017-07-24: 1000 mg via ORAL
  Filled 2017-07-24: qty 2

## 2017-07-24 SURGICAL SUPPLY — 65 items
ADH SKN CLS APL DERMABOND .7 (GAUZE/BANDAGES/DRESSINGS) ×1
AGENT HMST KT MTR STRL THRMB (HEMOSTASIS)
APL ESCP 34 STRL LF DISP (HEMOSTASIS) ×1
APPLICATOR COTTON TIP 6IN STRL (MISCELLANEOUS) ×1 IMPLANT
APPLICATOR SURGIFLO ENDO (HEMOSTASIS) ×1 IMPLANT
BAG LAPAROSCOPIC 12 15 PORT 16 (BASKET) IMPLANT
BAG RETRIEVAL 12/15 (BASKET)
BAG SPEC RTRVL LRG 6X4 10 (ENDOMECHANICALS) ×1
CATH FOLEY 2WAY SLVR  5CC 14FR (CATHETERS) ×1
CATH FOLEY 2WAY SLVR 5CC 14FR (CATHETERS) IMPLANT
COVER BACK TABLE 60X90IN (DRAPES) ×2 IMPLANT
COVER TIP SHEARS 8 DVNC (MISCELLANEOUS) ×1 IMPLANT
COVER TIP SHEARS 8MM DA VINCI (MISCELLANEOUS) ×1
DERMABOND ADVANCED (GAUZE/BANDAGES/DRESSINGS) ×1
DERMABOND ADVANCED .7 DNX12 (GAUZE/BANDAGES/DRESSINGS) ×1 IMPLANT
DRAPE ARM DVNC X/XI (DISPOSABLE) ×4 IMPLANT
DRAPE COLUMN DVNC XI (DISPOSABLE) ×1 IMPLANT
DRAPE DA VINCI XI ARM (DISPOSABLE) ×4
DRAPE DA VINCI XI COLUMN (DISPOSABLE) ×1
DRAPE SHEET LG 3/4 BI-LAMINATE (DRAPES) ×2 IMPLANT
DRAPE SURG IRRIG POUCH 19X23 (DRAPES) ×2 IMPLANT
ELECT REM PT RETURN 15FT ADLT (MISCELLANEOUS) ×2 IMPLANT
FLOSEAL 10ML (HEMOSTASIS) ×5 IMPLANT
GLOVE BIO SURGEON STRL SZ 6 (GLOVE) ×8 IMPLANT
GLOVE BIO SURGEON STRL SZ 6.5 (GLOVE) ×4 IMPLANT
GOWN STRL REUS W/ TWL LRG LVL3 (GOWN DISPOSABLE) ×2 IMPLANT
GOWN STRL REUS W/TWL LRG LVL3 (GOWN DISPOSABLE) ×6
HEMOSTAT SURGICEL 4X8 (HEMOSTASIS) ×1 IMPLANT
HOLDER FOLEY CATH W/STRAP (MISCELLANEOUS) ×2 IMPLANT
IRRIG SUCT STRYKERFLOW 2 WTIP (MISCELLANEOUS) ×2
IRRIGATION SUCT STRKRFLW 2 WTP (MISCELLANEOUS) ×1 IMPLANT
KIT PROCEDURE DA VINCI SI (MISCELLANEOUS)
KIT PROCEDURE DVNC SI (MISCELLANEOUS) IMPLANT
LUBRICANT JELLY K Y 4OZ (MISCELLANEOUS) ×1 IMPLANT
MANIPULATOR UTERINE 4.5 ZUMI (MISCELLANEOUS) ×2 IMPLANT
NDL SAFETY ECLIPSE 18X1.5 (NEEDLE) ×1 IMPLANT
NDL SPNL 18GX3.5 QUINCKE PK (NEEDLE) ×1 IMPLANT
NEEDLE HYPO 18GX1.5 SHARP (NEEDLE)
NEEDLE SPNL 18GX3.5 QUINCKE PK (NEEDLE) IMPLANT
OBTURATOR OPTICAL STANDARD 8MM (TROCAR) ×1
OBTURATOR OPTICAL STND 8 DVNC (TROCAR) ×1
OBTURATOR OPTICALSTD 8 DVNC (TROCAR) ×1 IMPLANT
PACK ROBOT GYN CUSTOM WL (TRAY / TRAY PROCEDURE) ×2 IMPLANT
PAD POSITIONING PINK XL (MISCELLANEOUS) ×2 IMPLANT
POUCH SPECIMEN RETRIEVAL 10MM (ENDOMECHANICALS) ×1 IMPLANT
SEAL CANN UNIV 5-8 DVNC XI (MISCELLANEOUS) ×4 IMPLANT
SEAL XI 5MM-8MM UNIVERSAL (MISCELLANEOUS) ×4
SEALER VESSEL DA VINCI XI (MISCELLANEOUS) ×1
SEALER VESSEL EXT DVNC XI (MISCELLANEOUS) IMPLANT
SET CYSTO W/LG BORE CLAMP LF (SET/KITS/TRAYS/PACK) ×1 IMPLANT
SET TRI-LUMEN FLTR TB AIRSEAL (TUBING) ×2 IMPLANT
SLEEVE SURGEON STRL (DRAPES) ×1 IMPLANT
SOLUTION ELECTROLUBE (MISCELLANEOUS) ×1 IMPLANT
SURGIFLO W/THROMBIN 8M KIT (HEMOSTASIS) IMPLANT
SUT VIC AB 0 CT1 27 (SUTURE)
SUT VIC AB 0 CT1 27XBRD ANTBC (SUTURE) IMPLANT
SUT VIC AB 3-0 SH 27 (SUTURE) ×6
SUT VIC AB 3-0 SH 27XBRD (SUTURE) IMPLANT
SYR 10ML LL (SYRINGE) ×2 IMPLANT
TOWEL OR NON WOVEN STRL DISP B (DISPOSABLE) ×2 IMPLANT
TRAP SPECIMEN MUCOUS 40CC (MISCELLANEOUS) IMPLANT
TRAY FOLEY W/METER SILVER 16FR (SET/KITS/TRAYS/PACK) ×2 IMPLANT
UNDERPAD 30X30 (UNDERPADS AND DIAPERS) ×2 IMPLANT
WATER STERILE IRR 1000ML POUR (IV SOLUTION) ×3 IMPLANT
WATER STERILE IRR 1000ML UROMA (IV SOLUTION) ×1 IMPLANT

## 2017-07-24 NOTE — Anesthesia Procedure Notes (Signed)
Procedure Name: Intubation Date/Time: 07/24/2017 1:18 PM Performed by: Montel Clock, CRNA Pre-anesthesia Checklist: Patient identified, Emergency Drugs available, Suction available, Patient being monitored and Timeout performed Patient Re-evaluated:Patient Re-evaluated prior to induction Oxygen Delivery Method: Circle system utilized Preoxygenation: Pre-oxygenation with 100% oxygen Induction Type: IV induction Ventilation: Mask ventilation without difficulty and Oral airway inserted - appropriate to patient size Laryngoscope Size: Mac and 3 Grade View: Grade I Tube type: Oral Tube size: 7.0 mm Number of attempts: 1 Airway Equipment and Method: Stylet Placement Confirmation: ETT inserted through vocal cords under direct vision,  positive ETCO2 and breath sounds checked- equal and bilateral Secured at: 21 cm Tube secured with: Tape Dental Injury: Teeth and Oropharynx as per pre-operative assessment

## 2017-07-24 NOTE — Op Note (Signed)
OPERATIVE NOTE 07/24/17  Surgeon: [name]   Assistants: Dr Lahoma Crocker (an MD assistant was necessary for tissue manipulation, management of robotic instrumentation, retraction and positioning due to the complexity of the case and hospital policies).   Anesthesia: General endotracheal anesthesia  ASA Class: 3   Pre-operative Diagnosis: ovarian cancer, stage IV, s/p neoadjuvant chemotherapy x 3 cycles  Post-operative Diagnosis: same  Operation: Robotic-assisted laparoscopic total hysterectomy with bilateral salpingoophorectomy, omentectomy, radical tumor debulking  Surgeon: [name]  Assistant Surgeon: Lahoma Crocker MD  Anesthesia: GET  Urine Output: 100cc (blue from indigo carmine, no blood)  Operative Findings:  : 3cm left omental/splenic flexure tumor nodule adherent to transverse colon. 10cm pelvic tumor from left ovary, adherent to uterus and rectum, necrotic. Left retroperitoneal infiltration.  Tumor rind remaining on posterior cul de sac and distal rectum - very thin, <1cm thick, representing optimal cytoreductive procedure.   Generalized ooze from pelvic structures and clinical coagulapathy.   Estimated Blood Loss:  450      Total IV Fluids: 1,000 ml, 1 unit PRBC, 2 units FFP         Specimens: omentum, uterus, cervix, bilateral tubes and ovaries.         Complications:  None; patient tolerated the procedure well.         Disposition: PACU - hemodynamically stable.  Procedure Details  The patient was seen in the Holding Room. The risks, benefits, complications, treatment options, and expected outcomes were discussed with the patient.  The patient concurred with the proposed plan, giving informed consent.  The site of surgery properly noted/marked. The patient was identified as Colleen Mckinney and the procedure verified as a Robotic-assisted hysterectomy with bilateral salpingo oophorectomy. A Time Out was held and the above information  confirmed.  After induction of anesthesia, the patient was draped and prepped in the usual sterile manner. Pt was placed in supine position after anesthesia and draped and prepped in the usual sterile manner. The abdominal drape was placed after the CholoraPrep had been allowed to dry for 3 minutes.  Her arms were tucked to her side with all appropriate precautions.  The shoulders were stabilized with padded shoulder blocks applied to the acromium processes.  The patient was placed in the semi-lithotomy position in [place].  The perineum was prepped with Betadine. The patient was then prepped. Foley catheter was placed.  A sterile speculum was placed in the vagina.  The cervix was grasped with a single-tooth tenaculum and dilated with Kennon Rounds dilators.  The ZUMI uterine manipulator with a medium colpotomizer ring was placed without difficulty.  A pneum occluder balloon was placed over the manipulator.  OG tube placement was confirmed and to suction.   Next, a 5 mm skin incision was made 1 cm below the subcostal margin in the midclavicular line.  The 5 mm Optiview port and scope was used for direct entry.  Opening pressure was under 10 mm CO2.  The abdomen was insufflated and the findings were noted as above.   At this point and all points during the procedure, the patient's intra-abdominal pressure did not exceed 15 mmHg. Next, a 10 mm skin incision was made in the umbilicus and a right and left port was placed about 10 cm lateral to the robot port on the right and left side.  A fourth arm was placed in the left lower quadrant 2 cm above and superior and medial to the anterior superior iliac spine.  All ports  were placed under direct visualization.    A thorough inspection of the peritoneal cavity revealed the previously stated findings.  It was felt that surgery by her minimally invasive approach was amenable.  The robot was docked to approach the upper abdomen.  The omentum was carefully dissected free  from its attachments to the transverse colon using sharp dissection and judicious bursts of monopolar energy.  The 3 cm omental nodule was removed with sharp dissection from the underlying transverse colon with a small amount of minimal tumor rind remaining on the transverse colon.  Windows were made in the omentum from the splenic flexure to skeletonized the short gastric vessels.  When the omentum was separated from its attachments to bowel and stomach it was placed in an Endo Catch bag.  Hemostasis was observed at the surgical bed.  The robot was undocked.  The boom was rotated. The patient was placed in steep Trendelenburg.  Bowel was folded away into the upper abdomen.  The robot was docked in the normal manner to approach the pelvis.    There were loops of small bowel adherent to the uterine fundus and left ovary tumor which were sharply dissected free using the scissors. Sharp dissection was employed to dissect the left ovarian tumor from where it was densely adherent to the sigmoid colon and anterior wall of the rectum. There was extensive tumor infilration into the left retroperitoneum.   The hysterectomy was started after the round ligament on the right side was incised and the retroperitoneum was entered and the pararectal space was developed.  The clipped ureter was noted to be on the medial leaf of the broad ligament. The patient had a history of a prior right nephrectomy. The peritoneum above the ureter was incised and stretched and the infundibulopelvic ligament was skeletonized, cauterized and cut. The posterior tumor was freed enough that the posterior peritoneum could be taken down to the level of the KOH ring.  There was extensive tumor rind on the anterior uterine body adhering the bladder to the uterine body. Using sharp dissection, the anterior peritoneum was taken down to expose the koh cup.  The bladder flap was created to the level of the KOH ring.  The uterine artery on the right  side was skeletonized, cauterized and cut in the normal manner.   The left retroperitoneum was exposed by opening up the peritoneum parallel to the infundibulopelvic ligament on the left side and transecting the left round ligament.  This aided in identifying the left ureter in the medial leaf.  A window was generated in the medial leaf ventral to this ureter which skeletonized the left ovarian vessels which were then bipolar sealed and transected.  The posterior peritoneum was taken down however due to retroperitoneal infiltration extensive left ureteral lysis was necessary.  The left ureter was skeletonized 360 degrees, and on tunneled from where it passed under the uterine artery.  The uterine artery was skeletonized at its origin and bipolar sealed and transected.  The ureter was on tunneled and freed from the cardinal ligament and the retroperitoneal tumor infiltration allowing it to fall laterally.  This created exposure to the uterosacral ligament could be transected in such a way as to incorporate tumor with the uterine specimen.  A tissue plane was generated to free the left ovarian tumor mass from the anterior wall of the rectum and elevated such that the covering could be palpated posteriorly.  There remained a small tumor rind less than 1 cm in  thickness in the posterior cul-de-sac.  The anterior dissection was completed on the left to mobilize the bladder and ureter off of the covering and skeletonized the uterine arteries again at the isthmus which were transected and fulgurated.  The colpotomy was made and the uterus, cervix, bilateral ovaries and tubes were amputated and delivered through the vagina.   The colpotomy at the vaginal cuff was closed with Vicryl on a CT1 needle in a running manner.  Irrigation was used and excellent hemostasis was achieved.    3-0 Vicryl suture was used in interrupted fashion to imbricate an area of sigmoid colon that had been adherent to the ovarian tumor mass  that had been deserosalized.  There was no breach of the rectal wall or sigmoid colon appreciated.  The patient was given an amp of indigo combine and her urine was running clear of blue with no blood and no air in the Foley bag at the completion of the procedure.  There was no leakage of indigo carmine stained urine into the peritoneal cavity observed.  They continue to be oozing generally from the pelvic at all sites of dissection particularly the right deep pelvis on the ventral surface of the sacrum and the left uterosacral ligament and tumor rind.  Bipolar energy was employed to seal individual vessels, 3-0 Vicryl figure-of-eight sutures were utilized however neither of these approaches achieve adequate hemostasis due to the apparent coagulopathy present.  Large volume of Surgi-Flo was applied to the pelvic cul-de-sac and in the sacral hollow and pressure was applied Surgicel was also applied with these agents adequate hemostasis was achieved.  Patient was also transfused 1 unit of packed red cells and 2 units of FFP given the significant blood loss of 450 cc in the apparent clinical coagulopathy.    Several minutes of observation took place with no active bleeding observed. It was established that hemostasis had been achieved. At this point in the procedure was completed.  Robotic instruments were removed under direct visulaization.  The robot was undocked. An attempt was made to perform cystoscopy, however, the patient's urethral meatus was too small to accomodate any of the scopes present in the hosptial. Given the appearance of urine that was not contaminated by blood and the well skeletonized ureter clearly seen from the surgical field, it was obvious that no transection or obstruction of her single left ureter had taken place and therefore cystoscopy was aborted.  The 10 mm ports were closed with Vicryl on a UR-5 needle and the fascia was closed with 0 Vicryl on a UR-5 needle.  The skin was closed  with 4-0 Vicryl in a subcuticular manner.  Dermabond was applied.  Sponge, lap and needle counts correct x 2.  The patient was taken to the recovery room in stable condition.  The vagina was swabbed with  minimal bleeding noted.   All instrument and needle counts were correct x  3.   The patient was transferred to the recovery room in a stable condition.  [name], MD

## 2017-07-24 NOTE — Anesthesia Postprocedure Evaluation (Signed)
Anesthesia Post Note  Patient: Colleen Mckinney  Procedure(s) Performed: XI ROBOTIC ASSISTED TOTAL HYSTERECTOMY WITH BILATERAL SALPINGO OOPHORECTOMY (N/A Abdomen) OMENTECTOMY (N/A Abdomen) RADICAL TUMOR DEBULKING (N/A Abdomen)     Patient location during evaluation: PACU Anesthesia Type: General Level of consciousness: awake and alert Pain management: pain level controlled Vital Signs Assessment: post-procedure vital signs reviewed and stable Respiratory status: spontaneous breathing, nonlabored ventilation, respiratory function stable and patient connected to nasal cannula oxygen Cardiovascular status: blood pressure returned to baseline and stable Postop Assessment: no apparent nausea or vomiting Anesthetic complications: no    Last Vitals:  Vitals:   07/24/17 1800 07/24/17 1826  BP: (!) 127/59 (!) 141/66  Pulse: 76 87  Resp: 10 12  Temp: (!) 36.3 C (!) 36.4 C  SpO2: 97% 100%    Last Pain:  Vitals:   07/24/17 1800  TempSrc:   PainSc: [place]

## 2017-07-24 NOTE — Interval H&P Note (Signed)
History and Physical Interval Note:  07/24/2017 12:32 PM  Colleen Mckinney  has presented today for surgery, with the diagnosis of OVARIAN CANCER  The various methods of treatment have been discussed with the patient and family. After consideration of risks, benefits and other options for treatment, the patient has consented to  Procedure(s): XI ROBOTIC ASSISTED TOTAL HYSTERECTOMY WITH BILATERAL SALPINGO OOPHORECTOMY WITH POSSIBLE LAPAROTOMY (N/A) OMENTECTOMY (N/A) RADICAL TUMOR DEBULKING (N/A) as a surgical intervention .  The patient's history has been reviewed, patient examined, no change in status, stable for surgery.  I have reviewed the patient's chart and labs.  Questions were answered to the patient's satisfaction.     [name]

## 2017-07-24 NOTE — Transfer of Care (Signed)
Immediate Anesthesia Transfer of Care Note  Patient: Noele D Greenman  Procedure(s) Performed: XI ROBOTIC ASSISTED TOTAL HYSTERECTOMY WITH BILATERAL SALPINGO OOPHORECTOMY (N/A Abdomen) OMENTECTOMY (N/A Abdomen) RADICAL TUMOR DEBULKING (N/A Abdomen)  Patient Location: PACU  Anesthesia Type:General  Level of Consciousness: drowsy and patient cooperative  Airway & Oxygen Therapy: Patient Spontanous Breathing and Patient connected to face mask oxygen  Post-op Assessment: Report given to RN and Post -op Vital signs reviewed and stable  Post vital signs: Reviewed and stable  Last Vitals:  Vitals:   07/24/17 1203  BP: 137/87  Pulse: 80  Resp: 18  Temp: 36.7 C  SpO2: 100%    Last Pain:  Vitals:   07/24/17 1203  TempSrc: Oral      Patients Stated Pain Goal: 4 (94/07/68 0881)  Complications: No apparent anesthesia complications

## 2017-07-24 NOTE — Anesthesia Preprocedure Evaluation (Addendum)
Anesthesia Evaluation    Reviewed: Allergy & Precautions, Patient's Chart, lab work & pertinent test results  Airway Mallampati: II  TM Distance: >3 FB Neck ROM: Full    Dental no notable dental hx. (+) Teeth Intact   Pulmonary neg pulmonary ROS,    Pulmonary exam normal breath sounds clear to auscultation       Cardiovascular negative cardio ROS Normal cardiovascular exam Rhythm:Regular Rate:Normal     Neuro/Psych negative neurological ROS     GI/Hepatic negative GI ROS, Neg liver ROS,   Endo/Other  negative endocrine ROS  Renal/GU negative Renal ROS     Musculoskeletal negative musculoskeletal ROS (+)   Abdominal   Peds  Hematology negative hematology ROS (+)   Anesthesia Other Findings Day of surgery medications reviewed with the patient.  Reproductive/Obstetrics                            Lab Results  Component Value Date   WBC 11.4 (H) 07/20/2017   HGB 9.9 (L) 07/20/2017   HCT 29.7 (L) 07/20/2017   MCV 92.5 07/20/2017   PLT 270 07/20/2017   Lab Results  Component Value Date   CREATININE 0.80 07/20/2017   BUN 19 07/20/2017   NA 141 07/20/2017   K 4.1 07/20/2017   CL 105 07/20/2017   CO2 29 07/20/2017    Anesthesia Physical Anesthesia Plan  ASA: III  Anesthesia Plan: General   Post-op Pain Management:    Induction: Intravenous  PONV Risk Score and Plan: 4 or greater and Treatment may vary due to age or medical condition, Ondansetron and Dexamethasone  Airway Management Planned: Oral ETT  Additional Equipment:   Intra-op Plan:   Post-operative Plan: Extubation in OR  Informed Consent: I have reviewed the patients History and Physical, chart, labs and discussed the procedure including the risks, benefits and alternatives for the proposed anesthesia with the patient or authorized representative who has indicated his/her understanding and acceptance.   Dental  advisory given  Plan Discussed with:   Anesthesia Plan Comments:         Anesthesia Quick Evaluation

## 2017-07-25 ENCOUNTER — Encounter (HOSPITAL_COMMUNITY): Payer: Self-pay | Admitting: Gynecologic Oncology

## 2017-07-25 DIAGNOSIS — C562 Malignant neoplasm of left ovary: Secondary | ICD-10-CM | POA: Diagnosis not present

## 2017-07-25 LAB — CBC
HEMATOCRIT: 19.6 % — AB (ref 36.0–46.0)
Hemoglobin: 6.9 g/dL — CL (ref 12.0–15.0)
MCH: 31.2 pg (ref 26.0–34.0)
MCHC: 35.2 g/dL (ref 30.0–36.0)
MCV: 88.7 fL (ref 78.0–100.0)
Platelets: 237 10*3/uL (ref 150–400)
RBC: 2.21 MIL/uL — AB (ref 3.87–5.11)
RDW: 18 % — AB (ref 11.5–15.5)
WBC: 20.7 10*3/uL — AB (ref 4.0–10.5)

## 2017-07-25 LAB — BPAM FFP
BLOOD PRODUCT EXPIRATION DATE: 201901272359
Blood Product Expiration Date: 201901272359
ISSUE DATE / TIME: 201901221617
ISSUE DATE / TIME: 201901221617
UNIT TYPE AND RH: 5100
Unit Type and Rh: 5100

## 2017-07-25 LAB — PREPARE RBC (CROSSMATCH)

## 2017-07-25 LAB — PREPARE FRESH FROZEN PLASMA
UNIT DIVISION: 0
UNIT DIVISION: 0

## 2017-07-25 LAB — BASIC METABOLIC PANEL
ANION GAP: 6 (ref 5–15)
BUN: 17 mg/dL (ref 6–20)
CALCIUM: 7.9 mg/dL — AB (ref 8.9–10.3)
CO2: 28 mmol/L (ref 22–32)
Chloride: 102 mmol/L (ref 101–111)
Creatinine, Ser: 0.82 mg/dL (ref 0.44–1.00)
GLUCOSE: 116 mg/dL — AB (ref 65–99)
POTASSIUM: 4.2 mmol/L (ref 3.5–5.1)
Sodium: 136 mmol/L (ref 135–145)

## 2017-07-25 LAB — HEMOGLOBIN AND HEMATOCRIT, BLOOD
HEMATOCRIT: 22.2 % — AB (ref 36.0–46.0)
HEMOGLOBIN: 7.8 g/dL — AB (ref 12.0–15.0)

## 2017-07-25 MED ORDER — DIPHENHYDRAMINE HCL 50 MG/ML IJ SOLN
25.0000 mg | Freq: Once | INTRAMUSCULAR | Status: AC
  Administered 2017-07-25: 25 mg via INTRAVENOUS
  Filled 2017-07-25: qty 1

## 2017-07-25 MED ORDER — SODIUM CHLORIDE 0.9 % IV SOLN
Freq: Once | INTRAVENOUS | Status: AC
  Administered 2017-07-25: 12:00:00 via INTRAVENOUS

## 2017-07-25 MED ORDER — ENOXAPARIN (LOVENOX) PATIENT EDUCATION KIT
PACK | Freq: Once | Status: DC
  Filled 2017-07-25: qty 1

## 2017-07-25 NOTE — Progress Notes (Signed)
CRITICAL VALUE ALERT  Critical Value:  Hgb 6.9  Date & Time Notied:  07/25/17  0545  Provider Notified: Delsa Sale  Orders Received/Actions taken: Waiting on orders

## 2017-07-26 ENCOUNTER — Other Ambulatory Visit: Payer: Self-pay

## 2017-07-26 DIAGNOSIS — Z8249 Family history of ischemic heart disease and other diseases of the circulatory system: Secondary | ICD-10-CM

## 2017-07-26 DIAGNOSIS — N9972 Accidental puncture and laceration of a genitourinary system organ or structure during other procedure: Secondary | ICD-10-CM | POA: Diagnosis not present

## 2017-07-26 DIAGNOSIS — Z800 Family history of malignant neoplasm of digestive organs: Secondary | ICD-10-CM

## 2017-07-26 DIAGNOSIS — T8143XA Infection following a procedure, organ and space surgical site, initial encounter: Secondary | ICD-10-CM | POA: Diagnosis not present

## 2017-07-26 DIAGNOSIS — N99840 Postprocedural hematoma of a genitourinary system organ or structure following a genitourinary system procedure: Secondary | ICD-10-CM | POA: Diagnosis not present

## 2017-07-26 DIAGNOSIS — C569 Malignant neoplasm of unspecified ovary: Secondary | ICD-10-CM | POA: Diagnosis present

## 2017-07-26 DIAGNOSIS — R5082 Postprocedural fever: Secondary | ICD-10-CM | POA: Diagnosis not present

## 2017-07-26 DIAGNOSIS — M810 Age-related osteoporosis without current pathological fracture: Secondary | ICD-10-CM | POA: Diagnosis present

## 2017-07-26 DIAGNOSIS — R000 Tachycardia, unspecified: Secondary | ICD-10-CM | POA: Diagnosis present

## 2017-07-26 DIAGNOSIS — G620 Drug-induced polyneuropathy: Secondary | ICD-10-CM | POA: Diagnosis present

## 2017-07-26 DIAGNOSIS — D62 Acute posthemorrhagic anemia: Secondary | ICD-10-CM | POA: Diagnosis present

## 2017-07-26 DIAGNOSIS — D6481 Anemia due to antineoplastic chemotherapy: Secondary | ICD-10-CM | POA: Diagnosis present

## 2017-07-26 DIAGNOSIS — Z90710 Acquired absence of both cervix and uterus: Secondary | ICD-10-CM

## 2017-07-26 DIAGNOSIS — E876 Hypokalemia: Secondary | ICD-10-CM | POA: Diagnosis present

## 2017-07-26 DIAGNOSIS — T451X5A Adverse effect of antineoplastic and immunosuppressive drugs, initial encounter: Secondary | ICD-10-CM | POA: Diagnosis present

## 2017-07-26 DIAGNOSIS — R310 Gross hematuria: Secondary | ICD-10-CM | POA: Diagnosis not present

## 2017-07-26 DIAGNOSIS — Z8349 Family history of other endocrine, nutritional and metabolic diseases: Secondary | ICD-10-CM

## 2017-07-26 LAB — BPAM RBC
BLOOD PRODUCT EXPIRATION DATE: 201902212359
Blood Product Expiration Date: 201902212359
ISSUE DATE / TIME: 201901221550
ISSUE DATE / TIME: 201901231119
Unit Type and Rh: 5100
Unit Type and Rh: 5100

## 2017-07-26 LAB — CBC
HEMATOCRIT: 23.8 % — AB (ref 36.0–46.0)
Hemoglobin: 8.3 g/dL — ABNORMAL LOW (ref 12.0–15.0)
MCH: 31.2 pg (ref 26.0–34.0)
MCHC: 34.9 g/dL (ref 30.0–36.0)
MCV: 89.5 fL (ref 78.0–100.0)
Platelets: 212 10*3/uL (ref 150–400)
RBC: 2.66 MIL/uL — AB (ref 3.87–5.11)
RDW: 17.1 % — ABNORMAL HIGH (ref 11.5–15.5)
WBC: 11 10*3/uL — AB (ref 4.0–10.5)

## 2017-07-26 LAB — TYPE AND SCREEN
ABO/RH(D): O POS
Antibody Screen: NEGATIVE
UNIT DIVISION: 0
Unit division: 0

## 2017-07-26 MED ORDER — ENOXAPARIN SODIUM 40 MG/0.4ML ~~LOC~~ SOLN: 40.0000 mg | SUBCUTANEOUS | 0 refills | Status: DC

## 2017-07-26 MED ORDER — ENOXAPARIN SODIUM 40 MG/0.4ML ~~LOC~~ SOLN
40.0000 mg | SUBCUTANEOUS | Status: DC
  Administered 2017-07-26: 40 mg via SUBCUTANEOUS
  Filled 2017-07-26: qty 0.4

## 2017-07-26 MED ORDER — ENSURE ENLIVE PO LIQD
237.0000 mL | Freq: Two times a day (BID) | ORAL | Status: DC
  Administered 2017-07-26: 237 mL via ORAL

## 2017-07-26 NOTE — Discharge Summary (Signed)
Physician Discharge Summary  Patient ID: Colleen Mckinney MRN: [id number] DOB/AGE: Dec 28, 1945 72 y.o.  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 She was transfused 1 unit of PRBC. 4/ On POD 2 her Hb had improved and was stable. She had somewhat abdominal distension likely from  Ileus and intraperitoneal blood. Though she was passing flatus. 4/ On POD 2 evening the patient was meeting discharge criteria: tolerating PO, voiding urine, ambulating, pain well controlled on oral medications.  5/ new medications on discharge include lovenox 40 sub q day x 2 weeks, percocet, senakot.  Consults: None  Significant Diagnostic Studies: labs:  CBC    Component Value Date/Time   WBC 11.0 (H) 07/26/2017 1000   RBC 2.66 (L) 07/26/2017 1000   HGB 8.3 (L) 07/26/2017 1000   HGB 10.1 (L) 06/25/2017 1003   HCT 23.8 (L) 07/26/2017 1000   HCT 30.9 (L) 06/25/2017 1003   PLT 212 07/26/2017 1000   PLT 275 06/25/2017 1003   MCV 89.5 07/26/2017 1000   MCV 90.1 06/25/2017 1003   MCH 31.2 07/26/2017 1000   MCHC 34.9 07/26/2017 1000   RDW 17.1 (H) 07/26/2017 1000   RDW 18.1 (H) 06/25/2017 1003   LYMPHSABS 1.4 06/25/2017 1003   MONOABS 0.9 06/25/2017 1003   EOSABS 0.0 06/25/2017 1003   BASOSABS 0.1 06/25/2017 1003     Treatments: surgery: see above and blood transfusion  Discharge Exam: Blood pressure 138/73, pulse 95,  temperature 98.5 F (36.9 C), temperature source Tympanic, resp. rate 16, height _0  (1.626 m), weight 100 lb (45.4 kg), SpO2 96 %. General appearance: alert GI: soft, non-tender; bowel sounds normal; no masses,  no organomegaly Pelvic: no cervical motion tenderness and vagina normal without discharge Incision/Wound:clean and in tact. Mild abdominal distension  Disposition: 01-Home or Self Care    Current Facility-Administered Medications:  .  acetaminophen (TYLENOL) tablet 1,000 mg, 1,000 mg, Oral, Q12H, Lahoma Crocker, MD, 1,000 mg at 07/26/17 0839 .  dextrose 5 % and 0.45 % NaCl with KCl 20 mEq/L infusion, , Intravenous, Continuous, Lahoma Crocker, MD, Last Rate: 50 mL/hr at 07/25/17 1958 .  enoxaparin (LOVENOX) injection 40 mg, 40 mg, Subcutaneous, Q24H, Cross, Melissa D, NP, 40 mg at 07/26/17 1619 .  enoxaparin (LOVENOX) patient education kit, , Does not apply, Once, Lahoma Crocker, MD .  feeding supplement (ENSURE ENLIVE) (ENSURE ENLIVE) liquid 237 mL, 237 mL, Oral, BID BM, Everitt Amber, MD, 237 mL at 07/26/17 1353 .  HYDROmorphone (DILAUDID) injection 0.2-0.6 mg, 0.2-0.6 mg, Intravenous, Q4H PRN, Lahoma Crocker, MD .  ondansetron (ZOFRAN) tablet 4 mg, 4 mg, Oral, Q6H PRN **OR** ondansetron (ZOFRAN) injection 4 mg, 4 mg, Intravenous, Q6H PRN, Lahoma Crocker, MD .  oxyCODONE (Oxy IR/ROXICODONE) immediate release tablet 5 mg, 5 mg, Oral, Q4H PRN, Lahoma Crocker, MD .  senna-docusate (Senokot-S) tablet 2 tablet, 2 tablet, Oral, QHS, Lahoma Crocker, MD, Stopped at 07/25/17 2200 .  traMADol (ULTRAM) tablet 100 mg, 100 mg, Oral, Q12H  PRN, Lahoma Crocker, MD   Signed: [name] 07/26/2017, 4:31 PM

## 2017-07-26 NOTE — Progress Notes (Addendum)
Assessment unchanged. Pt and family verbalized understanding of dc instructions through teach back including when to call the MD and follow up care. Daughter demonstrated understanding of Lovenox administration prior to discharge, administered med to pt with good aseptic technique. Lovenox patient kit given. No scripts. Discharged via wc to front entrance accompanied by family and NT.

## 2017-07-26 NOTE — Progress Notes (Signed)
Moderate amount of bloody drainage noted from umbilical incision, incision cleaned with NS and covered with dry sterile gauzes and ABD pad at 2230, dressing assessed, no new drainage noted, patient is resting quietly in the bed, no acute distress noted, will continue to monitor.

## 2017-07-26 NOTE — Progress Notes (Signed)
.  2 Days Post-Op Procedure(s) (LRB): XI ROBOTIC ASSISTED TOTAL HYSTERECTOMY WITH BILATERAL SALPINGO OOPHORECTOMY (N/A) OMENTECTOMY (N/A) RADICAL TUMOR DEBULKING (N/A)  Subjective: Patient reports feeling unwell with bloating and fatigue this morning.  Last night she had oozing from her umbilical incision. It has resolved. She received a transfusion of 1 unit PRBC yesterday.   + flatus. No appetite. No BM. Pain fairly well controlled.   Objective: Vital signs in last 24 hours: Temp:  [97.9 F (36.6 C)-98.6 F (37 C)] 98.1 F (36.7 C) (01/24 0552) Pulse Rate:  [78-87] 84 (01/24 0552) Resp:  [16-17] 16 (01/24 0552) BP: (101-146)/(49-80) 146/80 (01/24 0552) SpO2:  [98 %-100 %] 98 % (01/24 0552) Last BM Date: 07/23/17  Intake/Output from previous day: 01/23 0701 - 01/24 0700 In: 1984 [P.O.:600; I.V.:1100; Blood:284] Out: 725 [Urine:725]  Physical Examination: General: alert and cooperative Resp: clear to auscultation bilaterally Cardio: regular rate and rhythm, S1, S2 normal, no murmur, click, rub or gallop GI: soft, non-tender; bowel sounds normal; no masses,  no organomegaly and incision: clean, dry and intact distended tympanitic abdomen. No guarding. Dressing dry and no active bleeding from the umbilicus.   Labs: WBC/Hgb/Hct/Plts:  --/7.8/22.2/-- (01/23 1459)     Assessment:  72 y.o. s/p Procedure(s): XI ROBOTIC ASSISTED TOTAL HYSTERECTOMY WITH BILATERAL SALPINGO OOPHORECTOMY OMENTECTOMY RADICAL TUMOR DEBULKING: stable Pain:  Pain is well-controlled on oral medications.  Heme:Anemia: s/p 1 unit PRBC postop. Recheck this morning for stability. No apparent signs of active bleeding.  ID: elevated WBC likely secondary to stress response. Afebrile, downward trend in WBC.   CV: hemodynamically stable. No clinical signs of active bleeding.   GI:  Tolerating po: Yes  Distended abdomen. Likely mild ileus/delayed return of bowel function from hemoperitoneum. Continue to  observe.  FEN: IVF at low volume rate.  Prophylaxis: pharmacologic prophylaxis (with any of the following: enoxaparin (Lovenox) 40mg  SQ 2 hours prior to surgery then every day).  Plan: Encourage ambulation Advance to PO medication recheck CBC, will re-evaluate patient in pm for possible discharge if feeling better and hemoglobin stable.  Dispo:  Discharge plan to include :consults: @CM @, Social Work The patient is to be discharged to home.   LOS: 1 day    [name] 07/26/2017, 10:13 AM

## 2017-07-26 NOTE — Progress Notes (Signed)
Initial Nutrition Assessment    DOCUMENTATION CODES:   Underweight, Non-severe (moderate) malnutrition in context of chronic illness  INTERVENTION:  RD to order Ensure Enlive (strawberry) po BID, each supplement provides 350kcals and 20g of protein.   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   GOAL:   Patient will meet greater than or equal to 90% of their needs   MONITOR:   PO intake, Supplement acceptance, Weight trends, Skin  REASON FOR ASSESSMENT:   Malnutrition Screening Tool    ASSESSMENT:   72 y.o. F, Stage IV ovarian cancer, Neoadjuvant chemotherapy, s/p total hysterectomy, omentectomy, and radical tumor debulking. PMHx: kidney donation.   Pt reports poor appetite since yesterday afternoon after blood transfusion. Pt also reports feeling bloated and fatigued. She denies N/V as well as bowel movements but is passing gas.   Pt states recent taste changes. She reports a 16lb weight loss in the past year, which could not be confirmed in the medical record. Per recorded weights, the pt had 7% weight loss from 05/16/17, which is significant for timeframe. The pt reports weight stability over the past month which is confirmed in the medical record.   Family and pt report eating most of lunch yesterday (100% of mashed potatoes, bites of rice, and 50% of meatloaf). Recorded % of meal eaten: 50%  Pt and family report poor intake at dinner yesterday (bites of pork & applesauce and some mashed potatoes) Recorded % of meal eaten 25%.  Per pt report and visual observation, 10% of today's breakfast (cereal and banana) was consumed.  PTA pt reports a good appetite and consuming 3 regular meals a day.  Pt was amenable to trying Ensure Enlive (strawberry) to increase protein intake during admission.  Labs  reviewed.  Medications: Senokot.   NUTRITION - FOCUSED PHYSICAL EXAM:    Most Recent Value  Orbital Region  Unable to assess  Upper Arm Region  Mild depletion  Thoracic and Lumbar Region  Unable to assess  Buccal Region  Unable to assess  Temple Region  Unable to assess  Clavicle Bone Region  Mild depletion  Clavicle and Acromion Bone Region  Moderate depletion  Scapular Bone Region  Moderate depletion  Dorsal Hand  Moderate depletion  Patellar Region  Mild depletion  Anterior Thigh Region  Mild depletion  Posterior Calf Region  Mild depletion  Edema (RD Assessment)  Unable to assess       Diet Order:  Diet regular Room service appropriate? Yes; Fluid consistency: Thin  EDUCATION NEEDS:   No education needs have been identified at this time  Skin:  Skin Assessment: Skin Integrity Issues: Skin Integrity Issues:: Incisions Incisions: abdominal and vaginal  Last BM:  PTA  Height:   Ht Readings from Last 1 Encounters:  07/24/17 5\' 4"  (1.626 m)    Weight:  Wt Readings from Last 3 Encounters:  07/24/17 100 lb (45.4 kg)  07/20/17 100 lb 12.8 oz (45.7 kg)  07/11/17 102 lb 3.2 oz (46.4 kg)  06/27/17 100lb  Ideal Body Weight:  54.5 kg  BMI:  Body mass index is 17.16 kg/m.  Estimated Nutritional Needs:   Kcal:  0300-9233  Protein:  70-80  Fluid:  1.4-1.7L   Colleen Mckinney, [place], Dietetic Intern Pager # [phone]

## 2017-07-26 NOTE — Discharge Instructions (Addendum)
07/26/2017  Return to work: 4-6 weeks if applicable  Activity: 1. Be up and out of the bed during the day.  Take a nap if needed.  You may walk up steps but be careful and use the hand rail.  Stair climbing will tire you more than you think, you may need to stop part way and rest.   2. No lifting or straining for 6 weeks.  3. No driving for 1 week(s).  Do not drive if you are taking narcotic pain medicine.  4. Shower daily.  Use soap and water on your incision and pat dry; don't rub.  No tub baths until cleared by your surgeon.   5. No sexual activity and nothing in the vagina for 8 weeks.  6. You may experience a small amount of clear drainage from your incisions, which is normal.  If the drainage persists or increases, please call the office.  7. You may experience vaginal spotting after surgery or around the 6-8 week mark from surgery when the stitches at the top of the vagina begin to dissolve.  The spotting is normal but if you experience heavy bleeding, call our office.  8. Take Tylenol or ibuprofen first for pain and only use oxycodone for severe pain not relieved by the Tylenol or Ibuprofen.  Monitor your Tylenol intake to a max of 4,000 mg a day since oxycodone has Tylenol in it as well.  Diet: 1. Low sodium Heart Healthy Diet is recommended.  2. It is safe to use a laxative, such as Miralax or Colace, if you have difficulty moving your bowels. You can take Sennakot at bedtime every evening to keep bowel movements regular and to prevent constipation.    Wound Care: 1. Keep clean and dry.  Shower daily.  Reasons to call the Doctor:  Fever - Oral temperature greater than 100.4 degrees Fahrenheit  Foul-smelling vaginal discharge  Difficulty urinating  Nausea and vomiting  Increased pain at the site of the incision that is unrelieved with pain medicine.  Difficulty breathing with or without chest pain  New calf pain especially if only on one side  Sudden, continuing  increased vaginal bleeding with or without clots.   Contacts: For questions or concerns you should contact:  Dr. Everitt Amber at [phone]  Joylene John, NP at [phone]  After Hours: call [phone] and have the GYN Oncologist paged/contacted   How and Where to Give Subcutaneous Enoxaparin Injections Enoxaparin is an injectable medicine. It is used to help prevent blood clots from developing in your veins. Health care providers often use anticoagulants like enoxaparin to prevent clots following surgery. Enoxaparin is also used in combination with other medicines to treat blood clots and heart attacks. If blood clots are left untreated, they can be life threatening. Enoxaparin comes in single-use syringes. You inject enoxaparin through a syringe into your belly (abdomen). You should change the injection site each time you give yourself a shot. Continue the enoxaparin injections as directed by your health care provider. Your health care provider will use blood clotting test results to decide when you can safely stop using enoxaparin injections. If your health care provider prescribes any additional medicines, use the medicines exactly as directed. How do I inject enoxaparin? 9. Wash your hands with soap and water. 10. Clean the selected injection site as directed by your health care provider. 11. Remove the needle cap by pulling it straight off the syringe. 12. When using a prefilled syringe, do not push the air  bubble out of the syringe before the injection. The air bubble will help you get all of the medicine out of the syringe. 13. Hold the syringe like a pencil using your writing hand. 14. Use your other hand to pinch and hold an inch of the cleansed skin. 15. Insert the entire needle straight down into the fold of skin. 16. Push the plunger with your thumb until the syringe is empty. 17. Pull the needle straight out of your skin. 18. Enoxaparin injection prefilled syringes and  graduated prefilled syringes are available with a system that shields the needle after injection. After you have completed your injection and removed the needle from your skin, firmly push down on the plunger. The protective sleeve will automatically cover the needle and you will hear a click. The click means the needle is safely covered. 19. Place the syringe in the nearest needle box, also called a sharps container. If you do not have a sharps container, you can use a hard-sided plastic container with a secure lid, such as an empty laundry detergent bottle. What else do I need to know?  Do not use enoxaparin if: ? You have allergies to heparin or pork products. ? You have been diagnosed with a condition called thrombocytopenia.  Do not use the syringe or needle more than one time.  Use medicines only as directed by your health care provider.  Changes in medicines, supplements, diet, and illness can affect your anticoagulation therapy. Be sure to inform your health care provider of any of these changes.  It is important that you tell all of your health care providers and your dentist that you are taking an anticoagulant, especially if you are injured or plan to have any type of procedure.  While on anticoagulants, you will need to have blood tests done routinely as directed by your health care provider.  While using this medicine, avoid physical activities or sports that could result in a fall or cause injury.  Follow up with your laboratory test and health care provider appointments as directed. It is very important to keep your appointments. Not keeping appointments could result in a chronic or permanent injury, pain, or disability.  Before giving your medicine, you should make sure the injection is a clear and colorless or pale yellow solution. If your medicine becomes discolored or if there are particles in the syringe, do not use it and notify your health care provider.  Keep your  medicine safely stored at room temperature. Contact a health care provider if:  You develop any rashes on your skin.  You have large areas of bruising on your skin.  You have any worsening of the condition for which you take Enoxaparin.  You develop a fever. Get help right away if:  You develop bleeding problems such as: ? Bleeding from the gums or nose that does not stop quickly. ? Vomiting blood or coughing up blood. ? Blood in your urine. ? Blood in your stool, or stool that has a dark, tarry, or coffee grounds appearance. ? A cut that does not stop bleeding within 10 minutes. These symptoms may represent a serious problem that is an emergency. Do not wait to see if the symptoms will go away. Get medical help right away. Call your local emergency services (911 in the [place] Do not drive yourself to the hospital. This information is not intended to replace advice given to you by your health care provider. Make sure you discuss any questions you have  with your health care provider. Document Released: 04/20/2004 Document Revised: 02/24/2016 Document Reviewed: 12/04/2013 Elsevier Interactive Patient Education  2018 [employer].

## 2017-07-26 NOTE — Progress Notes (Signed)
Patient's family reported that patient's jaw and eyes were swollen, patient was assessed, crepitus felt and heard from left and right chest, both side of face and jaw, right arm and abdomen, no wheezing or crackles heard, patient denied chest pain, coughing and difficulty breathing, respirations 16, O2 saturation 98% on room air, temp. 98.1 BP 117/60, pulse 77, Dr. Denman George was paged and notified, no new order received, will continue to monitor.

## 2017-07-27 ENCOUNTER — Ambulatory Visit: Payer: Medicare Other | Admitting: Gynecologic Oncology

## 2017-07-29 ENCOUNTER — Emergency Department (HOSPITAL_COMMUNITY): Payer: Medicare Other

## 2017-07-29 ENCOUNTER — Other Ambulatory Visit: Payer: Self-pay

## 2017-07-29 ENCOUNTER — Inpatient Hospital Stay (HOSPITAL_COMMUNITY)
Admission: EM | Admit: 2017-07-29 | Discharge: 2017-08-02 | DRG: 863 | Disposition: A | Discharge status: Home / Self Care | Payer: Medicare Other | Attending: Gynecologic Oncology | Admitting: Gynecologic Oncology

## 2017-07-29 ENCOUNTER — Encounter (HOSPITAL_COMMUNITY): Payer: Self-pay | Admitting: *Deleted

## 2017-07-29 DIAGNOSIS — G620 Drug-induced polyneuropathy: Secondary | ICD-10-CM | POA: Diagnosis present

## 2017-07-29 DIAGNOSIS — S3720XA Unspecified injury of bladder, initial encounter: Secondary | ICD-10-CM

## 2017-07-29 DIAGNOSIS — D62 Acute posthemorrhagic anemia: Secondary | ICD-10-CM | POA: Diagnosis present

## 2017-07-29 DIAGNOSIS — N739 Female pelvic inflammatory disease, unspecified: Secondary | ICD-10-CM

## 2017-07-29 DIAGNOSIS — Z8249 Family history of ischemic heart disease and other diseases of the circulatory system: Secondary | ICD-10-CM | POA: Diagnosis not present

## 2017-07-29 DIAGNOSIS — N99840 Postprocedural hematoma of a genitourinary system organ or structure following a genitourinary system procedure: Secondary | ICD-10-CM | POA: Diagnosis not present

## 2017-07-29 DIAGNOSIS — R310 Gross hematuria: Secondary | ICD-10-CM | POA: Diagnosis not present

## 2017-07-29 DIAGNOSIS — Z800 Family history of malignant neoplasm of digestive organs: Secondary | ICD-10-CM | POA: Diagnosis not present

## 2017-07-29 DIAGNOSIS — R5082 Postprocedural fever: Secondary | ICD-10-CM | POA: Diagnosis present

## 2017-07-29 DIAGNOSIS — E876 Hypokalemia: Secondary | ICD-10-CM | POA: Diagnosis present

## 2017-07-29 DIAGNOSIS — Z90710 Acquired absence of both cervix and uterus: Secondary | ICD-10-CM | POA: Diagnosis not present

## 2017-07-29 DIAGNOSIS — R509 Fever, unspecified: Secondary | ICD-10-CM | POA: Diagnosis present

## 2017-07-29 DIAGNOSIS — Z8349 Family history of other endocrine, nutritional and metabolic diseases: Secondary | ICD-10-CM | POA: Diagnosis not present

## 2017-07-29 DIAGNOSIS — M810 Age-related osteoporosis without current pathological fracture: Secondary | ICD-10-CM | POA: Diagnosis present

## 2017-07-29 DIAGNOSIS — T451X5A Adverse effect of antineoplastic and immunosuppressive drugs, initial encounter: Secondary | ICD-10-CM | POA: Diagnosis present

## 2017-07-29 DIAGNOSIS — C569 Malignant neoplasm of unspecified ovary: Secondary | ICD-10-CM | POA: Diagnosis present

## 2017-07-29 DIAGNOSIS — R000 Tachycardia, unspecified: Secondary | ICD-10-CM | POA: Diagnosis present

## 2017-07-29 DIAGNOSIS — N9972 Accidental puncture and laceration of a genitourinary system organ or structure during other procedure: Secondary | ICD-10-CM | POA: Diagnosis not present

## 2017-07-29 DIAGNOSIS — T8143XA Infection following a procedure, organ and space surgical site, initial encounter: Secondary | ICD-10-CM | POA: Diagnosis present

## 2017-07-29 DIAGNOSIS — D6481 Anemia due to antineoplastic chemotherapy: Secondary | ICD-10-CM | POA: Diagnosis present

## 2017-07-29 LAB — COMPREHENSIVE METABOLIC PANEL
ALK PHOS: 50 U/L (ref 38–126)
ALT: 26 U/L (ref 14–54)
ANION GAP: 9 (ref 5–15)
AST: 36 U/L (ref 15–41)
Albumin: 3.3 g/dL — ABNORMAL LOW (ref 3.5–5.0)
BILIRUBIN TOTAL: 1.3 mg/dL — AB (ref 0.3–1.2)
BUN: 12 mg/dL (ref 6–20)
CALCIUM: 8.7 mg/dL — AB (ref 8.9–10.3)
CO2: 27 mmol/L (ref 22–32)
CREATININE: 0.71 mg/dL (ref 0.44–1.00)
Chloride: 102 mmol/L (ref 101–111)
GFR calc Af Amer: >60 mL/min (ref >60)
GFR calc non Af Amer: >60 mL/min (ref >60)
GLUCOSE: 104 mg/dL — AB (ref 65–99)
Potassium: 3.2 mmol/L — ABNORMAL LOW (ref 3.5–5.1)
Sodium: 138 mmol/L (ref 135–145)
TOTAL PROTEIN: 6.2 g/dL — AB (ref 6.5–8.1)

## 2017-07-29 LAB — URINALYSIS, ROUTINE W REFLEX MICROSCOPIC
BACTERIA UA: NONE SEEN
Bilirubin Urine: NEGATIVE
Glucose, UA: NEGATIVE mg/dL
KETONES UR: 80 mg/dL — AB
Leukocytes, UA: NEGATIVE
Nitrite: NEGATIVE
PH: 5 (ref 5.0–8.0)
Protein, ur: 30 mg/dL — AB
SPECIFIC GRAVITY, URINE: 1.021 (ref 1.005–1.030)

## 2017-07-29 LAB — CBC WITH DIFFERENTIAL/PLATELET
BASOS PCT: 0 %
Basophils Absolute: 0 10*3/uL (ref 0.0–0.1)
EOS ABS: 0.1 10*3/uL (ref 0.0–0.7)
EOS PCT: 0 %
HCT: 25.9 % — ABNORMAL LOW (ref 36.0–46.0)
Hemoglobin: 8.9 g/dL — ABNORMAL LOW (ref 12.0–15.0)
Lymphocytes Relative: 2 %
Lymphs Abs: 0.3 10*3/uL — ABNORMAL LOW (ref 0.7–4.0)
MCH: 31.7 pg (ref 26.0–34.0)
MCHC: 34.4 g/dL (ref 30.0–36.0)
MCV: 92.2 fL (ref 78.0–100.0)
MONO ABS: 0.9 10*3/uL (ref 0.1–1.0)
Monocytes Relative: 5 %
NEUTROS PCT: 93 %
Neutro Abs: 16.7 10*3/uL — ABNORMAL HIGH (ref 1.7–7.7)
PLATELETS: 300 10*3/uL (ref 150–400)
RBC: 2.81 MIL/uL — ABNORMAL LOW (ref 3.87–5.11)
RDW: 16.3 % — AB (ref 11.5–15.5)
WBC: 18 10*3/uL — ABNORMAL HIGH (ref 4.0–10.5)

## 2017-07-29 LAB — PROTIME-INR
INR: 1.07
PROTHROMBIN TIME: 13.8 s (ref 11.4–15.2)

## 2017-07-29 LAB — I-STAT CG4 LACTIC ACID, ED
LACTIC ACID, VENOUS: 0.35 mmol/L — AB (ref 0.5–1.9)
Lactic Acid, Venous: 0.82 mmol/L (ref 0.5–1.9)

## 2017-07-29 MED ORDER — OXYCODONE HCL 5 MG PO TABS: 5.0000 mg | ORAL_TABLET | ORAL | Status: DC | PRN

## 2017-07-29 MED ORDER — ONDANSETRON HCL 4 MG/2ML IJ SOLN: 4.0000 mg | Freq: Four times a day (QID) | INTRAMUSCULAR | Status: DC | PRN

## 2017-07-29 MED ORDER — KCL IN DEXTROSE-NACL 20-5-0.45 MEQ/L-%-% IV SOLN
INTRAVENOUS | Status: DC
  Administered 2017-07-29 – 2017-07-31 (×3): via INTRAVENOUS
  Administered 2017-08-01: 1000 mL via INTRAVENOUS
  Filled 2017-07-29 (×4): qty 1000

## 2017-07-29 MED ORDER — HYDROMORPHONE HCL 1 MG/ML IJ SOLN: 0.5000 mg | INTRAMUSCULAR | Status: DC | PRN

## 2017-07-29 MED ORDER — ACETAMINOPHEN 325 MG PO TABS
650.0000 mg | ORAL_TABLET | Freq: Four times a day (QID) | ORAL | Status: DC | PRN
  Administered 2017-07-30 (×2): 650 mg via ORAL
  Filled 2017-07-29 (×2): qty 2

## 2017-07-29 MED ORDER — IOPAMIDOL (ISOVUE-300) INJECTION 61%
INTRAVENOUS | Status: AC
  Filled 2017-07-29: qty 30

## 2017-07-29 MED ORDER — SODIUM CHLORIDE 0.9 % IV SOLN
INTRAVENOUS | Status: DC
  Administered 2017-07-29: 18:00:00 via INTRAVENOUS

## 2017-07-29 MED ORDER — SIMETHICONE 80 MG PO CHEW: 80.0000 mg | CHEWABLE_TABLET | Freq: Four times a day (QID) | ORAL | Status: DC | PRN

## 2017-07-29 MED ORDER — ACETAMINOPHEN 500 MG PO TABS
1000.0000 mg | ORAL_TABLET | Freq: Once | ORAL | Status: AC
  Administered 2017-07-29: 1000 mg via ORAL
  Filled 2017-07-29: qty 2

## 2017-07-29 MED ORDER — ENSURE ENLIVE PO LIQD: 237.0000 mL | Freq: Two times a day (BID) | ORAL | Status: DC

## 2017-07-29 MED ORDER — PIPERACILLIN-TAZOBACTAM 3.375 G IVPB 30 MIN: 3.3750 g | Freq: Four times a day (QID) | INTRAVENOUS | Status: DC

## 2017-07-29 MED ORDER — ONDANSETRON HCL 4 MG PO TABS: 4.0000 mg | ORAL_TABLET | Freq: Four times a day (QID) | ORAL | Status: DC | PRN

## 2017-07-29 MED ORDER — ENOXAPARIN SODIUM 40 MG/0.4ML ~~LOC~~ SOLN: 40.0000 mg | SUBCUTANEOUS | Status: DC

## 2017-07-29 MED ORDER — POTASSIUM CHLORIDE CRYS ER 20 MEQ PO TBCR
20.0000 meq | EXTENDED_RELEASE_TABLET | Freq: Two times a day (BID) | ORAL | Status: DC
  Administered 2017-07-29: 20 meq via ORAL
  Filled 2017-07-29: qty 1

## 2017-07-29 MED ORDER — ONDANSETRON HCL 4 MG/2ML IJ SOLN: 4.0000 mg | Freq: Three times a day (TID) | INTRAMUSCULAR | Status: DC | PRN

## 2017-07-29 MED ORDER — SENNA 8.6 MG PO TABS
1.0000 | ORAL_TABLET | Freq: Two times a day (BID) | ORAL | Status: DC
  Administered 2017-07-29: 8.6 mg via ORAL
  Filled 2017-07-29: qty 1

## 2017-07-29 MED ORDER — PIPERACILLIN-TAZOBACTAM 3.375 G IVPB
3.3750 g | Freq: Three times a day (TID) | INTRAVENOUS | Status: DC
  Administered 2017-07-30 – 2017-08-01 (×10): 3.375 g via INTRAVENOUS
  Filled 2017-07-29 (×10): qty 50

## 2017-07-29 MED ORDER — PIPERACILLIN-TAZOBACTAM 3.375 G IVPB 30 MIN
3.3750 g | Freq: Once | INTRAVENOUS | Status: AC
  Administered 2017-07-29: 3.375 g via INTRAVENOUS
  Filled 2017-07-29: qty 50

## 2017-07-29 NOTE — ED Notes (Signed)
ED TO INPATIENT HANDOFF REPORT  Name/Age/Gender Colleen Mckinney 72 y.o. female  Code Status Code Status History    Date Active Date Inactive Code Status Order ID Comments User Context   07/24/2017 18:46 07/26/2017 20:21 Full Code [id number]  Lahoma Crocker, MD Inpatient      Home/SNF/Other Home  Chief Complaint post surgical fever  Level of Care/Admitting Diagnosis ED Disposition    ED Disposition Condition [place]: [facility] [[id number]]  Level of Care: Med-Surg [16]  Diagnosis: Fever [335456]  Admitting Physician: [place], [place]  Attending Physician: Lahoma Crocker [[id number]]  Estimated length of stay: past midnight tomorrow  Certification:: I certify this patient will need inpatient services for at least 2 midnights  PT Class (Do Not Modify): Inpatient [101]  PT Acc Code (Do Not Modify): Private [1]       Medical History Past Medical History:  Diagnosis Date  . Anxiety    claustrophobia, fear of heights  . Cancer ([place])    ovarian cancer   . Complication of anesthesia    remembers severe sore throat after kidney surgery and feeling dizzy  . 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 Left shoulder pain   . Osteoporosis   . Premature atrial complexes    see EKG in epic   . Skin lesion    "pre-cancer" -past hx.    Allergies No Known Allergies  IV Location/Drains/Wounds Patient Lines/Drains/Airways Status   Active Line/Drains/Airways    Name:   Placement date:   Placement time:   Site:   Days:   Implanted Port Right Chest   -    -    Chest      Incision (Closed) 07/24/17 Abdomen   07/24/17    1633     5   Incision (Closed) 07/24/17 Vagina   07/24/17    1633     5   Incision - 5 Ports Abdomen 1: Right;Lateral 2: Umbilicus 3: Left;Lateral;Upper 4: Left;Lateral;Mid 5: Left;Lateral;Lower   07/24/17    1340     5          Labs/Imaging Results for orders placed or performed during the hospital  encounter of 07/29/17 (from the past 48 hour(s))  Comprehensive metabolic panel     Status: Abnormal   Collection Time: 07/29/17 12:59 PM  Result Value Ref Range   Sodium 138 135 - 145 mmol/L   Potassium 3.2 (L) 3.5 - 5.1 mmol/L   Chloride 102 101 - 111 mmol/L   CO2 27 22 - 32 mmol/L   Glucose, Bld 104 (H) 65 - 99 mg/dL   BUN 12 6 - 20 mg/dL   Creatinine, Ser 0.71 0.44 - 1.00 mg/dL   Calcium 8.7 (L) 8.9 - 10.3 mg/dL   Total Protein 6.2 (L) 6.5 - 8.1 g/dL   Albumin 3.3 (L) 3.5 - 5.0 g/dL   AST 36 15 - 41 U/L   ALT 26 14 - 54 U/L   Alkaline Phosphatase 50 38 - 126 U/L   Total Bilirubin 1.3 (H) 0.3 - 1.2 mg/dL   GFR calc non Af Amer >60 >60 mL/min   GFR calc Af Amer >60 >60 mL/min    Comment: (NOTE) The eGFR has been calculated using the CKD EPI equation. This calculation has not been validated in all clinical situations. eGFR's persistently <60 mL/min signify possible Chronic Kidney Disease.    Anion gap 9 5 - 15  CBC with Differential     Status: Abnormal   Collection Time: 07/29/17 12:59 PM  Result Value Ref Range   WBC 18.0 (H) 4.0 - 10.5 K/uL   RBC 2.81 (L) 3.87 - 5.11 MIL/uL   Hemoglobin 8.9 (L) 12.0 - 15.0 g/dL   HCT 25.9 (L) 36.0 - 46.0 %   MCV 92.2 78.0 - 100.0 fL   MCH 31.7 26.0 - 34.0 pg   MCHC 34.4 30.0 - 36.0 g/dL   RDW 16.3 (H) 11.5 - 15.5 %   Platelets 300 150 - 400 K/uL   Neutrophils Relative % 93 %   Neutro Abs 16.7 (H) 1.7 - 7.7 K/uL   Lymphocytes Relative 2 %   Lymphs Abs 0.3 (L) 0.7 - 4.0 K/uL   Monocytes Relative 5 %   Monocytes Absolute 0.9 0.1 - 1.0 K/uL   Eosinophils Relative 0 %   Eosinophils Absolute 0.1 0.0 - 0.7 K/uL   Basophils Relative 0 %   Basophils Absolute 0.0 0.0 - 0.1 K/uL  Protime-INR     Status: None   Collection Time: 07/29/17 12:59 PM  Result Value Ref Range   Prothrombin Time 13.8 11.4 - 15.2 seconds   INR 1.07   I-Stat CG4 Lactic Acid, ED     Status: None   Collection Time: 07/29/17  1:12 PM  Result Value Ref Range    Lactic Acid, Venous 0.82 0.5 - 1.9 mmol/L  Urinalysis, Routine w reflex microscopic     Status: Abnormal   Collection Time: 07/29/17  2:50 PM  Result Value Ref Range   Color, Urine YELLOW YELLOW   APPearance CLEAR CLEAR   Specific Gravity, Urine 1.021 1.005 - 1.030   pH 5.0 5.0 - 8.0   Glucose, UA NEGATIVE NEGATIVE mg/dL   Hgb urine dipstick SMALL (A) NEGATIVE   Bilirubin Urine NEGATIVE NEGATIVE   Ketones, ur 80 (A) NEGATIVE mg/dL   Protein, ur 30 (A) NEGATIVE mg/dL   Nitrite NEGATIVE NEGATIVE   Leukocytes, UA NEGATIVE NEGATIVE   RBC / HPF 0-5 0 - 5 RBC/hpf   WBC, UA 0-5 0 - 5 WBC/hpf   Bacteria, UA NONE SEEN NONE SEEN   Squamous Epithelial / LPF 0-5 (A) NONE SEEN   Mucus PRESENT   I-Stat CG4 Lactic Acid, ED     Status: Abnormal   Collection Time: 07/29/17  3:07 PM  Result Value Ref Range   Lactic Acid, Venous 0.35 (L) 0.5 - 1.9 mmol/L   Ct Abdomen Pelvis Wo Contrast  Result Date: 07/29/2017 CLINICAL DATA:  Abdominal distention. Fever. Hysterectomy on Tuesday. Ovarian cancer. EXAM: CT ABDOMEN AND PELVIS WITHOUT CONTRAST TECHNIQUE: Multidetector CT imaging of the abdomen and pelvis was performed following the standard protocol without IV contrast. COMPARISON:  07/09/2017 FINDINGS: Lower chest: Extensive chest wall emphysema. Crepitus was noted since surgery in this patient with minimal invasive procedure. Porta catheter is seen at the upper cavoatrial junction. Hepatobiliary: No focal liver abnormality.No evidence of biliary obstruction or stone. Pancreas: Unremarkable. Spleen: Unremarkable. Adrenals/Urinary Tract: Negative adrenals. Right nephrectomy. Negative left kidney when accounting for renal sinus cysts. Gas in the urinary bladder, nonspecific. Stomach/Bowel: Oral contrast reached the proximal colon. No leak is suspected. There is mild wall thickening of bowel loops in the right lower quadrant, likely secondary given proximity to the pelvic clot. Negative for ileus or obstruction.  Vascular/Lymphatic: Pelvic lymphadenectomy. Atherosclerotic calcification. Reproductive:Hysterectomy. There is a large amount of micro bubbles in the pelvis admixed with intermediate and high-density material, correlating  with surgical note of large volume of hemostatic material usage. Small volume hemorrhage tracks into the low left paracolic gutter. No drainable fluid collection is seen. High-density posteriorly along this collection is has the same morphology is calcifications seen along the posterior pelvic mass on the preoperative study. The fat in the pelvis is stranded. Other: There is peritoneal nodularity on preceding abdominal CT, not well seen on this noncontrast study. Musculoskeletal: Anasarca and widespread subcutaneous gas. There are small high-density subcutaneous and intramuscular nodular areas along the bilateral flanks consistent with hemorrhage at the port sites. These measure up to 2 cm. IMPRESSION: 1. Blood products and large volume hemostatic material in the pelvis correlating with operative history. Sterility is indeterminate by imaging. No organized collection for drainage. 2. High-density posterior to the pelvic collection was present preoperatively and is attributed to mass calcification. No bowel leak is seen with oral contrast reaching the transverse colon. 3. Diffuse postoperative subcutaneous gas. Small hematomas at bilateral abdominal wall port sites. 4. Right nephrectomy. Electronically Signed   By: Monte Fantasia M.D.   On: 07/29/2017 15:50   Dg Chest 2 View  Result Date: 07/29/2017 CLINICAL DATA:  Sepsis and fever. Postoperative day 5 from total abdominal hysterectomy and tumor debulking. EXAM: CHEST  2 VIEW COMPARISON:  CT chest 07/09/2017. FINDINGS: Lungs are hyperexpanded. The lungs are clear without focal pneumonia, edema, pneumothorax or pleural effusion. The cardiopericardial silhouette is within normal limits for size. The visualized bony structures of the thorax are  intact. There is prominent subcutaneous emphysema throughout the anterior chest wall bilaterally. Scapula project over the lung apices bilaterally, but no definite pneumothorax can be identified. Right Port-A-Cath tip overlies the distal SVC. IMPRESSION: 1. Extensive subcutaneous emphysema involving the anterior chest wall bilaterally extending up into the neck. No pneumothorax can be identified on the current study. Crepitus in the chest was documented postoperatively in EPIC notes dated 07/25/2017. 2. Otherwise no acute cardiopulmonary findings. Electronically Signed   By: Misty Stanley M.D.   On: 07/29/2017 13:06    Pending Labs Unresulted Labs (From admission, onward)   Start     Ordered   07/29/17 1237  Culture, blood (Routine x 2)  BLOOD CULTURE X 2,   STAT     07/29/17 1236      Vitals/Pain Today's Vitals   07/29/17 1430 07/29/17 1552 07/29/17 1630 07/29/17 1800  BP: (!) 145/75 (!) 144/66 134/67 126/61  Pulse: (!) 119 (!) 119 (!) 116 (!) 105  Resp: (!) 32 18 20 (!) 25  Temp:  99.9 F (37.7 C)    TempSrc:  Oral    SpO2: 99% 100% 98% 98%  Weight:      Height:      PainSc:        Isolation Precautions No active isolations  Medications Medications  iopamidol (ISOVUE-300) 61 % injection (not administered)  piperacillin-tazobactam (ZOSYN) IVPB 3.375 g (not administered)  0.9 %  sodium chloride infusion (not administered)  ondansetron (ZOFRAN) injection 4 mg (not administered)  HYDROmorphone (DILAUDID) injection 0.5 mg (not administered)  acetaminophen (TYLENOL) tablet 1,000 mg (1,000 mg Oral Given 07/29/17 1640)    Mobility ambulatory

## 2017-07-29 NOTE — Progress Notes (Signed)
Call made to Dr. Delsa Sale; no answer; left message to return call to unit.  Reported to on-coming nurse

## 2017-07-29 NOTE — ED Notes (Signed)
ED Provider at bedside. 

## 2017-07-29 NOTE — H&P (Signed)
Colleen Mckinney is an 72 y.o. female.   Chief Complaint: Malaise, fever postop HPI: She is 5 days s/p a roboti-assisted radical tumor debulking for a stage IV ovarian cancer.  The procedure was complicated by an intraoperative coagulopathy.  Multiple doses of topical hemostatic agents were applied intraoperatively.  She received blood transfusions perioperatively.  She was felt to have a mild ileus that was improving prior to discharge 2/2 the hemoperitoneum.  The patient reports feeling "lousy" earlier today.  She also had a fever--101. 2.  She is tolerating a regular diet.  She reports normal bowel movements.  She denies significant pain or dysuria.    Past Medical History:  Diagnosis Date  . Anxiety    claustrophobia, fear of heights  . Cancer ([name])    ovarian cancer   . Complication of anesthesia    remembers severe sore throat after kidney surgery and feeling dizzy  . 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 Left shoulder pain   . Osteoporosis   . Premature atrial complexes    see EKG in epic   . Skin lesion    "pre-cancer" -past hx.    Past Surgical History:  Procedure Laterality Date  . ABDOMINAL HYSTERECTOMY    . BREAST SURGERY Right    lumpectomy-benign  . CERVIX SURGERY     "freezing" dysplasia  . COLONOSCOPY    . DEBULKING N/A 07/24/2017   Procedure: RADICAL TUMOR DEBULKING;  Surgeon: Everitt Amber, MD;  Location: [facility];  Service: Gynecology;  Laterality: N/A;  . HYSTEROSCOPY    . IR FLUORO GUIDE PORT INSERTION RIGHT  05/15/2017  . IR US GUIDE VASC ACCESS RIGHT  05/15/2017  . NEPHRECTOMY  01/1998   right--donation  . OMENTECTOMY N/A 07/24/2017   Procedure: OMENTECTOMY;  Surgeon: Everitt Amber, MD;  Location: [facility];  Service: Gynecology;  Laterality: N/A;  . ROBOTIC ASSISTED TOTAL HYSTERECTOMY WITH BILATERAL SALPINGO OOPHERECTOMY N/A 07/24/2017   Procedure: XI ROBOTIC ASSISTED TOTAL HYSTERECTOMY WITH BILATERAL SALPINGO OOPHORECTOMY;  Surgeon: Everitt Amber, MD;   Location: [facility];  Service: Gynecology;  Laterality: N/A;  . SHOULDER ARTHROSCOPY Left    "impingement" "frozen shoulder"  . SHOULDER ARTHROSCOPY WITH SUBACROMIAL DECOMPRESSION Right 10/23/2013   Procedure: RIGHT SHOULDER ARTHROSCOPY WITH SUBACROMIAL DECOMPRESSION, EVULATION UNDER ANESTHESIA  AND MANIPULATION UNDER ANESTHESIA;  Surgeon: Johnn Hai, MD;  Location: [facility];  Service: Orthopedics;  Laterality: Right;    Family History  Problem Relation Age of Onset  . Heart disease Mother        mitral valve prolapse  . Hypertension Mother   . Hyperlipidemia Mother   . Cancer Mother 17       breast  . Osteopenia Mother   . Heart disease Father        bypass  . Hypertension Father   . Hyperlipidemia Father   . Cancer Father 59       bladder,  prostate  . Hyperlipidemia Sister   . Colon cancer Maternal Grandfather    Social History:  reports that  has never smoked. she has never used smokeless tobacco. She reports that she drinks about 0.6 oz of alcohol per week. She reports that she does not use drugs.  Allergies: No Known Allergies  Medications Prior to Admission  Medication Sig Dispense Refill  . acetaminophen (TYLENOL) 500 MG tablet Take 500 mg by mouth every 6 (six) hours as needed for mild pain or moderate pain.     Marland Kitchen  dexamethasone (DECADRON) 4 MG tablet Take 5 tabs the evening before chemo and 5 tabs the morning of chemo, every 21 days by mouth with pills 30 tablet 1  . enoxaparin (LOVENOX) 40 MG/0.4ML injection Inject 0.4 mLs (40 mg total) into the skin daily. 13 Syringe 0  . lidocaine-prilocaine (EMLA) cream Apply to affected area once (Patient taking differently: Apply 1 application topically as needed. Apply to affected area once) 30 g 3  . ondansetron (ZOFRAN) 8 MG tablet Take 1 tablet (8 mg total) by mouth every 8 (eight) hours as needed for refractory nausea / vomiting. Start on day 3 after chemo. 30 tablet 1  . oxyCODONE (OXY IR/ROXICODONE) 5 MG immediate release  tablet Take 1 tablet (5 mg total) by mouth every 4 (four) hours as needed for severe pain. 60 tablet 0  . prochlorperazine (COMPAZINE) 10 MG tablet Take 1 tablet (10 mg total) every 6 (six) hours as needed by mouth (Nausea or vomiting). 30 tablet 1  . Calcium Carbonate-Vitamin D (CALCIUM + D PO) Take 1 tablet by mouth daily.       Results for orders placed or performed during the hospital encounter of 07/29/17 (from the past 48 hour(s))  Comprehensive metabolic panel     Status: Abnormal   Collection Time: 07/29/17 12:59 PM  Result Value Ref Range   Sodium 138 135 - 145 mmol/L   Potassium 3.2 (L) 3.5 - 5.1 mmol/L   Chloride 102 101 - 111 mmol/L   CO2 27 22 - 32 mmol/L   Glucose, Bld 104 (H) 65 - 99 mg/dL   BUN 12 6 - 20 mg/dL   Creatinine, Ser 0.71 0.44 - 1.00 mg/dL   Calcium 8.7 (L) 8.9 - 10.3 mg/dL   Total Protein 6.2 (L) 6.5 - 8.1 g/dL   Albumin 3.3 (L) 3.5 - 5.0 g/dL   AST 36 15 - 41 U/L   ALT 26 14 - 54 U/L   Alkaline Phosphatase 50 38 - 126 U/L   Total Bilirubin 1.3 (H) 0.3 - 1.2 mg/dL   GFR calc non Af Amer >60 >60 mL/min   GFR calc Af Amer >60 >60 mL/min    Comment: (NOTE) The eGFR has been calculated using the CKD EPI equation. This calculation has not been validated in all clinical situations. eGFR's persistently <60 mL/min signify possible Chronic Kidney Disease.    Anion gap 9 5 - 15  CBC with Differential     Status: Abnormal   Collection Time: 07/29/17 12:59 PM  Result Value Ref Range   WBC 18.0 (H) 4.0 - 10.5 K/uL   RBC 2.81 (L) 3.87 - 5.11 MIL/uL   Hemoglobin 8.9 (L) 12.0 - 15.0 g/dL   HCT 25.9 (L) 36.0 - 46.0 %   MCV 92.2 78.0 - 100.0 fL   MCH 31.7 26.0 - 34.0 pg   MCHC 34.4 30.0 - 36.0 g/dL   RDW 16.3 (H) 11.5 - 15.5 %   Platelets 300 150 - 400 K/uL   Neutrophils Relative % 93 %   Neutro Abs 16.7 (H) 1.7 - 7.7 K/uL   Lymphocytes Relative 2 %   Lymphs Abs 0.3 (L) 0.7 - 4.0 K/uL   Monocytes Relative 5 %   Monocytes Absolute 0.9 0.1 - 1.0 K/uL    Eosinophils Relative 0 %   Eosinophils Absolute 0.1 0.0 - 0.7 K/uL   Basophils Relative 0 %   Basophils Absolute 0.0 0.0 - 0.1 K/uL  Protime-INR     Status: None  Collection Time: 07/29/17 12:59 PM  Result Value Ref Range   Prothrombin Time 13.8 11.4 - 15.2 seconds   INR 1.07   I-Stat CG4 Lactic Acid, ED     Status: None   Collection Time: 07/29/17  1:12 PM  Result Value Ref Range   Lactic Acid, Venous 0.82 0.5 - 1.9 mmol/L  Urinalysis, Routine w reflex microscopic     Status: Abnormal   Collection Time: 07/29/17  2:50 PM  Result Value Ref Range   Color, Urine YELLOW YELLOW   APPearance CLEAR CLEAR   Specific Gravity, Urine 1.021 1.005 - 1.030   pH 5.0 5.0 - 8.0   Glucose, UA NEGATIVE NEGATIVE mg/dL   Hgb urine dipstick SMALL (A) NEGATIVE   Bilirubin Urine NEGATIVE NEGATIVE   Ketones, ur 80 (A) NEGATIVE mg/dL   Protein, ur 30 (A) NEGATIVE mg/dL   Nitrite NEGATIVE NEGATIVE   Leukocytes, UA NEGATIVE NEGATIVE   RBC / HPF 0-5 0 - 5 RBC/hpf   WBC, UA 0-5 0 - 5 WBC/hpf   Bacteria, UA NONE SEEN NONE SEEN   Squamous Epithelial / LPF 0-5 (A) NONE SEEN   Mucus PRESENT   I-Stat CG4 Lactic Acid, ED     Status: Abnormal   Collection Time: 07/29/17  3:07 PM  Result Value Ref Range   Lactic Acid, Venous 0.35 (L) 0.5 - 1.9 mmol/L   Ct Abdomen Pelvis Wo Contrast  Result Date: 07/29/2017 CLINICAL DATA:  Abdominal distention. Fever. Hysterectomy on Tuesday. Ovarian cancer. EXAM: CT ABDOMEN AND PELVIS WITHOUT CONTRAST TECHNIQUE: Multidetector CT imaging of the abdomen and pelvis was performed following the standard protocol without IV contrast. COMPARISON:  07/09/2017 FINDINGS: Lower chest: Extensive chest wall emphysema. Crepitus was noted since surgery in this patient with minimal invasive procedure. Porta catheter is seen at the upper cavoatrial junction. Hepatobiliary: No focal liver abnormality.No evidence of biliary obstruction or stone. Pancreas: Unremarkable. Spleen: Unremarkable.  Adrenals/Urinary Tract: Negative adrenals. Right nephrectomy. Negative left kidney when accounting for renal sinus cysts. Gas in the urinary bladder, nonspecific. Stomach/Bowel: Oral contrast reached the proximal colon. No leak is suspected. There is mild wall thickening of bowel loops in the right lower quadrant, likely secondary given proximity to the pelvic clot. Negative for ileus or obstruction. Vascular/Lymphatic: Pelvic lymphadenectomy. Atherosclerotic calcification. Reproductive:Hysterectomy. There is a large amount of micro bubbles in the pelvis admixed with intermediate and high-density material, correlating with surgical note of large volume of hemostatic material usage. Small volume hemorrhage tracks into the low left paracolic gutter. No drainable fluid collection is seen. High-density posteriorly along this collection is has the same morphology is calcifications seen along the posterior pelvic mass on the preoperative study. The fat in the pelvis is stranded. Other: There is peritoneal nodularity on preceding abdominal CT, not well seen on this noncontrast study. Musculoskeletal: Anasarca and widespread subcutaneous gas. There are small high-density subcutaneous and intramuscular nodular areas along the bilateral flanks consistent with hemorrhage at the port sites. These measure up to 2 cm. IMPRESSION: 1. Blood products and large volume hemostatic material in the pelvis correlating with operative history. Sterility is indeterminate by imaging. No organized collection for drainage. 2. High-density posterior to the pelvic collection was present preoperatively and is attributed to mass calcification. No bowel leak is seen with oral contrast reaching the transverse colon. 3. Diffuse postoperative subcutaneous gas. Small hematomas at bilateral abdominal wall port sites. 4. Right nephrectomy. Electronically Signed   By: Monte Fantasia M.D.   On: 07/29/2017 15:50  Dg Chest 2 View  Result Date:  07/29/2017 CLINICAL DATA:  Sepsis and fever. Postoperative day 5 from total abdominal hysterectomy and tumor debulking. EXAM: CHEST  2 VIEW COMPARISON:  CT chest 07/09/2017. FINDINGS: Lungs are hyperexpanded. The lungs are clear without focal pneumonia, edema, pneumothorax or pleural effusion. The cardiopericardial silhouette is within normal limits for size. The visualized bony structures of the thorax are intact. There is prominent subcutaneous emphysema throughout the anterior chest wall bilaterally. Scapula project over the lung apices bilaterally, but no definite pneumothorax can be identified. Right Port-A-Cath tip overlies the distal SVC. IMPRESSION: 1. Extensive subcutaneous emphysema involving the anterior chest wall bilaterally extending up into the neck. No pneumothorax can be identified on the current study. Crepitus in the chest was documented postoperatively in EPIC notes dated 07/25/2017. 2. Otherwise no acute cardiopulmonary findings. Electronically Signed   By: Misty Stanley M.D.   On: 07/29/2017 13:06    Review of Systems  Constitutional: Positive for fever and malaise/fatigue.  Respiratory: Negative for cough and shortness of breath.   Cardiovascular: Negative for chest pain and leg swelling.  Gastrointestinal: Negative for abdominal pain, constipation, diarrhea, nausea and vomiting.  Genitourinary: Negative for dysuria, frequency and urgency.    Blood pressure 107/62, pulse (!) 113, temperature 98.6 F (37 C), temperature source Oral, resp. rate 20, height 5' 4"  (1.626 m), weight 102 lb (46.3 kg), SpO2 99 %. Physical Exam  Constitutional: She is oriented to person, place, and time.  Thin, non-toxic appearing  HENT:  Head: Normocephalic and atraumatic.  Eyes: Pupils are equal, round, and reactive to light.  Neck: Neck supple.  Cardiovascular: Normal rate, regular rhythm and normal heart sounds.  Respiratory: Effort normal and breath sounds normal.  GI: Soft. Bowel sounds are  normal. She exhibits no distension. There is no tenderness. There is no rebound and no guarding.  Musculoskeletal: She exhibits no edema.  Neurological: She is alert and oriented to person, place, and time.  Skin: Skin is warm.  Large, bilateral flank ecchymoses     Assessment/Plan Postoperative fever, leukocytosis likely 2/2 flank ecchymoses.  DDx adverse reaction to topical hemostatic agent, infection or bowel injury. Mild hypokalemia  -->observe with serial exams, labs -->empiric course of broad spectrum antibiotics -->VTE prophylaxis --pharmacologic/mechanical -->replace potassium  Lahoma Crocker, MD 07/29/2017, 8:07 PM

## 2017-07-29 NOTE — ED Triage Notes (Signed)
Pt had a hysterectomy on Tuesday, already received 3 chemo treatments prior to due to cancer of ovaries, complete hysterectomy, did not feel well this am, took temp 101.2 took Tylenol. Pt continues to feel weak

## 2017-07-29 NOTE — ED Provider Notes (Signed)
[facility] DEPT Provider Note   CSN: [id number] Arrival date & time: 07/29/17  1158     History   Chief Complaint Chief Complaint  Patient presents with  . Fever    HPI Colleen Mckinney is a 72 y.o. female.  72 year old female presents with fever times 24 hours.  Patient had a laparoscopic hysterectomy several days ago.  Has had some lower abdominal pain.  Denies any dysuria or hematuria.  No cough or congestion.  Denies being short of breath.  Use over-the-counter medications without relief.      Past Medical History:  Diagnosis Date  . Anxiety    claustrophobia, fear of heights  . Cancer ([place])    ovarian cancer   . Complication of anesthesia    remembers severe sore throat after kidney surgery and feeling dizzy  . 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 Left shoulder pain   . Osteoporosis   . Premature atrial complexes    see EKG in epic   . Skin lesion    "pre-cancer" -past hx.    Patient Active Problem List   Diagnosis Date Noted  . Ovarian cancer ([place]) 07/24/2017  . 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 Anemia due to antineoplastic chemotherapy 06/06/2017  . Other constipation 06/06/2017  . Cancer associated pain 05/04/2017  . Anorexia 05/04/2017  . 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 Rib pain on right side 07/02/2015  . LOW BACK PAIN, ACUTE 12/04/2008    Past Surgical History:  Procedure Laterality Date  . ABDOMINAL HYSTERECTOMY    . BREAST SURGERY Right    lumpectomy-benign  . CERVIX SURGERY     "freezing" dysplasia  . COLONOSCOPY    . DEBULKING N/A 07/24/2017   Procedure: RADICAL TUMOR DEBULKING;  Surgeon: Everitt Amber, MD;  Location: [facility] ORS;  Service: Gynecology;  Laterality: N/A;  . HYSTEROSCOPY    . IR FLUORO GUIDE PORT INSERTION RIGHT  05/15/2017  . IR US GUIDE VASC ACCESS RIGHT  05/15/2017  . NEPHRECTOMY  01/1998     right--donation  . OMENTECTOMY N/A 07/24/2017   Procedure: OMENTECTOMY;  Surgeon: Everitt Amber, MD;  Location: [facility] ORS;  Service: Gynecology;  Laterality: N/A;  . ROBOTIC ASSISTED TOTAL HYSTERECTOMY WITH BILATERAL SALPINGO OOPHERECTOMY N/A 07/24/2017   Procedure: XI ROBOTIC ASSISTED TOTAL HYSTERECTOMY WITH BILATERAL SALPINGO OOPHORECTOMY;  Surgeon: Everitt Amber, MD;  Location: [facility];  Service: Gynecology;  Laterality: N/A;  . SHOULDER ARTHROSCOPY Left    "impingement" "frozen shoulder"  . SHOULDER ARTHROSCOPY WITH SUBACROMIAL DECOMPRESSION Right 10/23/2013   Procedure: RIGHT SHOULDER ARTHROSCOPY WITH SUBACROMIAL DECOMPRESSION, EVULATION UNDER ANESTHESIA  AND MANIPULATION UNDER ANESTHESIA;  Surgeon: Johnn Hai, MD;  Location: [facility] OR[facility];  Service: Orthopedics;  Laterality: Right;    OB History    No data available       Home Medications    Prior to Admission medications   Medication Sig Start Date End Date Taking? Authorizing Provider  acetaminophen (TYLENOL) 500 MG tablet Take 500 mg by mouth every 6 (six) hours as needed for mild pain or moderate pain.     [provider]  Calcium Carbonate-Vitamin D (CALCIUM + D PO) Take 1 tablet by mouth daily.     [provider]  dexamethasone (DECADRON) 4 MG tablet Take 5 tabs the evening before chemo and 5 tabs the morning of chemo, every 21 days by mouth with  pills 05/04/17   Heath Lark, MD  enoxaparin (LOVENOX) 40 MG/0.4ML injection Inject 0.4 mLs (40 mg total) into the skin daily. 07/27/17   Joylene John D, NP  lidocaine-prilocaine (EMLA) cream Apply to affected area once Patient taking differently: Apply 1 application topically as needed. Apply to affected area once 05/04/17   Heath Lark, MD  ondansetron (ZOFRAN) 8 MG tablet Take 1 tablet (8 mg total) by mouth every 8 (eight) hours as needed for refractory nausea / vomiting. Start on day 3 after chemo. 05/04/17   Heath Lark, MD  oxyCODONE (OXY IR/ROXICODONE) 5 MG immediate  release tablet Take 1 tablet (5 mg total) by mouth every 4 (four) hours as needed for severe pain. 05/04/17   Heath Lark, MD  prochlorperazine (COMPAZINE) 10 MG tablet Take 1 tablet (10 mg total) every 6 (six) hours as needed by mouth (Nausea or vomiting). 05/16/17   Heath Lark, MD    Family History Family History  Problem Relation Age of Onset  . Heart disease Mother        mitral valve prolapse  . Hypertension Mother   . Hyperlipidemia Mother   . Cancer Mother 34       breast  . Osteopenia Mother   . Heart disease Father        bypass  . Hypertension Father   . Hyperlipidemia Father   . Cancer Father 28       bladder,  prostate  . Hyperlipidemia Sister   . Colon cancer Maternal Grandfather     Social History Social History   Tobacco Use  . Smoking status: Never Smoker  . Smokeless tobacco: Never Used  Substance Use Topics  . Alcohol use: Yes    Alcohol/week: 0.6 oz    Types: 1 Cans of beer per week    Comment: no alcohol comsumption in past month   . Drug use: No     Allergies   Patient has no known allergies.   Review of Systems Review of Systems  All other systems reviewed and are negative.    Physical Exam Updated Vital Signs BP 130/64 (BP Location: Right Arm)   Temp 99.9 F (37.7 C) (Oral)   Resp 20   Ht 1.626 m (5\' 4" )   Wt 46.3 kg (102 lb)   SpO2 97%   BMI 17.51 kg/m   Physical Exam  Constitutional: She is oriented to person, place, and time. She appears well-developed and well-nourished.  Non-toxic appearance. No distress.  HENT:  Head: Normocephalic and atraumatic.  Eyes: Conjunctivae, EOM and lids are normal. Pupils are equal, round, and reactive to light.  Neck: Normal range of motion. Neck supple. No tracheal deviation present. No thyroid mass present.  Cardiovascular: Normal rate, regular rhythm and normal heart sounds. Exam reveals no gallop.  No murmur heard. Pulmonary/Chest: Effort normal and breath sounds normal. No stridor. No  respiratory distress. She has no decreased breath sounds. She has no wheezes. She has no rhonchi. She has no rales.  Abdominal: Soft. Normal appearance and bowel sounds are normal. She exhibits no distension. There is tenderness in the suprapubic area. There is guarding. There is no rigidity, no rebound and no CVA tenderness.    Musculoskeletal: Normal range of motion. She exhibits no edema or tenderness.  Neurological: She is alert and oriented to person, place, and time. She has normal strength. No cranial nerve deficit or sensory deficit. GCS eye subscore is 4. GCS verbal subscore is 5. GCS motor subscore is  6.  Skin: Skin is warm and dry. No abrasion and no rash noted.  Psychiatric: She has a normal mood and affect. Her speech is normal and behavior is normal.  Nursing note and vitals reviewed.    ED Treatments / Results  Labs (all labs ordered are listed, but only abnormal results are displayed) Labs Reviewed  CULTURE, BLOOD (ROUTINE X 2)  CULTURE, BLOOD (ROUTINE X 2)  COMPREHENSIVE METABOLIC PANEL  CBC WITH DIFFERENTIAL/PLATELET  PROTIME-INR  URINALYSIS, ROUTINE W REFLEX MICROSCOPIC  I-STAT CG4 LACTIC ACID, ED    EKG  EKG Interpretation None       Radiology No results found.  Procedures Procedures (including critical care time)  Medications Ordered in ED Medications - No data to display   Initial Impression / Assessment and Plan / ED Course  I have reviewed the triage vital signs and the nursing notes.  Pertinent labs & imaging results that were available during my care of the patient were reviewed by me and considered in my medical decision making (see chart for details).     Pt with nl lactate and cxr neg abd ct results noted and ua pedning Pt signed out to dr Wilson Singer  Final Clinical Impressions(s) / ED Diagnoses   Final diagnoses:  None    ED Discharge Orders    None       Lacretia Leigh, MD 07/29/17 1826

## 2017-07-30 ENCOUNTER — Other Ambulatory Visit: Payer: Self-pay

## 2017-07-30 ENCOUNTER — Encounter (HOSPITAL_COMMUNITY): Payer: Self-pay

## 2017-07-30 LAB — BASIC METABOLIC PANEL
ANION GAP: 6 (ref 5–15)
BUN: 11 mg/dL (ref 6–20)
CO2: 27 mmol/L (ref 22–32)
CREATININE: 0.78 mg/dL (ref 0.44–1.00)
Calcium: 8 mg/dL — ABNORMAL LOW (ref 8.9–10.3)
Chloride: 105 mmol/L (ref 101–111)
Glucose, Bld: 95 mg/dL (ref 65–99)
Potassium: 3.4 mmol/L — ABNORMAL LOW (ref 3.5–5.1)
Sodium: 138 mmol/L (ref 135–145)

## 2017-07-30 LAB — CBC WITH DIFFERENTIAL/PLATELET
BASOS ABS: 0 10*3/uL (ref 0.0–0.1)
BASOS PCT: 0 %
EOS ABS: 0.1 10*3/uL (ref 0.0–0.7)
Eosinophils Relative: 0 %
HEMATOCRIT: 22.5 % — AB (ref 36.0–46.0)
Hemoglobin: 7.9 g/dL — ABNORMAL LOW (ref 12.0–15.0)
Lymphocytes Relative: 3 %
Lymphs Abs: 0.8 10*3/uL (ref 0.7–4.0)
MCH: 32.1 pg (ref 26.0–34.0)
MCHC: 35.1 g/dL (ref 30.0–36.0)
MCV: 91.5 fL (ref 78.0–100.0)
MONO ABS: 0.7 10*3/uL (ref 0.1–1.0)
MONOS PCT: 3 %
NEUTROS ABS: 22.4 10*3/uL — AB (ref 1.7–7.7)
NEUTROS PCT: 94 %
Platelets: 263 10*3/uL (ref 150–400)
RBC: 2.46 MIL/uL — ABNORMAL LOW (ref 3.87–5.11)
RDW: 16.5 % — AB (ref 11.5–15.5)
WBC: 24.1 10*3/uL — ABNORMAL HIGH (ref 4.0–10.5)

## 2017-07-30 MED ORDER — POTASSIUM CHLORIDE 10 MEQ/50ML IV SOLN
10.0000 meq | INTRAVENOUS | Status: AC
  Administered 2017-07-30 (×2): 10 meq via INTRAVENOUS
  Filled 2017-07-30 (×2): qty 50

## 2017-07-30 NOTE — Progress Notes (Signed)
S/P Robotic-assisted laparoscopic total hysterectomy with bilateral salpingoophorectomy, omentectomy, radical tumor debulking on 07/24/17.  Discharged home on 07/26/17.  Presented to ED on 07/29/17 due to elevated temperature post-op.  Subjective: Patient reports wanting to go home this am because she feels she heals quicker at home.  Tolerating diet with no nausea or emesis.  Ambulating.  Stating she had loose stools all last night due to stimulant given.  Voiding without difficulty. States she feels out of breath after talking for a few minutes due denies pain with deep inspiration.  Denies chest pain.    She states on Friday evening and Saturday, she was up moving around and doing light tasks without difficulty.  Beginning Sunday morning, she stated she was not feeling right and checked her temperature which was in the 100s.  She continued to monitor her temperature which continued to increase so she reached out to the MD on call.  She was instructed to seek care at the ER. She states she wishes she had not called because she did not want to be admitted.  All questions answered.  Husband at the bedside.      Objective: Vital signs in last 24 hours: Temp:  [98.3 F (36.8 C)-100.6 F (38.1 C)] 99 F (37.2 C) (01/28 0850) Pulse Rate:  [97-119] 107 (01/28 0533) Resp:  [16-32] 17 (01/28 0533) BP: (106-145)/(57-75) 124/63 (01/28 0533) SpO2:  [97 %-100 %] 97 % (01/28 0533) Weight:  [102 lb (46.3 kg)] 102 lb (46.3 kg) (01/27 1309) Last BM Date: 07/30/17  Intake/Output from previous day: No intake/output data recorded.  Physical Examination: General: alert, cooperative and no distress Resp: clear to auscultation bilaterally Cardio: tachycardic at 191YNW, no clicks, rubs, or murmurs noted GI: incision: incisions with dermabond without drainage or erythema and abdomen soft, active bowel sounds, non-tympanic, large ecchymosis noted on lateral aspects of the abdomen bilaterally and on upper aspect of  right thigh Extremities: extremities normal, atraumatic, no cyanosis or edema  Labs: WBC/Hgb/Hct/Plts:  24.1/7.9/22.5/263 (01/28 0408) BUN/Cr/glu/ALT/AST/amyl/lip:  11/0.78/--/--/--/--/-- (01/28 0408)  Assessment: 72 y.o. s/p robotic hysterectomy, BSO, omentectomy, radical tumor debulking on 07/24/17 for ovarian cancer readmitted with fever: stable Pain:  Pain is well-controlled on PRN medications.  Heme: Hgb 7.9 and Hct 22.5 this am.   ID: Post-operative fever.  Leukocytosis with level at 24.1 this am.  Blood cultures pending. Zosyn ordered.    CV: Tachycardic. Continue to monitor with routine vital sign assessments.    GI:  Tolerating po: Yes.  Antiemetics ordered PRN.   GU: Adequate output reported.    FEN: Potassium 3.2 on admission. Improving at 3.4 this am.   Prophylaxis: intermittent pneumatic compression boots.  Plan: Continue IV antibiotics until 48 hours without fever Plan for 20 meq KCL IV Discontinue senna Hold lovenox at this time Continue plan of care per Dr. Denman George Anticipate discharge morning of 1/30 unless fevers persist   LOS: 1 day    Searcy Miyoshi D Annisa Mazzarella 07/30/2017, 9:32 AM

## 2017-07-30 NOTE — Progress Notes (Signed)
Patient refused oral K+ replacement this morning; states that she is unable to swallow the pill; Has K+ in IV fluids,  K+ 3.4 this am. If additional potassium is needed, she is asking that we replace it IV.

## 2017-07-30 NOTE — Progress Notes (Signed)
Dr Lahoma Crocker in to see patient. New orders received.

## 2017-07-30 NOTE — Progress Notes (Signed)
Pt became extremely nauseated while taking oral potassium pill as well as Sennokot. Had difficult time attempting to swallow each tablet.

## 2017-07-31 ENCOUNTER — Inpatient Hospital Stay (HOSPITAL_COMMUNITY): Payer: Medicare Other

## 2017-07-31 LAB — CBC WITH DIFFERENTIAL/PLATELET
BASOS PCT: 0 %
Basophils Absolute: 0 10*3/uL (ref 0.0–0.1)
Eosinophils Absolute: 0.1 10*3/uL (ref 0.0–0.7)
Eosinophils Relative: 1 %
HEMATOCRIT: 22 % — AB (ref 36.0–46.0)
Hemoglobin: 7.5 g/dL — ABNORMAL LOW (ref 12.0–15.0)
LYMPHS PCT: 3 %
Lymphs Abs: 0.5 10*3/uL — ABNORMAL LOW (ref 0.7–4.0)
MCH: 31.4 pg (ref 26.0–34.0)
MCHC: 34.1 g/dL (ref 30.0–36.0)
MCV: 92.1 fL (ref 78.0–100.0)
MONO ABS: 0.8 10*3/uL (ref 0.1–1.0)
Monocytes Relative: 4 %
NEUTROS ABS: 18.9 10*3/uL — AB (ref 1.7–7.7)
Neutrophils Relative %: 92 %
Platelets: 307 10*3/uL (ref 150–400)
RBC: 2.39 MIL/uL — ABNORMAL LOW (ref 3.87–5.11)
RDW: 15.9 % — AB (ref 11.5–15.5)
WBC: 20.4 10*3/uL — AB (ref 4.0–10.5)

## 2017-07-31 LAB — URINALYSIS, ROUTINE W REFLEX MICROSCOPIC: Squamous Epithelial / LPF: NONE SEEN

## 2017-07-31 LAB — BASIC METABOLIC PANEL
ANION GAP: 6 (ref 5–15)
BUN: 9 mg/dL (ref 6–20)
CALCIUM: 7.9 mg/dL — AB (ref 8.9–10.3)
CO2: 27 mmol/L (ref 22–32)
Chloride: 106 mmol/L (ref 101–111)
Creatinine, Ser: 0.73 mg/dL (ref 0.44–1.00)
GFR calc non Af Amer: >60 mL/min (ref >60)
Glucose, Bld: 92 mg/dL (ref 65–99)
Potassium: 3.6 mmol/L (ref 3.5–5.1)
Sodium: 139 mmol/L (ref 135–145)

## 2017-07-31 NOTE — Progress Notes (Signed)
  PROGRESS NOTE  S/P Robotic-assisted laparoscopic total hysterectomy with bilateral salpingoophorectomy, omentectomy, radical tumor debulking on 07/24/17.  Discharged home on 07/26/17.  Presented to ED on 07/29/17 due to elevated temperature post-op. Started on Zosyn on 07/29/17.  CT showed pelvic infection but no drainable collection. No apparent GI perforation.   Subjective: Patient reports feeling better today.  Eating better, improved appetite.  Reports hip flexor pain in groin (not weakness) since admission. Not progressively worse.   Objective: Vital signs in last 24 hours: Temp:  [98.1 F (36.7 C)-100.5 F (38.1 C)] 98.1 F (36.7 C) (01/29 0647) Pulse Rate:  [106-119] 107 (01/29 0500) Resp:  [16-18] 16 (01/29 0500) BP: (107-144)/(57-71) 121/66 (01/29 0500) SpO2:  [93 %-99 %] 99 % (01/29 0500) Last BM Date: 07/30/17  Intake/Output from previous day: 01/28 0701 - 01/29 0700 In: 2160 [P.O.:360; I.V.:1600; IV Piggyback:200] Out: 0   Physical Examination: General: alert, cooperative and no distress Resp: clear to auscultation bilaterally Cardio: tachycardic at [id number], no clicks, rubs, or murmurs noted GI: incision: incisions with dermabond without drainage or erythema and abdomen soft, active bowel sounds, non-tympanic, large ecchymosis noted on lateral aspects of the abdomen bilaterally and on upper aspect of right thigh Extremities: extremities normal, atraumatic, no cyanosis or edema Pain with left hip flexion (in groin).  Labs: WBC/Hgb/Hct/Plts:  20.4/7.5/22.0/307 (01/29 0445) BUN/Cr/glu/ALT/AST/amyl/lip:  9/0.73/--/--/--/--/-- (01/29 0445)  Assessment: 72 y.o. s/p robotic hysterectomy, BSO, omentectomy, radical tumor debulking on 07/24/17 for ovarian cancer readmitted with fever: stable Pain:  Pain is well-controlled on PRN medications.  Heme: Hgb 7.5 and stable (acute blood loss anemia).  ID: Post-operative fever.  Leukocytosis improved with level at 20.4 this am.   Blood cultures pending. Zosyn ordered (today is day 2). Add Vanc if persistent spiking. Last fever was 100.5 today at 5am.  CV: Tachycardic. Continue to monitor with routine vital sign assessments.  Likely secondary to infection.  GI:  Tolerating po: Yes.  Antiemetics ordered PRN.   GU: Adequate output reported.    FEN: Potassium 3.6 today after repletion.   Prophylaxis: intermittent pneumatic compression boots.  Plan: Continue IV antibiotics until 48 hours without fever - would be prepared to accept tomorrow (08/01/17) as 48 hours given this morning's temp was only 100.5, provided the remainder of her clinical picture is improving. If new spike in the interim, would draw blood cultures and add Vanc.   If left hip flexor pain persists, consider CT abdo/pelvis to evaluate for possible psoas abscess.   Anticipate discharge morning of 1/30 unless fevers persist   LOS: 2 days    [name] 07/31/2017, 7:58 AM

## 2017-07-31 NOTE — Progress Notes (Signed)
Patient voiding cherry color urine; Melissa Cross paged and orders noted. Donne Hazel, RN

## 2017-08-01 ENCOUNTER — Inpatient Hospital Stay (HOSPITAL_COMMUNITY): Payer: Medicare Other

## 2017-08-01 LAB — CBC WITH DIFFERENTIAL/PLATELET
BASOS ABS: 0 10*3/uL (ref 0.0–0.1)
BASOS PCT: 0 %
EOS PCT: 1 %
Eosinophils Absolute: 0.2 10*3/uL (ref 0.0–0.7)
HEMATOCRIT: 21.5 % — AB (ref 36.0–46.0)
Hemoglobin: 7.3 g/dL — ABNORMAL LOW (ref 12.0–15.0)
LYMPHS PCT: 4 %
Lymphs Abs: 0.5 10*3/uL — ABNORMAL LOW (ref 0.7–4.0)
MCH: 31.1 pg (ref 26.0–34.0)
MCHC: 34 g/dL (ref 30.0–36.0)
MCV: 91.5 fL (ref 78.0–100.0)
MONO ABS: 0.6 10*3/uL (ref 0.1–1.0)
Monocytes Relative: 4 %
NEUTROS ABS: 12.8 10*3/uL — AB (ref 1.7–7.7)
Neutrophils Relative %: 91 %
PLATELETS: 368 10*3/uL (ref 150–400)
RBC: 2.35 MIL/uL — AB (ref 3.87–5.11)
RDW: 15.7 % — AB (ref 11.5–15.5)
WBC: 14.1 10*3/uL — AB (ref 4.0–10.5)

## 2017-08-01 LAB — BASIC METABOLIC PANEL
ANION GAP: 6 (ref 5–15)
BUN: 9 mg/dL (ref 6–20)
CALCIUM: 8 mg/dL — AB (ref 8.9–10.3)
CO2: 28 mmol/L (ref 22–32)
Chloride: 106 mmol/L (ref 101–111)
Creatinine, Ser: 0.72 mg/dL (ref 0.44–1.00)
Glucose, Bld: 96 mg/dL (ref 65–99)
POTASSIUM: 3.4 mmol/L — AB (ref 3.5–5.1)
Sodium: 140 mmol/L (ref 135–145)

## 2017-08-01 MED ORDER — IOTHALAMATE MEGLUMINE 17.2 % UR SOLN
250.0000 mL | Freq: Once | URETHRAL | Status: AC | PRN
  Administered 2017-08-01: 100 mL via INTRAVESICAL

## 2017-08-01 NOTE — Progress Notes (Signed)
  PROGRESS NOTE  S/P Robotic-assisted laparoscopic total hysterectomy with bilateral salpingoophorectomy, omentectomy, radical tumor debulking on 07/24/17.  Discharged home on 07/26/17.  Presented to ED on 07/29/17 due to elevated temperature post-op. Started on Zosyn on 07/29/17.  CT showed pelvic infection but no drainable collection. No apparent GI perforation.   Developed gross hematuria abruptly on 07/31/17. Foley placed. UO appropriate and renal function normal. Renal US on 07/31/17 showed no gross renal abnormality (mild hydro, though clinically no obstruction).  Urine cleared overnight, then became dark red (like broken down old clot).  Subjective: Patient reports overall feeling better, though uncomfortable overnight with SCD's and foley. Eating better, improved appetite.  Reports decreased hip flexor pain in groin (not weakness) since admission. Not progressively worse.   Objective: Vital signs in last 24 hours: Temp:  [98.4 F (36.9 C)-99 F (37.2 C)] 98.4 F (36.9 C) (01/30 0600) Pulse Rate:  [100-113] 100 (01/30 0600) Resp:  [18-20] 18 (01/30 0600) BP: (119-138)/(67-77) 126/70 (01/30 0600) SpO2:  [97 %-99 %] 98 % (01/30 0600) Last BM Date: 07/30/17  Intake/Output from previous day: 01/29 0701 - 01/30 0700 In: 2420 [P.O.:870; I.V.:1400; IV Piggyback:150] Out: 1950 [Urine:1950]  Physical Examination: General: alert, cooperative and no distress Resp: clear to auscultation bilaterally Cardio: tachycardic at [id number], no clicks, rubs, or murmurs noted GI: incision: incisions with dermabond without drainage or erythema and abdomen soft, active bowel sounds, non-tympanic, large ecchymosis noted on lateral aspects of the abdomen bilaterally and on upper aspect of right thigh Extremities: extremities normal, atraumatic, no cyanosis or edema Pain with left hip flexion (in groin). GU: dark red blood in foley bag, no urine in tubing.   Labs: WBC/Hgb/Hct/Plts:  14.1/7.3/21.5/368  (01/30 5638) BUN/Cr/glu/ALT/AST/amyl/lip:  9/0.72/--/--/--/--/-- (01/30 7564)  Assessment: 72 y.o. s/p robotic hysterectomy, BSO, omentectomy, radical tumor debulking on 07/24/17 for ovarian cancer readmitted with fever: stable Pain:  Pain is well-controlled on PRN medications.  Heme: Hgb 7.3 and stable (acute blood loss anemia).  ID: Post-operative fever.  Leukocytosis continues to improve with level at 14.1 this am.  Blood cultures pending. Zosyn ordered (today is day 3). Add Vanc if persistent spiking. Last fever was 100.5 today at 5am on 08/01/17.  CV: Tachycardic but improved. Continue to monitor with routine vital sign assessments.  Likely secondary to infection and anemia.  GI:  Tolerating po: Yes.  Antiemetics ordered PRN.   GU: Gross hematuria, normal renal function. Normal renal ultrasound. Will evaluate bladder today with CT cystogram. Continue foley drainage. Irrigate if UO drops off.  FEN: Potassium 3.4 today  Prophylaxis: intermittent pneumatic compression boots.  Plan: Continue IV antibiotics today and would change to Augmentin at discharge (x 2 weeks). CT cystogram to evaluate bladder.    LOS: 3 days    Colleen Mckinney 08/01/2017, 9:31 AM

## 2017-08-01 NOTE — Consult Note (Signed)
Urology Consult  Referring physician: Dorann Ou Reason for referral: Bladder injury  Chief Complaint: Bladder injury  History of Present Illness: Called by Dr Denman George:   S/P Robotic-assisted laparoscopic total hysterectomy with bilateral salpingoophorectomy, omentectomy, radical tumor debulking on 07/24/17.  Discharged home on 07/26/17.  Presented to ED on 07/29/17 due to elevated temperature post-op. Started on Zosyn on 07/29/17.  CT showed pelvic infection but no drainable collection. No apparent GI perforation.   the patient has a single left kidney.  The dissection was a bit challenging apparently near the area of the left ureter.  A lot of products for hemostasis were utilized.  It was felt that this may have predisposed to proceed fluid collection or source of infection having spoken to Dr. Denman George.  The patient yesterday had blood in the urine acute onset.  Foley catheter was placed.   Renal function normal single left kidney Hb stable 7.3 Blood c/s normal I did not see a urine c/s  Renal ultrasound 1/29; mild left hydro CT scan no contrast 07/29/17: gas in bladder; no drainable fluid collection; normal left kidney; large hemostatic materials pushing on left side of bladder in the left pelvis; NO HYDRO AND NO ASCITES Cystogram: I reviewed the CT scan cystogram with 2 radiologist.  It was felt that she had a very small leak at around 100 mL posteriorly and low likely on the left side of the bladder.  It was felt that the extravasation was within the hemostatic materials and that it was not intraperitoneal.  Because of the materials and collection in the pelvis the findings were not classic.  The patient normally voids every 2 or 3 hours.  She has not had kidney stones or recurrent bladder infections.  She has not had bladder surgery.  The only change in bladder function postoperatively was approximately day 6 she had a little bit of hesitancy and saw a minimal amount of blood after voiding.  She  wondered if she was starting to have a bladder infection  Modifying factors: There are no other modifying factors  Associated signs and symptoms: There are no other associated signs and symptoms Aggravating and relieving factors: There are no other aggravating or relieving factors Severity: Moderate Duration: Persistent     Past Medical History:  Diagnosis Date  . Anxiety    claustrophobia, fear of heights  . Cancer ([place])    ovarian cancer   . Complication of anesthesia    remembers severe sore throat after kidney surgery and feeling dizzy  . 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 Left shoulder pain   . Osteoporosis   . Premature atrial complexes    see EKG in epic   . Skin lesion    "pre-cancer" -past hx.   Past Surgical History:  Procedure Laterality Date  . ABDOMINAL HYSTERECTOMY    . BREAST SURGERY Right    lumpectomy-benign  . CERVIX SURGERY     "freezing" dysplasia  . COLONOSCOPY    . DEBULKING N/A 07/24/2017   Procedure: RADICAL TUMOR DEBULKING;  Surgeon: Everitt Amber, MD;  Location: [facility];  Service: Gynecology;  Laterality: N/A;  . HYSTEROSCOPY    . IR FLUORO GUIDE PORT INSERTION RIGHT  05/15/2017  . IR US GUIDE VASC ACCESS RIGHT  05/15/2017  . NEPHRECTOMY  01/1998   right--donation  . OMENTECTOMY N/A 07/24/2017   Procedure: OMENTECTOMY;  Surgeon: Everitt Amber, MD;  Location: [facility];  Service: Gynecology;  Laterality: N/A;  .  ROBOTIC ASSISTED TOTAL HYSTERECTOMY WITH BILATERAL SALPINGO OOPHERECTOMY N/A 07/24/2017   Procedure: XI ROBOTIC ASSISTED TOTAL HYSTERECTOMY WITH BILATERAL SALPINGO OOPHORECTOMY;  Surgeon: Everitt Amber, MD;  Location: [facility];  Service: Gynecology;  Laterality: N/A;  . SHOULDER ARTHROSCOPY Left    "impingement" "frozen shoulder"  . SHOULDER ARTHROSCOPY WITH SUBACROMIAL DECOMPRESSION Right 10/23/2013   Procedure: RIGHT SHOULDER ARTHROSCOPY WITH SUBACROMIAL DECOMPRESSION, EVULATION UNDER ANESTHESIA  AND MANIPULATION UNDER ANESTHESIA;   Surgeon: Johnn Hai, MD;  Location: [facility];  Service: Orthopedics;  Laterality: Right;    Medications: I have reviewed the patient's current medications. Allergies: No Known Allergies  Family History  Problem Relation Age of Onset  . Heart disease Mother        mitral valve prolapse  . Hypertension Mother   . Hyperlipidemia Mother   . Cancer Mother 5       breast  . Osteopenia Mother   . Heart disease Father        bypass  . Hypertension Father   . Hyperlipidemia Father   . Cancer Father 38       bladder,  prostate  . Hyperlipidemia Sister   . Colon cancer Maternal Grandfather    Social History:  reports that  has never smoked. she has never used smokeless tobacco. She reports that she drinks about 0.6 oz of alcohol per week. She reports that she does not use drugs.  ROS: All systems are reviewed and negative except as noted. Rest negative  Physical Exam:  Vital signs in last 24 hours: Temp:  [98.4 F (36.9 C)-99 F (37.2 C)] 98.4 F (36.9 C) (01/30 0600) Pulse Rate:  [100-113] 100 (01/30 0600) Resp:  [18-20] 18 (01/30 0600) BP: (119-138)/(67-77) 126/70 (01/30 0600) SpO2:  [97 %-99 %] 98 % (01/30 0600)  Cardiovascular: Skin warm; not flushed Respiratory: Breaths quiet; no shortness of breath Abdomen: No masses Neurological: Normal sensation to touch Musculoskeletal: Normal motor function arms and legs Lymphatics: No inguinal adenopathy Skin: No rashes Genitourinary: Soft nontender abdomen and suprapubic area; clear urine draining well and Foley catheter  Laboratory Data:  Results for orders placed or performed during the hospital encounter of 07/29/17 (from the past 72 hour(s))  Comprehensive metabolic panel     Status: Abnormal   Collection Time: 07/29/17 12:59 PM  Result Value Ref Range   Sodium 138 135 - 145 mmol/L   Potassium 3.2 (L) 3.5 - 5.1 mmol/L   Chloride 102 101 - 111 mmol/L   CO2 27 22 - 32 mmol/L   Glucose, Bld 104 (H) 65 - 99 mg/dL   BUN  12 6 - 20 mg/dL   Creatinine, Ser 0.71 0.44 - 1.00 mg/dL   Calcium 8.7 (L) 8.9 - 10.3 mg/dL   Total Protein 6.2 (L) 6.5 - 8.1 g/dL   Albumin 3.3 (L) 3.5 - 5.0 g/dL   AST 36 15 - 41 U/L   ALT 26 14 - 54 U/L   Alkaline Phosphatase 50 38 - 126 U/L   Total Bilirubin 1.3 (H) 0.3 - 1.2 mg/dL   GFR calc non Af Amer >60 >60 mL/min   GFR calc Af Amer >60 >60 mL/min    Comment: (NOTE) The eGFR has been calculated using the CKD EPI equation. This calculation has not been validated in all clinical situations. eGFR's persistently <60 mL/min signify possible Chronic Kidney Disease.    Anion gap 9 5 - 15  CBC with Differential     Status: Abnormal   Collection Time:  07/29/17 12:59 PM  Result Value Ref Range   WBC 18.0 (H) 4.0 - 10.5 K/uL   RBC 2.81 (L) 3.87 - 5.11 MIL/uL   Hemoglobin 8.9 (L) 12.0 - 15.0 g/dL   HCT 25.9 (L) 36.0 - 46.0 %   MCV 92.2 78.0 - 100.0 fL   MCH 31.7 26.0 - 34.0 pg   MCHC 34.4 30.0 - 36.0 g/dL   RDW 16.3 (H) 11.5 - 15.5 %   Platelets 300 150 - 400 K/uL   Neutrophils Relative % 93 %   Neutro Abs 16.7 (H) 1.7 - 7.7 K/uL   Lymphocytes Relative 2 %   Lymphs Abs 0.3 (L) 0.7 - 4.0 K/uL   Monocytes Relative 5 %   Monocytes Absolute 0.9 0.1 - 1.0 K/uL   Eosinophils Relative 0 %   Eosinophils Absolute 0.1 0.0 - 0.7 K/uL   Basophils Relative 0 %   Basophils Absolute 0.0 0.0 - 0.1 K/uL  Protime-INR     Status: None   Collection Time: 07/29/17 12:59 PM  Result Value Ref Range   Prothrombin Time 13.8 11.4 - 15.2 seconds   INR 1.07   Culture, blood (Routine x 2)     Status: None (Preliminary result)   Collection Time: 07/29/17 12:59 PM  Result Value Ref Range   Specimen Description BLOOD LEFT ANTECUBITAL    Special Requests      BOTTLES DRAWN AEROBIC AND ANAEROBIC Blood Culture adequate volume   Culture      NO GROWTH 2 DAYS Performed at [facility], [place] [address], [place], [place] [address]    Report Status PENDING   Culture, blood (Routine x 2)     Status:  None (Preliminary result)   Collection Time: 07/29/17  1:11 PM  Result Value Ref Range   Specimen Description BLOOD RIGHT PORTA CATH    Special Requests      BOTTLES DRAWN AEROBIC AND ANAEROBIC Blood Culture adequate volume   Culture      NO GROWTH 2 DAYS Performed at [facility], [place] [address] [place], [place] [address]    Report Status PENDING   I-Stat CG4 Lactic Acid, ED     Status: None   Collection Time: 07/29/17  1:12 PM  Result Value Ref Range   Lactic Acid, Venous 0.82 0.5 - 1.9 mmol/L  Urinalysis, Routine w reflex microscopic     Status: Abnormal   Collection Time: 07/29/17  2:50 PM  Result Value Ref Range   Color, Urine YELLOW YELLOW   APPearance CLEAR CLEAR   Specific Gravity, Urine 1.021 1.005 - 1.030   pH 5.0 5.0 - 8.0   Glucose, UA NEGATIVE NEGATIVE mg/dL   Hgb urine dipstick SMALL (A) NEGATIVE   Bilirubin Urine NEGATIVE NEGATIVE   Ketones, ur 80 (A) NEGATIVE mg/dL   Protein, ur 30 (A) NEGATIVE mg/dL   Nitrite NEGATIVE NEGATIVE   Leukocytes, UA NEGATIVE NEGATIVE   RBC / HPF 0-5 0 - 5 RBC/hpf   WBC, UA 0-5 0 - 5 WBC/hpf   Bacteria, UA NONE SEEN NONE SEEN   Squamous Epithelial / LPF 0-5 (A) NONE SEEN   Mucus PRESENT   I-Stat CG4 Lactic Acid, ED     Status: Abnormal   Collection Time: 07/29/17  3:07 PM  Result Value Ref Range   Lactic Acid, Venous 0.35 (L) 0.5 - 1.9 mmol/L  Basic metabolic panel     Status: Abnormal   Collection Time: 07/30/17  4:08 AM  Result Value Ref Range   Sodium 138 135 - 145 mmol/L   Potassium 3.4 (L) 3.5 - 5.1 mmol/L   Chloride 105 101 - 111 mmol/L   CO2 27 22 - 32 mmol/L   Glucose, Bld 95 65 - 99 mg/dL   BUN 11 6 - 20 mg/dL   Creatinine, Ser 0.78 0.44 - 1.00 mg/dL   Calcium 8.0 (L) 8.9 - 10.3 mg/dL   GFR calc non Af Amer >60 >60 mL/min   GFR calc Af Amer >60 >60 mL/min    Comment: (NOTE) The eGFR has been calculated using the CKD EPI equation. This calculation has not been validated in all clinical situations. eGFR's  persistently <60 mL/min signify possible Chronic Kidney Disease.    Anion gap 6 5 - 15  CBC WITH DIFFERENTIAL     Status: Abnormal   Collection Time: 07/30/17  4:08 AM  Result Value Ref Range   WBC 24.1 (H) 4.0 - 10.5 K/uL   RBC 2.46 (L) 3.87 - 5.11 MIL/uL   Hemoglobin 7.9 (L) 12.0 - 15.0 g/dL   HCT 22.5 (L) 36.0 - 46.0 %   MCV 91.5 78.0 - 100.0 fL   MCH 32.1 26.0 - 34.0 pg   MCHC 35.1 30.0 - 36.0 g/dL   RDW 16.5 (H) 11.5 - 15.5 %   Platelets 263 150 - 400 K/uL   Neutrophils Relative % 94 %   Neutro Abs 22.4 (H) 1.7 - 7.7 K/uL   Lymphocytes Relative 3 %   Lymphs Abs 0.8 0.7 - 4.0 K/uL   Monocytes Relative 3 %   Monocytes Absolute 0.7 0.1 - 1.0 K/uL   Eosinophils Relative 0 %   Eosinophils Absolute 0.1 0.0 - 0.7 K/uL   Basophils Relative 0 %   Basophils Absolute 0.0 0.0 - 0.1 K/uL  CBC with Differential/Platelet     Status: Abnormal   Collection Time: 07/31/17  4:45 AM  Result Value Ref Range   WBC 20.4 (H) 4.0 - 10.5 K/uL   RBC 2.39 (L) 3.87 - 5.11 MIL/uL   Hemoglobin 7.5 (L) 12.0 - 15.0 g/dL   HCT 22.0 (L) 36.0 - 46.0 %   MCV 92.1 78.0 - 100.0 fL   MCH 31.4 26.0 - 34.0 pg   MCHC 34.1 30.0 - 36.0 g/dL   RDW 15.9 (H) 11.5 - 15.5 %   Platelets 307 150 - 400 K/uL   Neutrophils Relative % 92 %   Neutro Abs 18.9 (H) 1.7 - 7.7 K/uL   Lymphocytes Relative 3 %   Lymphs Abs 0.5 (L) 0.7 - 4.0 K/uL   Monocytes Relative 4 %   Monocytes Absolute 0.8 0.1 - 1.0 K/uL   Eosinophils Relative 1 %   Eosinophils Absolute 0.1 0.0 - 0.7 K/uL   Basophils Relative 0 %   Basophils Absolute 0.0 0.0 - 0.1 K/uL  Basic metabolic panel     Status: Abnormal   Collection Time: 07/31/17  4:45 AM  Result Value Ref Range   Sodium 139 135 - 145 mmol/L   Potassium 3.6 3.5 - 5.1 mmol/L   Chloride 106 101 - 111 mmol/L   CO2 27 22 - 32 mmol/L   Glucose, Bld 92 65 - 99 mg/dL   BUN 9 6 - 20 mg/dL   Creatinine, Ser 0.73 0.44 - 1.00 mg/dL   Calcium 7.9 (L) 8.9 - 10.3 mg/dL   GFR calc non Af Amer >60  >60 mL/min   GFR calc Af Amer >60 >60 mL/min  Comment: (NOTE) The eGFR has been calculated using the CKD EPI equation. This calculation has not been validated in all clinical situations. eGFR's persistently <60 mL/min signify possible Chronic Kidney Disease.    Anion gap 6 5 - 15  Urinalysis, Routine w reflex microscopic     Status: Abnormal   Collection Time: 07/31/17  2:50 PM  Result Value Ref Range   Color, Urine RED (A) YELLOW   APPearance TURBID (A) CLEAR   Specific Gravity, Urine  1.005 - 1.030    TEST NOT REPORTED DUE TO COLOR INTERFERENCE OF URINE PIGMENT   pH  5.0 - 8.0    TEST NOT REPORTED DUE TO COLOR INTERFERENCE OF URINE PIGMENT   Glucose, UA (A) NEGATIVE mg/dL    TEST NOT REPORTED DUE TO COLOR INTERFERENCE OF URINE PIGMENT   Hgb urine dipstick (A) NEGATIVE    TEST NOT REPORTED DUE TO COLOR INTERFERENCE OF URINE PIGMENT   Bilirubin Urine (A) NEGATIVE    TEST NOT REPORTED DUE TO COLOR INTERFERENCE OF URINE PIGMENT   Ketones, ur (A) NEGATIVE mg/dL    TEST NOT REPORTED DUE TO COLOR INTERFERENCE OF URINE PIGMENT   Protein, ur (A) NEGATIVE mg/dL    TEST NOT REPORTED DUE TO COLOR INTERFERENCE OF URINE PIGMENT   Nitrite (A) NEGATIVE    TEST NOT REPORTED DUE TO COLOR INTERFERENCE OF URINE PIGMENT   Leukocytes, UA (A) NEGATIVE    TEST NOT REPORTED DUE TO COLOR INTERFERENCE OF URINE PIGMENT   RBC / HPF TOO NUMEROUS TO COUNT 0 - 5 RBC/hpf   WBC, UA TOO NUMEROUS TO COUNT 0 - 5 WBC/hpf   Bacteria, UA FEW (A) NONE SEEN   Squamous Epithelial / LPF NONE SEEN NONE SEEN   WBC Clumps PRESENT   CBC with Differential/Platelet     Status: Abnormal   Collection Time: 08/01/17  4:26 AM  Result Value Ref Range   WBC 14.1 (H) 4.0 - 10.5 K/uL   RBC 2.35 (L) 3.87 - 5.11 MIL/uL   Hemoglobin 7.3 (L) 12.0 - 15.0 g/dL   HCT 21.5 (L) 36.0 - 46.0 %   MCV 91.5 78.0 - 100.0 fL   MCH 31.1 26.0 - 34.0 pg   MCHC 34.0 30.0 - 36.0 g/dL   RDW 15.7 (H) 11.5 - 15.5 %   Platelets 368 150 - 400 K/uL    Neutrophils Relative % 91 %   Neutro Abs 12.8 (H) 1.7 - 7.7 K/uL   Lymphocytes Relative 4 %   Lymphs Abs 0.5 (L) 0.7 - 4.0 K/uL   Monocytes Relative 4 %   Monocytes Absolute 0.6 0.1 - 1.0 K/uL   Eosinophils Relative 1 %   Eosinophils Absolute 0.2 0.0 - 0.7 K/uL   Basophils Relative 0 %   Basophils Absolute 0.0 0.0 - 0.1 K/uL  Basic metabolic panel     Status: Abnormal   Collection Time: 08/01/17  4:26 AM  Result Value Ref Range   Sodium 140 135 - 145 mmol/L   Potassium 3.4 (L) 3.5 - 5.1 mmol/L   Chloride 106 101 - 111 mmol/L   CO2 28 22 - 32 mmol/L   Glucose, Bld 96 65 - 99 mg/dL   BUN 9 6 - 20 mg/dL   Creatinine, Ser 0.72 0.44 - 1.00 mg/dL   Calcium 8.0 (L) 8.9 - 10.3 mg/dL   GFR calc non Af Amer >60 >60 mL/min   GFR calc Af Amer >60 >60 mL/min    Comment: (NOTE) The  eGFR has been calculated using the CKD EPI equation. This calculation has not been validated in all clinical situations. eGFR's persistently <60 mL/min signify possible Chronic Kidney Disease.    Anion gap 6 5 - 15   Recent Results (from the past 240 hour(s))  Culture, blood (Routine x 2)     Status: None (Preliminary result)   Collection Time: 07/29/17 12:59 PM  Result Value Ref Range Status   Specimen Description BLOOD LEFT ANTECUBITAL  Final   Special Requests   Final    BOTTLES DRAWN AEROBIC AND ANAEROBIC Blood Culture adequate volume   Culture   Final    NO GROWTH 2 DAYS Performed at [facility], [place] [address], [place], [place] [address]    Report Status PENDING  Incomplete  Culture, blood (Routine x 2)     Status: None (Preliminary result)   Collection Time: 07/29/17  1:11 PM  Result Value Ref Range Status   Specimen Description BLOOD RIGHT PORTA CATH  Final   Special Requests   Final    BOTTLES DRAWN AEROBIC AND ANAEROBIC Blood Culture adequate volume   Culture   Final    NO GROWTH 2 DAYS Performed at [facility], [place] [address] [place],  [address]    Report Status  PENDING  Incomplete   Creatinine: Recent Labs    07/29/17 1259 07/30/17 0408 07/31/17 0445 08/01/17 0426  CREATININE 0.71 0.78 0.73 0.72    Xrays: See report/chart As above  Impression/Assessment:  I drew the patient and her husband a picture.  She understands that almost for certain her left ureter is normal.  Her serum creatinine is normal and the CT scan did not show a local urinoma in the ureter looked normal without contrast 2 days ago.  She does have mild fullness on ultrasound yesterday.  She has not had flank pain.  It was felt that the dissection was safely performed and there was not urine extravasation at the time.  I did not feel that a cystoscopy and retrograde was appropriate today since I did not want to introduce more fluid through the bladder injury.  I felt unless things clinically change not to put her through another CT scan with contrast.  This was all discussed  I do believe that the injury is extraperitoneal or at least not communicating with the peritoneal cavity at this stage.  I think she would have presented much sooner than this and would have had diffuse urinary ascites on the CT scan 2 days ago.  It was felt as noted above by the 2 radiologist and myself that the extravasation is likely into the hemostatic materials and such.  Clinically she is also responding very well to the Foley catheter and IV antibiotics.  The posterior injury would also be somewhat challenging intraoperatively based upon the current pelvic findings.  And certainly one does not want to injure her single left ureter with further surgery.  The family understand that the diagnosis is not 100% and that we would follow her each day to make certain that things did not change clinically.  Repeating an x-ray would be needed if symptoms or signs change.  I will call Dr. Denman George and discuss and follow carefully.  The patient understands that I would leave the catheter in likely for about 3 weeks because she  has risk factors for slower healing.  I did not start her on any antimuscarinics for bladder spasms today.  Plan:  Spoke with  Dr. Denman George: concur with intraop findings and plan: will follow  Brady Schiller A 08/01/2017, 11:45 AM

## 2017-08-01 NOTE — Care Management Important Message (Signed)
Important Message  Patient Details  Name: Colleen Mckinney MRN: [id number] Date of Birth: [birth year]   Medicare Important Message Given:  Yes    [name] 08/01/2017, 10:23 AMImportant Message  Patient Details  Name: Colleen Mckinney MRN: [id number] Date of Birth: Aug 02, 1945   Medicare Important Message Given:  Yes    [name] 08/01/2017, 10:22 AM

## 2017-08-01 NOTE — Progress Notes (Signed)
Patient consented for foley irrigation.  Foley catheter irrigated with 15 cc of sterile saline without difficulty per Dr. Denman George. Minimal amount of dark red sediment in tubing after reconnection.  Patient tolerated well.  No concerns voiced.

## 2017-08-02 DIAGNOSIS — N739 Female pelvic inflammatory disease, unspecified: Secondary | ICD-10-CM

## 2017-08-02 DIAGNOSIS — S3720XA Unspecified injury of bladder, initial encounter: Secondary | ICD-10-CM

## 2017-08-02 LAB — CBC WITH DIFFERENTIAL/PLATELET
BASOS ABS: 0 10*3/uL (ref 0.0–0.1)
Basophils Relative: 0 %
EOS ABS: 0.1 10*3/uL (ref 0.0–0.7)
Eosinophils Relative: 1 %
HCT: 27.8 % — ABNORMAL LOW (ref 36.0–46.0)
HEMOGLOBIN: 9.3 g/dL — AB (ref 12.0–15.0)
LYMPHS ABS: 0.8 10*3/uL (ref 0.7–4.0)
LYMPHS PCT: 8 %
MCH: 30.4 pg (ref 26.0–34.0)
MCHC: 33.5 g/dL (ref 30.0–36.0)
MCV: 90.8 fL (ref 78.0–100.0)
Monocytes Absolute: 0.6 10*3/uL (ref 0.1–1.0)
Monocytes Relative: 5 %
NEUTROS PCT: 86 %
Neutro Abs: 9.5 10*3/uL — ABNORMAL HIGH (ref 1.7–7.7)
Platelets: 549 10*3/uL — ABNORMAL HIGH (ref 150–400)
RBC: 3.06 MIL/uL — ABNORMAL LOW (ref 3.87–5.11)
RDW: 15.4 % (ref 11.5–15.5)
WBC: 11.1 10*3/uL — ABNORMAL HIGH (ref 4.0–10.5)

## 2017-08-02 LAB — BASIC METABOLIC PANEL
ANION GAP: 9 (ref 5–15)
BUN: 9 mg/dL (ref 6–20)
CHLORIDE: 102 mmol/L (ref 101–111)
CO2: 25 mmol/L (ref 22–32)
Calcium: 8.8 mg/dL — ABNORMAL LOW (ref 8.9–10.3)
Creatinine, Ser: 0.81 mg/dL (ref 0.44–1.00)
GFR calc Af Amer: >60 mL/min (ref >60)
Glucose, Bld: 113 mg/dL — ABNORMAL HIGH (ref 65–99)
POTASSIUM: 3.4 mmol/L — AB (ref 3.5–5.1)
SODIUM: 136 mmol/L (ref 135–145)

## 2017-08-02 MED ORDER — AMOXICILLIN-POT CLAVULANATE 875-125 MG PO TABS: 1.0000 | ORAL_TABLET | Freq: Two times a day (BID) | ORAL | 0 refills | Status: DC

## 2017-08-02 MED ORDER — AMOXICILLIN-POT CLAVULANATE 875-125 MG PO TABS
1.0000 | ORAL_TABLET | Freq: Two times a day (BID) | ORAL | Status: DC
  Administered 2017-08-02: 1 via ORAL
  Filled 2017-08-02: qty 1

## 2017-08-02 MED ORDER — HEPARIN SOD (PORK) LOCK FLUSH 100 UNIT/ML IV SOLN
500.0000 [IU] | INTRAVENOUS | Status: AC | PRN
  Administered 2017-08-02: 500 [IU]

## 2017-08-02 MED ORDER — SODIUM CHLORIDE 0.9% FLUSH
10.0000 mL | INTRAVENOUS | Status: DC | PRN
  Administered 2017-08-02: 10 mL

## 2017-08-02 NOTE — Progress Notes (Signed)
Patient looks good Catheter draining well Home/xray plans/expectations described Phone contacts given I will call her and f/up

## 2017-08-02 NOTE — Discharge Summary (Signed)
Physician Discharge Summary  Patient ID: Colleen Mckinney MRN: [id number] DOB/AGE: 1946-03-25 72 y.o.  Admit date: 07/29/2017 Discharge date: 08/02/2017  Admission Diagnoses: Pelvic abscess in female  Discharge Diagnoses:  Principal Problem:   Pelvic abscess in female Active Problems:   Fever   Postoperative fever   Bladder injury, initial encounter   Discharged Condition: good  Hospital Course:  1/ patient was admitted on 07/29/17 for postoperative fevers and a pelvic fluid collection/abscess. Admission CT scan showed a collection of pelvic fluid with stranding and gas consistent with infected pelvic hematoma.  2/ She was started on IV zosyn and continued on this until 08/02/17 at which time she had remained afebrile for >48 hours with a downward trending WBC. Antibiotics were changed to oral augmentin at that time.  3/ on postoperative day 7 (07/31/17) the patient developed sudden onset gross hematuria.  Renal ultrasound was normal. Creatinine was normal. Urine output remained normal. A foley catheter was placed. A CT urogram on 08/01/17 showed an extraperitoneal leak from the bladder consistent with a delayed onset injury of the bladder.  The foley was continued and the patient received consultation with Dr Matilde Sprang from Urology who recommended 3 weeks of indwelling catheter.  4/ on 08/01/17 the patient was meeting discharge criteria: tolerating PO, ambulating, pain well controlled on oral medications.  4/ new medications on discharge include foley catheter, augmentin x 2 weeks, she will resume lovenox for 10 days unless her urine becomes bloody again in which case this will be stopped.  She will follow up with Dr Denman George in 1 week and Dr Matilde Sprang as scheduled by his office in approximately 3 weeks.  Consults: urology  Significant Diagnostic Studies: labs:  CBC    Component Value Date/Time   WBC 11.1 (H) 08/02/2017 0534   RBC 3.06 (L) 08/02/2017 0534   HGB 9.3 (L) 08/02/2017 0534    HGB 10.1 (L) 06/25/2017 1003   HCT 27.8 (L) 08/02/2017 0534   HCT 30.9 (L) 06/25/2017 1003   PLT 549 (H) 08/02/2017 0534   PLT 275 06/25/2017 1003   MCV 90.8 08/02/2017 0534   MCV 90.1 06/25/2017 1003   MCH 30.4 08/02/2017 0534   MCHC 33.5 08/02/2017 0534   RDW 15.4 08/02/2017 0534   RDW 18.1 (H) 06/25/2017 1003   LYMPHSABS 0.8 08/02/2017 0534   LYMPHSABS 1.4 06/25/2017 1003   MONOABS 0.6 08/02/2017 0534   MONOABS 0.9 06/25/2017 1003   EOSABS 0.1 08/02/2017 0534   EOSABS 0.0 06/25/2017 1003   BASOSABS 0.0 08/02/2017 0534   BASOSABS 0.1 06/25/2017 1003    and CT scan 07/29/17: 1. Blood products and large volume hemostatic material in the pelvis correlating with operative history. Sterility is indeterminate by imaging. No organized collection for drainage. 2. High-density posterior to the pelvic collection was present preoperatively and is attributed to mass calcification. No bowel leak is seen with oral contrast reaching the transverse colon. 3. Diffuse postoperative subcutaneous gas. Small hematomas at bilateral abdominal wall port sites. 4. Right nephrectomy.  Renal US 07/31/17:  Left Kidney: Length: 11.1 cm. Echogenicity within normal limits. No mass is visualized. There is mild left hydronephrosis. Bladder: Appears normal for degree of bladder distention. Foley catheter is identified.  CT Urogram 08/01/17: There is extravasation of contrast from the posterior aspect of the bladder extending to the left of midline. Contrast installation was terminated immediately after extravasation was identified.  Treatments: IV hydration, antibiotics: Zosyn and indwelling foley catheter  Discharge Exam: Blood pressure 119/85, pulse Marland Kitchen)  123, temperature 98.2 F (36.8 C), temperature source Oral, resp. rate 18, height 5\' 4"  (1.626 m), weight 102 lb (46.3 kg), SpO2 99 %. General appearance: alert and cooperative GI: soft, non-tender; bowel sounds normal; no masses,  no organomegaly and  significant echymoses on flanks Incision/Wound: clean, dry and in tact x 5. Urine blood tinged.   Disposition: 01-Home or Self Care  Discharge Instructions    (HEART FAILURE PATIENTS) Call MD:  Anytime you have any of the following symptoms: 1) 3 pound weight gain in 24 hours or 5 pounds in 1 week 2) shortness of breath, with or without a dry hacking cough 3) swelling in the hands, feet or stomach 4) if you have to sleep on extra pillows at night in order to breathe.   Complete by:  As directed    Call MD for:  difficulty breathing, headache or visual disturbances   Complete by:  As directed    Call MD for:  extreme fatigue   Complete by:  As directed    Call MD for:  hives   Complete by:  As directed    Call MD for:  persistant dizziness or light-headedness   Complete by:  As directed    Call MD for:  persistant nausea and vomiting   Complete by:  As directed    Call MD for:  redness, tenderness, or signs of infection (pain, swelling, redness, odor or green/yellow discharge around incision site)   Complete by:  As directed    Call MD for:  severe uncontrolled pain   Complete by:  As directed    Call MD for:  temperature >100.4   Complete by:  As directed    Diet - low sodium heart healthy   Complete by:  As directed    Diet general   Complete by:  As directed    Driving Restrictions   Complete by:  As directed    No driving for 7 days or until off narcotic pain medication   Increase activity slowly   Complete by:  As directed    Remove dressing in 24 hours   Complete by:  As directed    Sexual Activity Restrictions   Complete by:  As directed    No intercourse for 6 weeks       Current Facility-Administered Medications:  .  acetaminophen (TYLENOL) tablet 650 mg, 650 mg, Oral, Q6H PRN, Lahoma Crocker, MD, 650 mg at 07/30/17 2137 .  amoxicillin-clavulanate (AUGMENTIN) 875-125 MG per tablet 1 tablet, 1 tablet, Oral, Q12H, Everitt Amber, MD .  oxyCODONE (Oxy  IR/ROXICODONE) immediate release tablet 5 mg, 5 mg, Oral, Q4H PRN, Lahoma Crocker, MD .  Lovenox 30mg  q day x 10 days.   Signed: [name] 08/02/2017, 9:34 AM

## 2017-08-02 NOTE — Discharge Instructions (Signed)
08/02/2017   Activity: 1. Be up and out of the bed during the day.  Take a nap if needed.  You may walk up steps but be careful and use the hand rail.  Stair climbing will tire you more than you think, you may need to stop part way and rest.   2. No lifting or straining for 4 weeks.  3. No driving restrictions.  Do Not drive if you are taking narcotic pain medicine.  4. Shower daily.  Use soap and water on your incision and pat dry; don't rub.   5. No sexual activity and nothing in the vagina for 8 weeks.  Medications:  - Take ibuprofen and tylenol first line for pain control. Take these regularly (every 6 hours) to decrease the build up of pain.  - If necessary, for severe pain not relieved by ibuprofen, take percocet.  - While taking percocet you should take sennakot every night to reduce the likelihood of constipation. If this causes diarrhea, stop its use.  - Continue to take your Lovenox shot until you are 2 weeks out from surgery. If you notice that your urine becomes more bloody while on the Lovenox, please stop the Lonvenox.   - Take the antibiotic augmentin twice a day for 2 weeks.   Diet: 1. Low sodium Heart Healthy Diet is recommended.  2. It is safe to use a laxative if you have difficulty moving your bowels.   Wound Care: 1. Keep clean and dry.  Shower daily.  Reasons to call the Doctor:   Fever - Oral temperature greater than 100.4 degrees Fahrenheit  Foul-smelling vaginal discharge  Difficulty urinating  Nausea and vomiting  Increased pain at the site of the incision that is unrelieved with pain medicine.  Difficulty breathing with or without chest pain  New calf pain especially if only on one side  Sudden, continuing increased vaginal bleeding with or without clots.   Follow-up: 1. See Everitt Amber in next week. 2. Dr Matilde Sprang from [facility] will schedule follow-up with you and notify you of this appointment  Contacts: For questions or  concerns you should contact:  Dr. Everitt Amber at [phone] After hours and on week-ends call [phone] and ask to speak to the physician on call for Gynecologic Oncology

## 2017-08-02 NOTE — Progress Notes (Signed)
Assessment unchanged. Pt verbalized understanding of dc instructions through teach back including follow up care with Dr. Denman George and Dr. McDiramid, as well as when to call the doctor. No scripts. Pt verbalized and demonstrated understanding of foley care and how to connect leg bag and switch to larger, night foley bag. Discharged via wc to front entrance to meet awaiting vehicle to carry home. Accompanied by NT.

## 2017-08-03 LAB — CULTURE, BLOOD (ROUTINE X 2)
CULTURE: NO GROWTH
Culture: NO GROWTH
SPECIAL REQUESTS: ADEQUATE
Special Requests: ADEQUATE

## 2017-08-06 ENCOUNTER — Telehealth: Payer: Self-pay | Admitting: Gynecologic Oncology

## 2017-08-06 NOTE — Telephone Encounter (Signed)
Called to check on current status.  Patient stating she is getting better each day.  All questions answered.

## 2017-08-08 ENCOUNTER — Telehealth: Payer: Self-pay | Admitting: *Deleted

## 2017-08-08 ENCOUNTER — Encounter: Payer: Self-pay | Admitting: Gynecologic Oncology

## 2017-08-08 ENCOUNTER — Inpatient Hospital Stay: Payer: Medicare Other | Attending: Gynecologic Oncology | Admitting: Gynecologic Oncology

## 2017-08-08 ENCOUNTER — Other Ambulatory Visit: Payer: Self-pay | Admitting: Gynecologic Oncology

## 2017-08-08 ENCOUNTER — Inpatient Hospital Stay: Payer: Medicare Other

## 2017-08-08 VITALS — BP 120/64 | HR 109 | Temp 97.6°F | Resp 18 | Wt 96.5 lb

## 2017-08-08 DIAGNOSIS — T8131XA Disruption of external operation (surgical) wound, not elsewhere classified, initial encounter: Secondary | ICD-10-CM | POA: Diagnosis not present

## 2017-08-08 DIAGNOSIS — Z9221 Personal history of antineoplastic chemotherapy: Secondary | ICD-10-CM | POA: Insufficient documentation

## 2017-08-08 DIAGNOSIS — D509 Iron deficiency anemia, unspecified: Secondary | ICD-10-CM

## 2017-08-08 DIAGNOSIS — Z90722 Acquired absence of ovaries, bilateral: Secondary | ICD-10-CM | POA: Insufficient documentation

## 2017-08-08 DIAGNOSIS — C569 Malignant neoplasm of unspecified ovary: Secondary | ICD-10-CM

## 2017-08-08 DIAGNOSIS — Z90710 Acquired absence of both cervix and uterus: Secondary | ICD-10-CM | POA: Diagnosis not present

## 2017-08-08 DIAGNOSIS — D62 Acute posthemorrhagic anemia: Secondary | ICD-10-CM

## 2017-08-08 LAB — CBC WITH DIFFERENTIAL (CANCER CENTER ONLY)
BASOS ABS: 0 10*3/uL (ref 0.0–0.1)
BASOS PCT: 0 %
EOS ABS: 0.2 10*3/uL (ref 0.0–0.5)
Eosinophils Relative: 2 %
HEMATOCRIT: 27.5 % — AB (ref 34.8–46.6)
Hemoglobin: 8.9 g/dL — ABNORMAL LOW (ref 11.6–15.9)
Lymphocytes Relative: 8 %
Lymphs Abs: 0.9 10*3/uL (ref 0.9–3.3)
MCH: 30.4 pg (ref 25.1–34.0)
MCHC: 32.4 g/dL (ref 31.5–36.0)
MCV: 93.9 fL (ref 79.5–101.0)
MONO ABS: 0.9 10*3/uL (ref 0.1–0.9)
MONOS PCT: 8 %
Neutro Abs: 8.8 10*3/uL — ABNORMAL HIGH (ref 1.5–6.5)
Neutrophils Relative %: 82 %
PLATELETS: 471 10*3/uL — AB (ref 145–400)
RBC: 2.93 MIL/uL — ABNORMAL LOW (ref 3.70–5.45)
RDW: 15.3 % — AB (ref 11.2–14.5)
WBC Count: 10.8 10*3/uL — ABNORMAL HIGH (ref 3.9–10.3)

## 2017-08-08 MED ORDER — FERROUS GLUCONATE 225 (27 FE) MG PO TABS: 240.0000 mg | ORAL_TABLET | Freq: Three times a day (TID) | ORAL | 3 refills | Status: DC

## 2017-08-08 NOTE — Telephone Encounter (Signed)
Called and gave the patient the new patient appt for Dr. Elson Areas. Appt for February 20th at [place].

## 2017-08-08 NOTE — Progress Notes (Signed)
Consult Note: Gyn-Onc  Consult was requested by Dr.Fogleman for the evaluation of Francesa D Malerba 72 y.o. female  CC:  No chief complaint on file.   Assessment/Plan:  Ms. TYLENA PRISK is a 72 y.o. with evidence stage IIIC ovarian cancer s/p neoadjuvant chemotherapy with good response after 3 cycles s/p interval debulking on 07/24/17.  She has had a complicated post-op course with pelvic infection and bladder injury. Continue foley Check CBC and give iron replacement if persistently anemic. If spikes fevers would give flagyl, continue augmentin. Follow-up with Dr Matilde Sprang as scheduled for management of bladder injury. Will see me in 2 weeks for re-evaluation for fitness for chemotherapy.  HPI: Ms. PAULETTA PICKNEY is a 72 y.o. gravida 5 para 4 who reports a gradual sensation of pelvic heaviness which she thought might be related to genital prolapse.  She presented for evaluation of an ultrasound was performed which demonstrated a uterus measuring 6 x 5 cm with a 1.3 mm endometrial stripe.  Multiple heterogeneous pelvic masses were appreciated the right measured 8 x 5 cm and another measured 10 cm just posterior to the uterus CA-125 was obtained and returned a value of 404.8.  CT scan was obtained on April 25, 2017 and is notable for a capsular mass along the dome of the right hepatic lobe measuring 2.5 x 2.3 x 1.4 cm.  There are several tiny hypodense lesion lateral segment of the left hepatic lobe probably benign cysts.  The right kidney is surgically absent dorsal to the uterus there is a 12.7 x 7.1 x 7.3 cm primarily solid mass with cystic elements the mass is intimately associated with the uterus but also abuts the rectosigmoid extensive and omental caking was appreciated most notably in the left abdomen measuring up to 2.9 cm in thickness there was scattered tumor in the mesentery in the right colon mesentery was noted to have a 2.9 x 2.2 cm mesenteric mass.  The appearance is compatible  with widespread peritoneal and omental metastatic disease.  Ms. gentle reports early satiety for approximately 2 months and a 10 pound weight loss over the last year.  She also reports mild abdominal bloating and sensation of pelvic heaviness.  Her family history is notable for mother diagnosed with breast cancer at the age of 23 and maternal grandfather with colon cancer and a father who was diagnosed with bladder cancer in his 70s and prostate cancer in the 71s.  She underwent a colonoscopy approximately 3 years ago and is up-to-date on mammography.  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  On May 16, 2017 until June 25, 2017 she received 3 cycles of neoadjuvant carboplatin paclitaxel without issue.  Overall she feels well.  Her ascites has dramatically reduced.  She can eat well and denies abdominal pain.  She reports that her bowels are moving well and denies constipation.  CT scan of the abdomen and pelvis performed on 07/09/2017 revealed mild ascites decreased since prior study.  Previously seen left omental soft tissue mass has nearly completely resolved now measuring 3.8 x 0.8 cm.  Previously seen right lower quadrant peritoneal mass is no longer visualized.  Complex cystic and solid mass in the central pelvis is decreased in size since the previous study currently measuring 9.4 x 8.3 cm.  The previously seen peritoneal tumor implant along the dome of the right hepatic lobe has nearly completely resolved since the previous study.  There is no lymphadenopathy.  Ca1 25 drawn on day 1 of cycle 3 of her chemotherapy has substantially reduced to 64.5.  Interval Hx:  On 07/24/17 she was admitted for a robotic assisted total hysterectomy, BSO, omentectomy, radical  tumor debulking. She had minimal omental disease which was completely resected. She did still have an 8cm necrotic tumor mass on the left ovary adherent to the anterior wall of the rectum. The was resected with a thin rind of tumor remainng on the rectum. There was significant oozing at the surgical bed and thrombotic agents were used and blood was transfused postop. She was discharged on day 2.  She was readmitted on day 5 postop (07/29/17) with postop fevers. CT imaging showed stranding in the cul de sac consistent with infected hematoma, but not organized abscess. She was started on IV antibiotics (zosyn) which were continued until she remained afebrile x 2 days. On 08/02/16 she developed gross hematuria. A foley was placed. CT urogram confirmed a bladder injury. Dr Matilde Sprang from urology was consulted and the foley was continued.   She presents today for post-discharge follow-up.She reports some drainage from around the urethral meatus. She denies fevers. She is weak. She has a poor appetite.   Review of Systems:  Constitutional  Feels fatigued Cardiovascular  No chest pain, shortness of breath, or edema  Pulmonary  No cough or wheeze.  Gastro Intestinal  No nausea, vomitting, or diarrhoea. No bright red blood per rectum, no abdominal pain, ports an episode of diarrhea last week change in bowel movement, or constipation or early satiety.  Genito Urinary  + leakage around urethra Musculo Skeletal  No myalgia, arthralgia, joint swelling reports twinges of the anterior abdominal wall Neurologic  No weakness, numbness, change in gait,  Psychology  No depression, anxiety, insomnia.    Current Meds:  Outpatient Encounter Medications as of 08/08/2017  Medication Sig  . acetaminophen (TYLENOL) 500 MG tablet Take 500 mg by mouth every 6 (six) hours as needed for mild pain or moderate pain.   Marland Kitchen amoxicillin-clavulanate (AUGMENTIN) 875-125 MG tablet Take 1 tablet by mouth every 12 (twelve)  hours.  . Calcium Carbonate-Vitamin D (CALCIUM + D PO) Take 1 tablet by mouth daily.   Marland Kitchen dexamethasone (DECADRON) 4 MG tablet Take 5 tabs the evening before chemo and 5 tabs the morning of chemo, every 21 days by mouth with pills  . enoxaparin (LOVENOX) 40 MG/0.4ML injection Inject 0.4 mLs (40 mg total) into the skin daily.  Marland Kitchen lidocaine-prilocaine (EMLA) cream Apply to affected area once (Patient taking differently: Apply 1 application topically as needed. Apply to affected area once)  . ondansetron (ZOFRAN) 8 MG tablet Take 1 tablet (8 mg total) by mouth every 8 (eight) hours as needed for refractory nausea / vomiting. Start on day 3 after chemo.  Marland Kitchen oxyCODONE (OXY IR/ROXICODONE) 5 MG immediate release tablet Take 1 tablet (5 mg total) by mouth every 4 (four) hours as needed for severe pain.  Marland Kitchen prochlorperazine (COMPAZINE) 10 MG tablet Take 1 tablet (10 mg total) every 6 (six) hours as needed by mouth (Nausea or vomiting).   No facility-administered encounter medications on file as of 08/08/2017.     Allergy: No Known Allergies  Social Hx:   Social History   Socioeconomic History  . Marital status: Divorced    Spouse name: Not on file  .  Number of children: Not on file  . Years of education: Not on file  . Highest education level: Not on file  Social Needs  . Financial resource strain: Not on file  . Food insecurity - worry: Not on file  . Food insecurity - inability: Not on file  . Transportation needs - medical: Not on file  . Transportation needs - non-medical: Not on file  Occupational History  . Occupation: retired-- Manufacturing engineer  Tobacco Use  . Smoking status: Never Smoker  . Smokeless tobacco: Never Used  Substance and Sexual Activity  . Alcohol use: Yes    Alcohol/week: 0.6 oz    Types: 1 Cans of beer per week    Comment: no alcohol comsumption in past month   . Drug use: No  . Sexual activity: Not Currently    Partners: Male  Other Topics Concern  . Not on file   Social History Narrative  . Not on file    Past Surgical Hx:  Past Surgical History:  Procedure Laterality Date  . ABDOMINAL HYSTERECTOMY    . BREAST SURGERY Right    lumpectomy-benign  . CERVIX SURGERY     "freezing" dysplasia  . COLONOSCOPY    . DEBULKING N/A 07/24/2017   Procedure: RADICAL TUMOR DEBULKING;  Surgeon: Everitt Amber, MD;  Location: [facility];  Service: Gynecology;  Laterality: N/A;  . HYSTEROSCOPY    . IR FLUORO GUIDE PORT INSERTION RIGHT  05/15/2017  . IR US GUIDE VASC ACCESS RIGHT  05/15/2017  . NEPHRECTOMY  01/1998   right--donation  . OMENTECTOMY N/A 07/24/2017   Procedure: OMENTECTOMY;  Surgeon: Everitt Amber, MD;  Location: [facility];  Service: Gynecology;  Laterality: N/A;  . ROBOTIC ASSISTED TOTAL HYSTERECTOMY WITH BILATERAL SALPINGO OOPHERECTOMY N/A 07/24/2017   Procedure: XI ROBOTIC ASSISTED TOTAL HYSTERECTOMY WITH BILATERAL SALPINGO OOPHORECTOMY;  Surgeon: Everitt Amber, MD;  Location: [facility];  Service: Gynecology;  Laterality: N/A;  . SHOULDER ARTHROSCOPY Left    "impingement" "frozen shoulder"  . SHOULDER ARTHROSCOPY WITH SUBACROMIAL DECOMPRESSION Right 10/23/2013   Procedure: RIGHT SHOULDER ARTHROSCOPY WITH SUBACROMIAL DECOMPRESSION, EVULATION UNDER ANESTHESIA  AND MANIPULATION UNDER ANESTHESIA;  Surgeon: Johnn Hai, MD;  Location: [facility];  Service: Orthopedics;  Laterality: Right;    Past Medical Hx:  Past Medical History:  Diagnosis Date  . Anxiety    claustrophobia, fear of heights  . Cancer ([place])    ovarian cancer   . Complication of anesthesia    remembers severe sore throat after kidney surgery and feeling dizzy  . 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 Left shoulder pain   . Osteoporosis   . Premature atrial complexes    see EKG in epic   . Skin lesion    "pre-cancer" -past hx.    Past Gynecological History: Gravida 5 para 4 menopause occurred 16 years ago after hysteroscopy for abnormal bleeding remote history of abnormal Pap  tests treated with cryotherapy  Family Hx:  Family History  Problem Relation Age of Onset  . Heart disease Mother        mitral valve prolapse  . Hypertension Mother   . Hyperlipidemia Mother   . Cancer Mother 38       breast  . Osteopenia Mother   . Heart disease Father        bypass  . Hypertension Father   . Hyperlipidemia Father   . Cancer Father 84       bladder,  prostate  . Hyperlipidemia Sister   . Colon cancer Maternal Grandfather     Vitals:  Blood pressure 120/64, pulse (!) 109, temperature 97.6 F (36.4 C), temperature source Oral, resp. rate 18, weight 96 lb 8 oz (43.8 kg), SpO2 100 %.  Physical Exam: WD in NAD Neck  Supple NROM, without any enlargements.  Lymph Node Survey No cervical supraclavicular or inguinal adenopathy Cardiovascular  Pulse normal rate, regularity and rhythm. S1 and S2 normal.  Lungs  Clear to auscultation bilaterally, without wheezes/crackles/rhonchi. Good air movement.  Skin  No rash/lesions/breakdown  Psychiatry  Alert and oriented appropriate mood affect speech and reasoning. Abdomen  Normoactive bowel sounds, abdomen soft, non-tender.  No masses. Incisions clean and in tact and no hematomas or infection. Right lower quadrant incision with palpable small ballotable/reducible fluid collection below it.  Back No CVA tenderness Genito Urinary  Vulva/vagina: there is an in tact vaginal cuff with some purulent fluid evacuating between suture lines. Foley bag has orange urine with sediment. No gross hematuria.  Rectal  deferred Extremities  No bilateral cyanosis, clubbing or edema.   [name], MD, PhD 08/08/2017, 5:54 PM

## 2017-08-08 NOTE — Progress Notes (Signed)
Patient informed of CBC results along with Dr. Serita Grit recommendations for ferrous gluconate three times daily.  All questions answered.  Advised to monitor her bowels and use laxatives or stool softeners as needed.  Advised to call for any needs or concerns.

## 2017-08-08 NOTE — Patient Instructions (Signed)
Please follow-up with Dr Matilde Sprang as scheduled. Follow-up has been scheduled with Dr Denman George in 2 weeks. Dr Serita Grit office will facilitate follow-up with Dr Alvy Bimler to restart chemotherapy after your condition has improved.  Please notify Dr Serita Grit office if you develop fever >100.4

## 2017-08-09 ENCOUNTER — Telehealth: Payer: Self-pay | Admitting: Gynecologic Oncology

## 2017-08-09 NOTE — Telephone Encounter (Signed)
Returned call to patient.  Patient stating she has had no urine output in her leg bag for the past 4 hours and when she stands up she has moderate leakage.  I spoke with RN at [employer] and patient is to come into the office today at 2:30pm.

## 2017-08-14 ENCOUNTER — Other Ambulatory Visit (HOSPITAL_COMMUNITY): Payer: Self-pay | Admitting: General Surgery

## 2017-08-14 ENCOUNTER — Other Ambulatory Visit (HOSPITAL_COMMUNITY): Payer: Self-pay | Admitting: Behavioral Health

## 2017-08-14 DIAGNOSIS — S3720XA Unspecified injury of bladder, initial encounter: Secondary | ICD-10-CM

## 2017-08-22 ENCOUNTER — Inpatient Hospital Stay (HOSPITAL_BASED_OUTPATIENT_CLINIC_OR_DEPARTMENT_OTHER): Payer: Medicare Other | Admitting: Gynecologic Oncology

## 2017-08-22 ENCOUNTER — Encounter: Payer: Self-pay | Admitting: Hematology and Oncology

## 2017-08-22 ENCOUNTER — Ambulatory Visit (HOSPITAL_COMMUNITY)
Admission: RE | Admit: 2017-08-22 | Discharge: 2017-08-22 | Disposition: A | Discharge status: Home / Self Care | Payer: Medicare Other | Source: Ambulatory Visit | Attending: General Surgery | Admitting: General Surgery

## 2017-08-22 ENCOUNTER — Telehealth: Payer: Self-pay | Admitting: Hematology and Oncology

## 2017-08-22 ENCOUNTER — Encounter: Payer: Self-pay | Admitting: Gynecologic Oncology

## 2017-08-22 ENCOUNTER — Inpatient Hospital Stay (HOSPITAL_BASED_OUTPATIENT_CLINIC_OR_DEPARTMENT_OTHER): Payer: Medicare Other | Admitting: Hematology and Oncology

## 2017-08-22 ENCOUNTER — Inpatient Hospital Stay: Payer: Medicare Other

## 2017-08-22 ENCOUNTER — Other Ambulatory Visit: Payer: Self-pay | Admitting: Hematology and Oncology

## 2017-08-22 VITALS — BP 121/59 | HR 80 | Temp 97.8°F | Resp 18 | Ht 64.0 in | Wt 95.7 lb

## 2017-08-22 VITALS — BP 127/80 | HR 79 | Temp 97.8°F | Resp 20

## 2017-08-22 DIAGNOSIS — T8131XA Disruption of external operation (surgical) wound, not elsewhere classified, initial encounter: Secondary | ICD-10-CM

## 2017-08-22 DIAGNOSIS — S3720XA Unspecified injury of bladder, initial encounter: Secondary | ICD-10-CM | POA: Insufficient documentation

## 2017-08-22 DIAGNOSIS — R630 Anorexia: Secondary | ICD-10-CM

## 2017-08-22 DIAGNOSIS — C786 Secondary malignant neoplasm of retroperitoneum and peritoneum: Secondary | ICD-10-CM

## 2017-08-22 DIAGNOSIS — G620 Drug-induced polyneuropathy: Secondary | ICD-10-CM | POA: Diagnosis not present

## 2017-08-22 DIAGNOSIS — D6481 Anemia due to antineoplastic chemotherapy: Secondary | ICD-10-CM

## 2017-08-22 DIAGNOSIS — R5082 Postprocedural fever: Secondary | ICD-10-CM | POA: Diagnosis not present

## 2017-08-22 DIAGNOSIS — Z9221 Personal history of antineoplastic chemotherapy: Secondary | ICD-10-CM

## 2017-08-22 DIAGNOSIS — C561 Malignant neoplasm of right ovary: Secondary | ICD-10-CM

## 2017-08-22 DIAGNOSIS — T451X5A Adverse effect of antineoplastic and immunosuppressive drugs, initial encounter: Secondary | ICD-10-CM

## 2017-08-22 DIAGNOSIS — C562 Malignant neoplasm of left ovary: Secondary | ICD-10-CM

## 2017-08-22 DIAGNOSIS — X58XXXA Exposure to other specified factors, initial encounter: Secondary | ICD-10-CM | POA: Insufficient documentation

## 2017-08-22 DIAGNOSIS — Z90710 Acquired absence of both cervix and uterus: Secondary | ICD-10-CM

## 2017-08-22 DIAGNOSIS — Z90722 Acquired absence of ovaries, bilateral: Secondary | ICD-10-CM

## 2017-08-22 DIAGNOSIS — C569 Malignant neoplasm of unspecified ovary: Secondary | ICD-10-CM

## 2017-08-22 DIAGNOSIS — C801 Malignant (primary) neoplasm, unspecified: Secondary | ICD-10-CM

## 2017-08-22 LAB — CBC WITH DIFFERENTIAL/PLATELET
Basophils Absolute: 0.1 10*3/uL (ref 0.0–0.1)
Basophils Relative: 1 %
EOS ABS: 0.5 10*3/uL (ref 0.0–0.5)
EOS PCT: 9 %
HCT: 29.9 % — ABNORMAL LOW (ref 34.8–46.6)
Hemoglobin: 9.9 g/dL — ABNORMAL LOW (ref 11.6–15.9)
LYMPHS ABS: 0.8 10*3/uL — AB (ref 0.9–3.3)
Lymphocytes Relative: 13 %
MCH: 30.6 pg (ref 25.1–34.0)
MCHC: 33.1 g/dL (ref 31.5–36.0)
MCV: 92.4 fL (ref 79.5–101.0)
MONO ABS: 0.5 10*3/uL (ref 0.1–0.9)
MONOS PCT: 8 %
Neutro Abs: 4.2 10*3/uL (ref 1.5–6.5)
Neutrophils Relative %: 69 %
PLATELETS: 236 10*3/uL (ref 145–400)
RBC: 3.23 MIL/uL — ABNORMAL LOW (ref 3.70–5.45)
RDW: 15.9 % — AB (ref 11.2–14.5)
WBC: 6 10*3/uL (ref 3.9–10.3)

## 2017-08-22 LAB — COMPREHENSIVE METABOLIC PANEL
ALK PHOS: 55 U/L (ref 40–150)
ALT: 8 U/L (ref 0–55)
AST: 16 U/L (ref 5–34)
Albumin: 3.5 g/dL (ref 3.5–5.0)
Anion gap: 8 (ref 3–11)
BUN: 15 mg/dL (ref 7–26)
CHLORIDE: 103 mmol/L (ref 98–109)
CO2: 30 mmol/L — AB (ref 22–29)
CREATININE: 0.86 mg/dL (ref 0.60–1.10)
Calcium: 9.7 mg/dL (ref 8.4–10.4)
GFR calc Af Amer: >60 mL/min (ref >60)
GFR calc non Af Amer: >60 mL/min (ref >60)
Glucose, Bld: 71 mg/dL (ref 70–140)
Potassium: 4.2 mmol/L (ref 3.5–5.1)
SODIUM: 141 mmol/L (ref 136–145)
Total Bilirubin: 0.2 mg/dL (ref 0.2–1.2)
Total Protein: 7 g/dL (ref 6.4–8.3)

## 2017-08-22 LAB — SAMPLE TO BLOOD BANK

## 2017-08-22 LAB — FERRITIN: FERRITIN: 340 ng/mL — AB (ref 9–269)

## 2017-08-22 LAB — IRON AND TIBC
Iron: 40 ug/dL — ABNORMAL LOW (ref 41–142)
Saturation Ratios: 19 % — ABNORMAL LOW (ref 21–57)
TIBC: 216 ug/dL — AB (ref 236–444)
UIBC: 176 ug/dL

## 2017-08-22 MED ORDER — IOPAMIDOL (ISOVUE-300) INJECTION 61%
INTRAVENOUS | Status: AC
  Filled 2017-08-22: qty 50

## 2017-08-22 MED ORDER — IOTHALAMATE MEGLUMINE 17.2 % UR SOLN: 250.0000 mL | Freq: Once | URETHRAL | Status: DC | PRN

## 2017-08-22 MED ORDER — ESTROGENS, CONJUGATED 0.625 MG/GM VA CREA: 1.0000 | TOPICAL_CREAM | Freq: Every day | VAGINAL | 12 refills | Status: AC

## 2017-08-22 NOTE — Progress Notes (Signed)
Consult Note: Gyn-Onc  Consult was requested by Dr.Fogleman for the evaluation of Colleen Mckinney 72 y.o. female  CC:  Chief Complaint  Patient presents with  . Malignant neoplasm of ovary, unspecified laterality [facility])    Assessment/Plan:  Colleen Mckinney is a 72 y.o. with evidence stage IIIC ovarian cancer s/p neoadjuvant chemotherapy with good response after 3 cycles s/p interval debulking on 07/24/17.  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 Continue foley until recommended discontinuation with Dr Matilde Sprang. Vaginal premarin nightly for vaginal cuff separation/fistula.  I discussed that if the cystogram today shows fistula, she may need the foley to continue (if it keeps her relatively dry). She would need to get started on chemotherapy, however, this will certainly slow the liklihood of healing her fistula should there be one. I discussed that she may need surgical repair after completing chemotherapy if there is persistent fistula at that time.  Alternatively, the vaginal fluid drainage may be peritoneal drainage from old infected material/old urinoma. It is a different appearance from what is in the foley bag. If this is the case, the foley might be removed sooner, though I would still recommend vaginal premarin.    HPI: Colleen Mckinney is a 72 y.o. gravida 5 para 4 who reports a gradual sensation of pelvic heaviness which she thought might be related to genital prolapse.  She presented for evaluation of an ultrasound was performed which demonstrated a uterus measuring 6 x 5 cm with a 1.3 mm endometrial stripe.  Multiple heterogeneous pelvic masses were appreciated the right measured 8 x 5 cm and another measured 10 cm just posterior to the uterus CA-125 was obtained and returned a value of 404.8.  CT scan was obtained on April 25, 2017 and is notable for a capsular mass along the dome of the right  hepatic lobe measuring 2.5 x 2.3 x 1.4 cm.  There are several tiny hypodense lesion lateral segment of the left hepatic lobe probably benign cysts.  The right kidney is surgically absent dorsal to the uterus there is a 12.7 x 7.1 x 7.3 cm primarily solid mass with cystic elements the mass is intimately associated with the uterus but also abuts the rectosigmoid extensive and omental caking was appreciated most notably in the left abdomen measuring up to 2.9 cm in thickness there was scattered tumor in the mesentery in the right colon mesentery was noted to have a 2.9 x 2.2 cm mesenteric mass.  The appearance is compatible with widespread peritoneal and omental metastatic disease.  Ms. gentle reports early satiety for approximately 2 months and a 10 pound weight loss over the last year.  She also reports mild abdominal bloating and sensation of pelvic heaviness.  Her family history is notable for mother diagnosed with breast cancer at the age of 96 and maternal grandfather with colon cancer and a father who was diagnosed with bladder cancer in his 61s and prostate cancer in the 33s.  She underwent a colonoscopy approximately 3 years ago and is up-to-date on mammography.  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  On May 16, 2017 until June 25, 2017 she received 3 cycles of neoadjuvant carboplatin paclitaxel without issue.  Overall she feels well.  Her ascites has dramatically reduced.  She can eat well and denies abdominal pain.  She reports that her bowels are moving well and denies constipation.  CT scan of the abdomen and pelvis performed on 07/09/2017 revealed mild ascites decreased since prior study.  Previously seen left omental soft tissue mass has nearly completely  resolved now measuring 3.8 x 0.8 cm.  Previously seen right lower quadrant peritoneal mass is no longer visualized.  Complex cystic and solid mass in the central pelvis is decreased in size since the previous study currently measuring 9.4 x 8.3 cm.  The previously seen peritoneal tumor implant along the dome of the right hepatic lobe has nearly completely resolved since the previous study.  There is no lymphadenopathy.  Ca1 25 drawn on day 1 of cycle 3 of her chemotherapy has substantially reduced to 64.5.  Interval Hx:  On 07/24/17 she was admitted for a robotic assisted total hysterectomy, BSO, omentectomy, radical tumor debulking. She had minimal omental disease which was completely resected. She did still have an 8cm necrotic tumor mass on the left ovary adherent to the anterior wall of the rectum. The was resected with a thin rind of tumor remainng on the rectum. There was significant oozing at the surgical bed and thrombotic agents were used and blood was transfused postop. She was discharged on day 2.  She was readmitted on day 5 postop (07/29/17) with postop fevers. CT imaging showed stranding in the cul de sac consistent with infected hematoma, but not organized abscess. She was started on IV antibiotics (zosyn) which were continued until she remained afebrile x 2 days. On 08/02/16 she developed gross hematuria. A foley was placed. CT urogram confirmed a bladder injury. Dr Matilde Sprang from urology was consulted and the foley was continued.   She presents today for  follow-up.She reports some drainage from around the urethral meatus vs vagina that began approxiamtely 1 week after placement of foley (has been for 2 weeks). She denies fevers. She is weak. She has a improved appetite.  She feels wet when she stands up. There are times at night when she has no urine in the bag but has wetness on the pad. She is scheduled for cystogram today.  Review of Systems:  Constitutional  Feels less  fatigued Cardiovascular  No chest pain, shortness of breath, or edema  Pulmonary  No cough or wheeze.  Gastro Intestinal  No nausea, vomitting, or diarrhoea. No bright red blood per rectum, no abdominal pain, ports an episode of diarrhea last week change in bowel movement, or constipation or early satiety.  Genito Urinary  + leakage around urethra vs from vagina Musculo Skeletal  No myalgia, arthralgia, joint swelling reports twinges of the anterior abdominal wall Neurologic  No weakness, numbness, change in gait,  Psychology  No depression, anxiety, insomnia.    Current Meds:  Outpatient Encounter Medications as of 08/22/2017  Medication Sig  . acetaminophen (TYLENOL) 500 MG tablet Take 500 mg by mouth every 6 (six) hours as needed for mild pain or moderate pain.   . Calcium Carbonate-Vitamin D (CALCIUM + D PO) Take 1 tablet by mouth daily.   Marland Kitchen dexamethasone (DECADRON) 4 MG tablet Take 5 tabs the evening before chemo and 5 tabs the morning of chemo, every 21 days by mouth with pills  . lidocaine-prilocaine (EMLA) cream Apply to affected area once (Patient taking differently: Apply 1 application topically as needed. Apply to affected area once)  . ondansetron (ZOFRAN) 8 MG tablet Take  1 tablet (8 mg total) by mouth every 8 (eight) hours as needed for refractory nausea / vomiting. Start on day 3 after chemo.  Marland Kitchen oxyCODONE (OXY IR/ROXICODONE) 5 MG immediate release tablet Take 1 tablet (5 mg total) by mouth every 4 (four) hours as needed for severe pain.  Marland Kitchen prochlorperazine (COMPAZINE) 10 MG tablet Take 1 tablet (10 mg total) every 6 (six) hours as needed by mouth (Nausea or vomiting).  . conjugated estrogens (PREMARIN) vaginal cream Place 1 Applicatorful vaginally at bedtime.   No facility-administered encounter medications on file as of 08/22/2017.     Allergy: No Known Allergies  Social Hx:   Social History   Socioeconomic History  . Marital status: Divorced    Spouse name: Not  on file  . Number of children: Not on file  . Years of education: Not on file  . Highest education level: Not on file  Social Needs  . Financial resource strain: Not on file  . Food insecurity - worry: Not on file  . Food insecurity - inability: Not on file  . Transportation needs - medical: Not on file  . Transportation needs - non-medical: Not on file  Occupational History  . Occupation: retired-- Manufacturing engineer  Tobacco Use  . Smoking status: Never Smoker  . Smokeless tobacco: Never Used  Substance and Sexual Activity  . Alcohol use: Yes    Alcohol/week: 0.6 oz    Types: 1 Cans of beer per week    Comment: no alcohol comsumption in past month   . Drug use: No  . Sexual activity: Not Currently    Partners: Male  Other Topics Concern  . Not on file  Social History Narrative  . Not on file    Past Surgical Hx:  Past Surgical History:  Procedure Laterality Date  . ABDOMINAL HYSTERECTOMY    . BREAST SURGERY Right    lumpectomy-benign  . CERVIX SURGERY     "freezing" dysplasia  . COLONOSCOPY    . DEBULKING N/A 07/24/2017   Procedure: RADICAL TUMOR DEBULKING;  Surgeon: Everitt Amber, MD;  Location: [facility];  Service: Gynecology;  Laterality: N/A;  . HYSTEROSCOPY    . IR FLUORO GUIDE PORT INSERTION RIGHT  05/15/2017  . IR US GUIDE VASC ACCESS RIGHT  05/15/2017  . NEPHRECTOMY  01/1998   right--donation  . OMENTECTOMY N/A 07/24/2017   Procedure: OMENTECTOMY;  Surgeon: Everitt Amber, MD;  Location: [facility];  Service: Gynecology;  Laterality: N/A;  . ROBOTIC ASSISTED TOTAL HYSTERECTOMY WITH BILATERAL SALPINGO OOPHERECTOMY N/A 07/24/2017   Procedure: XI ROBOTIC ASSISTED TOTAL HYSTERECTOMY WITH BILATERAL SALPINGO OOPHORECTOMY;  Surgeon: Everitt Amber, MD;  Location: [facility];  Service: Gynecology;  Laterality: N/A;  . SHOULDER ARTHROSCOPY Left    "impingement" "frozen shoulder"  . SHOULDER ARTHROSCOPY WITH SUBACROMIAL DECOMPRESSION Right 10/23/2013   Procedure: RIGHT SHOULDER ARTHROSCOPY  WITH SUBACROMIAL DECOMPRESSION, EVULATION UNDER ANESTHESIA  AND MANIPULATION UNDER ANESTHESIA;  Surgeon: Johnn Hai, MD;  Location: [facility];  Service: Orthopedics;  Laterality: Right;    Past Medical Hx:  Past Medical History:  Diagnosis Date  . Anxiety    claustrophobia, fear of heights  . Cancer ([place])    ovarian cancer   . Complication of anesthesia    remembers severe sore throat after kidney surgery and feeling dizzy  . 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 Left shoulder pain   . Osteoporosis   . Premature atrial complexes    see  EKG in epic   . Skin lesion    "pre-cancer" -past hx.    Past Gynecological History: Gravida 5 para 4 menopause occurred 16 years ago after hysteroscopy for abnormal bleeding remote history of abnormal Pap tests treated with cryotherapy  Family Hx:  Family History  Problem Relation Age of Onset  . Heart disease Mother        mitral valve prolapse  . Hypertension Mother   . Hyperlipidemia Mother   . Cancer Mother 17       breast  . Osteopenia Mother   . Heart disease Father        bypass  . Hypertension Father   . Hyperlipidemia Father   . Cancer Father 13       bladder,  prostate  . Hyperlipidemia Sister   . Colon cancer Maternal Grandfather     Vitals:  Blood pressure 127/80, pulse 79, temperature 97.8 F (36.6 C), temperature source Oral, resp. rate 20, SpO2 100 %.  Physical Exam: WD in NAD Neck  Supple NROM, without any enlargements.  Lymph Node Survey No cervical supraclavicular or inguinal adenopathy Cardiovascular  Pulse normal rate, regularity and rhythm. S1 and S2 normal.  Lungs  Clear to auscultation bilaterally, without wheezes/crackles/rhonchi. Good air movement.  Skin  No rash/lesions/breakdown  Psychiatry  Alert and oriented appropriate mood affect speech and reasoning. Abdomen  Normoactive bowel sounds, abdomen soft, non-tender.  No masses. Incisions clean and in tact and no hematomas or  infection. Right lower quadrant incision with palpable small ballotable/reducible fluid collection below it.  Back No CVA tenderness Genito Urinary  Vulva/vagina: There is a small opening of the vaginal mucosa at the left apex. There is no evisceration. There is fluid (opaque, pale brown) that emanates from this defect. No bleeding, no lesions. No gross hematuria. The foley bag has clear yellow urine with minimal sediment. Rectal  deferred Extremities  No bilateral cyanosis, clubbing or edema.   [name], MD, PhD 08/22/2017, 11:27 AM

## 2017-08-22 NOTE — Assessment & Plan Note (Signed)
She has bladder catheter in situ If Foley cannot be removed soon, I will discuss with urologist and GYN oncologist to see if further delay of chemotherapy is needed or not

## 2017-08-22 NOTE — Patient Instructions (Signed)
Please follow-up with Dr Matilde Sprang as scheduled this afternoon after your cystogram. He will let you know if there is adequate healing of the bladder such that the catheter can be removed.  Dr Denman George has prescribed you premarin vaginal cream that you should apply nightly to the vagina for 3 months to aid in healing of the top of the vagina.   You should plan on receiving chemotherapy with Dr Alvy Bimler in 2 weeks as scheduled.

## 2017-08-22 NOTE — Assessment & Plan Note (Signed)
She has persistent residual peripheral neuropathy. I will plan on minor dose adjustment of Taxol

## 2017-08-22 NOTE — Telephone Encounter (Signed)
Spoke to patient regarding upcoming march appointments.  °

## 2017-08-22 NOTE — Assessment & Plan Note (Signed)
Her wound appears to be healing well She will be seen by urologist to get Foley catheter removed soon The patient appears frail Due to recent bladder injury, I recommend another week of break and plan to start her back on chemotherapy the first week of March I plan to repeat blood work to make sure she is not anemic and her kidney function is stable I will refer her to see genetic counselor She will continue premedication dexamethasone prior to chemotherapy followed by G-CSF support Plan to see her back again at the end of March prior to cycle 5 of therapy

## 2017-08-22 NOTE — Assessment & Plan Note (Signed)
Her anemia is improving and iron studies are satisfactory I recommend increase oral intake with iron rich diet Due to history of severe constipation, it is unlikely that she will be able to tolerate oral iron supplement. I will monitor her blood counts closely.

## 2017-08-22 NOTE — Progress Notes (Signed)
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[facility]) Her wound appears to be healing well She will be seen by urologist to get Foley catheter removed soon The patient appears frail Due to recent bladder injury, I recommend another week of break and plan to start her back on chemotherapy the first week of March I plan to repeat blood work to make sure she is not anemic and her kidney function is stable I will refer her to see genetic counselor She will continue premedication dexamethasone prior to chemotherapy followed by G-CSF support Plan to see her back again at the end of March prior to cycle 5 of therapy  Anemia due to antineoplastic chemotherapy Her anemia is improving and iron studies are satisfactory I recommend increase oral intake with iron rich diet Due to history of severe constipation, it is unlikely that she will be able to tolerate oral iron supplement. I will monitor her blood counts closely.  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 I will plan on minor dose adjustment of Taxol  Anorexia Patient appears thin and cachectic I recommend increase oral intake as tolerated.  Bladder injury, initial encounter She has bladder catheter in situ If Foley cannot be removed soon, I will discuss with urologist and GYN oncologist to see if further delay of chemotherapy is needed or not   Orders Placed This Encounter  Procedures  . Ambulatory referral to Genetics    Referral Priority:   Routine    Referral Type:   Consultation    Referral Reason:   Specialty Services Required    Number of Visits Requested:   1    INTERVAL HISTORY: Please see below for problem oriented charting. I have reviewed her chart extensively. The patient underwent interval debulking surgery, complicated by  severe anemia and bladder injury She returns today with friends and family for further follow-up She still have Foley catheter in situ but to be removed hopefully today Her energy level is fair She feels stronger since she was last seen here She denies recent infection No recent nausea, vomiting, changes in bowel habits or abdominal bloating No recent fever or chills She complained of persistent peripheral neuropathy from prior chemotherapy The patient denies any recent signs or symptoms of bleeding such as spontaneous epistaxis, hematuria or hematochezia.  SUMMARY OF ONCOLOGIC HISTORY:   Left ovarian epithelial cancer ([place])   04/24/2017 Initial Diagnosis    She reports a gradual sensation of pelvic heaviness which she thought might be related to genital prolapse.  She presented for evaluation of an ultrasound was performed which demonstrated a uterus measuring 6 x 5 cm with a 1.3 mm endometrial stripe.  Multiple heterogeneous pelvic masses were appreciated the right measured 8 x 5 cm and another measured 10 cm just posterior to the uterus CA-125 was obtained and returned a value of 404.8. She also reports early satiety for approximately 2 months and a 10 pound weight loss over the last year.  She also reports mild abdominal bloating and sensation of pelvic heaviness      04/25/2017 Imaging    Ct abdomen and pelvis: 1. Appearance compatible with ovarian malignancy with widespread peritoneal, mesenteric, and omental metastatic disease. Specifically, a 12.7 cm primarily solid and partially cystic mass above the uterus probably arises from the right ovary and is associated with omental caking,  complex ascites with peritoneal metastatic deposits, and mesenteric deposits of tumor. 2. Mild intrahepatic biliary dilatation. The extrahepatic biliary tree is within normal limits. 3.  Aortic Atherosclerosis (ICD10-I70.0).      05/01/2017 Procedure    Successful CT-guided left abdominal omental mass 18  gauge core biopsies      05/01/2017 Pathology Results    Soft Tissue Needle Core Biopsy, Left omental - METASTATIC CARCINOMA, SEE COMMENT. 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 The catheter is ready for immediate use.      05/16/2017 -  Chemotherapy    She received neoadjuvant chemo with carbo/taxol x 3 cycles followed by 3 more cycles of chemo      06/25/2017 Tumor Marker    Patient's tumor was tested for the following markers: CA-125 Results of the tumor marker test revealed 64.5      07/09/2017 Imaging    Decreased size of cystic and solid central pelvic mass since previous study.  Near complete resolution of peritoneal metastatic disease and ascites since previous study.  No new or progressive metastatic disease identified. No evidence of metastatic disease within the thorax.  Large stool burden noted; recommend clinical correlation for possible constipation.      07/24/2017 Surgery    Operation: Robotic-assisted laparoscopic total hysterectomy with bilateral salpingoophorectomy, omentectomy, radical tumor debulking  Surgeon: [name]  Operative Findings:  : 3cm left omental/splenic flexure tumor nodule adherent to transverse colon. 10cm pelvic tumor from left ovary, adherent to uterus and rectum, necrotic. Left retroperitoneal infiltration.  Tumor rind remaining on posterior cul de sac and distal rectum - very thin, <1cm thick, representing optimal cytoreductive procedure.         07/24/2017 Pathology Results    1. Omentum, resection for tumor - METASTATIC CARCINOMA. 2. Uterus +/- tubes/ovaries, neoplastic - LEFT OVARY: HIGH GRADE SEROUS CARCINOMA, SPANNING APPROXIMATELY 9.9 CM. TUMOR INVOLVES SURFACE. TREATMENT EFFECT PRESENT. SEE ONCOLOGY TABLE. - RIGHT OVARY: INVOLVED BY HIGH  GRADE SEROUS CARCINOMA. - BILATERAL FALLOPIAN TUBES: INVOLVED BY HIGH GRADE SEROUS CARCINOMA. - UTERUS: -ENDOMETRIUM: ENDOMETRIAL TYPE POLYP. INACTIVE ENDOMETRIUM. NO HYPERPLASIA OR MALIGNANCY. -MYOMETRIUM: LEIOMYOMATA. NO MALIGNANCY. -SEROSA: INVOLVED BY HIGH GRADE SEROUS CARCINOMA. - CERVIX: HIGH GRADE SEROUS CARCINOMA PRESENT AT ANTERIOR CERVICAL SOFT TISSUE MARGIN. Microscopic Comment 2. OVARY Specimen(s): Uterus, cervix, bilateral ovaries and fallopian tubes, omentum. Procedure: (including lymph node sampling): Total hysterectomy with bilateral salpingo-oophorectomy. Omentectomy. Primary tumor site (including laterality): Left ovary. Ovarian surface involvement: Present. Ovarian capsule intact without fragmentation: Disrupted. Maximum tumor size (cm): Estimated 9.9 cm. Histologic type: High grade serous carcinoma. Grade: High grade (FIGO N/A) Peritoneal implants: (specify invasive or non-invasive): Present (>2 cm) as in part #1. Pelvic extension (list additional structures on separate lines and if involved): Right ovary, bilateral fallopian tubes, cervical soft tissue. Lymph nodes: number examined 0 ; number positive 0 TNM code: ypT3c, ypNX, ypMX FIGO Stage (based on pathologic findings, needs clinical correlation): IIIC Comments: None.      07/24/2017 - 07/26/2017 Hospital Admission    She was admitted to the hospital for surgical debulking, complicated by anemia requiring blood transfusion.      07/29/2017 - 08/02/2017 Hospital Admission    She was admitted to the hospital for management of hematoma and bladder injury.      07/29/2017 Imaging    1. Blood products and large volume  hemostatic material in the pelvis correlating with operative history. Sterility is indeterminate by imaging. No organized collection for drainage. 2. High-density posterior to the pelvic collection was present preoperatively and is attributed to mass calcification. No bowel leak is seen with oral  contrast reaching the transverse colon. 3. Diffuse postoperative subcutaneous gas. Small hematomas at bilateral abdominal wall port sites. 4. Right nephrectomy.       07/31/2017 Procedure    Mild left hydronephrosis      08/01/2017 Imaging    Extravasation contrast from the posterior aspect of the bladder into the adjacent tissues       REVIEW OF SYSTEMS:   Constitutional: Denies fevers, chills or abnormal weight loss Eyes: Denies blurriness of vision Ears, nose, mouth, throat, and face: Denies mucositis or sore throat Respiratory: Denies cough, dyspnea or wheezes Cardiovascular: Denies palpitation, chest discomfort or lower extremity swelling Gastrointestinal:  Denies nausea, heartburn or change in bowel habits Skin: Denies abnormal skin rashes Lymphatics: Denies new lymphadenopathy or easy bruising Behavioral/Psych: Mood is stable, no new changes  All other systems were reviewed with the patient and are negative.  I have reviewed the past medical history, past surgical history, social history and family history with the patient and they are unchanged from previous note.  ALLERGIES:  has No Known Allergies.  MEDICATIONS:  Current Outpatient Medications  Medication Sig Dispense Refill  . acetaminophen (TYLENOL) 500 MG tablet Take 500 mg by mouth every 6 (six) hours as needed for mild pain or moderate pain.     . Calcium Carbonate-Vitamin D (CALCIUM + D PO) Take 1 tablet by mouth daily.     Marland Kitchen conjugated estrogens (PREMARIN) vaginal cream Place 1 Applicatorful vaginally at bedtime. 42.5 g 12  . dexamethasone (DECADRON) 4 MG tablet Take 5 tabs the evening before chemo and 5 tabs the morning of chemo, every 21 days by mouth with pills 30 tablet 1  . lidocaine-prilocaine (EMLA) cream Apply to affected area once (Patient taking differently: Apply 1 application topically as needed. Apply to affected area once) 30 g 3  . ondansetron (ZOFRAN) 8 MG tablet Take 1 tablet (8 mg total) by  mouth every 8 (eight) hours as needed for refractory nausea / vomiting. Start on day 3 after chemo. 30 tablet 1  . oxyCODONE (OXY IR/ROXICODONE) 5 MG immediate release tablet Take 1 tablet (5 mg total) by mouth every 4 (four) hours as needed for severe pain. 60 tablet 0  . prochlorperazine (COMPAZINE) 10 MG tablet Take 1 tablet (10 mg total) every 6 (six) hours as needed by mouth (Nausea or vomiting). 30 tablet 1   No current facility-administered medications for this visit.     PHYSICAL EXAMINATION: ECOG PERFORMANCE STATUS: 1 - Symptomatic but completely ambulatory  Vitals:   08/22/17 0848  BP: (!) 121/59  Pulse: 80  Resp: 18  Temp: 97.8 F (36.6 C)  SpO2: 100%   Filed Weights   08/22/17 0848  Weight: 95 lb 11.2 oz (43.4 kg)    GENERAL:alert, no distress and comfortable.  She looks thin and cachectic SKIN: skin color, texture, turgor are normal, no rashes or significant lesions EYES: normal, Conjunctiva are pink and non-injected, sclera clear OROPHARYNX:no exudate, no erythema and lips, buccal mucosa, and tongue normal  NECK: supple, thyroid normal size, non-tender, without nodularity LYMPH:  no palpable lymphadenopathy in the cervical, axillary or inguinal LUNGS: clear to auscultation and percussion with normal breathing effort HEART: regular rate & rhythm and no murmurs and no lower  extremity edema ABDOMEN:abdomen soft, non-tender and normal bowel sounds.  Well-healed surgical scar Musculoskeletal:no cyanosis of digits and no clubbing  NEURO: alert & oriented x 3 with fluent speech, no focal motor/sensory deficits  LABORATORY DATA:  I have reviewed the data as listed    Component Value Date/Time   NA 141 08/22/2017 0925   NA 140 06/25/2017 1003   K 4.2 08/22/2017 0925   K 4.6 06/25/2017 1003   CL 103 08/22/2017 0925   CO2 30 (H) 08/22/2017 0925   CO2 29 06/25/2017 1003   GLUCOSE 71 08/22/2017 0925   GLUCOSE 94 06/25/2017 1003   GLUCOSE 81 05/18/2006 1112   BUN 15  08/22/2017 0925   BUN 14.8 06/25/2017 1003   CREATININE 0.86 08/22/2017 0925   CREATININE 0.9 06/25/2017 1003   CALCIUM 9.7 08/22/2017 0925   CALCIUM 9.5 06/25/2017 1003   PROT 7.0 08/22/2017 0925   PROT 7.0 06/25/2017 1003   ALBUMIN 3.5 08/22/2017 0925   ALBUMIN 3.7 06/25/2017 1003   AST 16 08/22/2017 0925   AST 17 06/25/2017 1003   ALT 8 08/22/2017 0925   ALT 11 06/25/2017 1003   ALKPHOS 55 08/22/2017 0925   ALKPHOS 88 06/25/2017 1003   BILITOT 0.2 08/22/2017 0925   BILITOT 0.22 06/25/2017 1003   GFRNONAA >60 08/22/2017 0925   GFRAA >60 08/22/2017 0925    No results found for: SPEP, UPEP  Lab Results  Component Value Date   WBC 6.0 08/22/2017   NEUTROABS 4.2 08/22/2017   HGB 9.9 (L) 08/22/2017   HCT 29.9 (L) 08/22/2017   MCV 92.4 08/22/2017   PLT 236 08/22/2017      Chemistry      Component Value Date/Time   NA 141 08/22/2017 0925   NA 140 06/25/2017 1003   K 4.2 08/22/2017 0925   K 4.6 06/25/2017 1003   CL 103 08/22/2017 0925   CO2 30 (H) 08/22/2017 0925   CO2 29 06/25/2017 1003   BUN 15 08/22/2017 0925   BUN 14.8 06/25/2017 1003   CREATININE 0.86 08/22/2017 0925   CREATININE 0.9 06/25/2017 1003      Component Value Date/Time   CALCIUM 9.7 08/22/2017 0925   CALCIUM 9.5 06/25/2017 1003   ALKPHOS 55 08/22/2017 0925   ALKPHOS 88 06/25/2017 1003   AST 16 08/22/2017 0925   AST 17 06/25/2017 1003   ALT 8 08/22/2017 0925   ALT 11 06/25/2017 1003   BILITOT 0.2 08/22/2017 0925   BILITOT 0.22 06/25/2017 1003       RADIOGRAPHIC STUDIES: I have personally reviewed the radiological images as listed and agreed with the findings in the report. Ct Abdomen Pelvis Wo Contrast  Result Date: 07/29/2017 CLINICAL DATA:  Abdominal distention. Fever. Hysterectomy on Tuesday. Ovarian cancer. EXAM: CT ABDOMEN AND PELVIS WITHOUT CONTRAST TECHNIQUE: Multidetector CT imaging of the abdomen and pelvis was performed following the standard protocol without IV contrast.  COMPARISON:  07/09/2017 FINDINGS: Lower chest: Extensive chest wall emphysema. Crepitus was noted since surgery in this patient with minimal invasive procedure. Porta catheter is seen at the upper cavoatrial junction. Hepatobiliary: No focal liver abnormality.No evidence of biliary obstruction or stone. Pancreas: Unremarkable. Spleen: Unremarkable. Adrenals/Urinary Tract: Negative adrenals. Right nephrectomy. Negative left kidney when accounting for renal sinus cysts. Gas in the urinary bladder, nonspecific. Stomach/Bowel: Oral contrast reached the proximal colon. No leak is suspected. There is mild wall thickening of bowel loops in the right lower quadrant, likely secondary given proximity to the pelvic clot.  Negative for ileus or obstruction. Vascular/Lymphatic: Pelvic lymphadenectomy. Atherosclerotic calcification. Reproductive:Hysterectomy. There is a large amount of micro bubbles in the pelvis admixed with intermediate and high-density material, correlating with surgical note of large volume of hemostatic material usage. Small volume hemorrhage tracks into the low left paracolic gutter. No drainable fluid collection is seen. High-density posteriorly along this collection is has the same morphology is calcifications seen along the posterior pelvic mass on the preoperative study. The fat in the pelvis is stranded. Other: There is peritoneal nodularity on preceding abdominal CT, not well seen on this noncontrast study. Musculoskeletal: Anasarca and widespread subcutaneous gas. There are small high-density subcutaneous and intramuscular nodular areas along the bilateral flanks consistent with hemorrhage at the port sites. These measure up to 2 cm. IMPRESSION: 1. Blood products and large volume hemostatic material in the pelvis correlating with operative history. Sterility is indeterminate by imaging. No organized collection for drainage. 2. High-density posterior to the pelvic collection was present preoperatively  and is attributed to mass calcification. No bowel leak is seen with oral contrast reaching the transverse colon. 3. Diffuse postoperative subcutaneous gas. Small hematomas at bilateral abdominal wall port sites. 4. Right nephrectomy. Electronically Signed   By: Monte Fantasia M.D.   On: 07/29/2017 15:50   Dg Chest 2 View  Result Date: 07/29/2017 CLINICAL DATA:  Sepsis and fever. Postoperative day 5 from total abdominal hysterectomy and tumor debulking. EXAM: CHEST  2 VIEW COMPARISON:  CT chest 07/09/2017. FINDINGS: Lungs are hyperexpanded. The lungs are clear without focal pneumonia, edema, pneumothorax or pleural effusion. The cardiopericardial silhouette is within normal limits for size. The visualized bony structures of the thorax are intact. There is prominent subcutaneous emphysema throughout the anterior chest wall bilaterally. Scapula project over the lung apices bilaterally, but no definite pneumothorax can be identified. Right Port-A-Cath tip overlies the distal SVC. IMPRESSION: 1. Extensive subcutaneous emphysema involving the anterior chest wall bilaterally extending up into the neck. No pneumothorax can be identified on the current study. Crepitus in the chest was documented postoperatively in EPIC notes dated 07/25/2017. 2. Otherwise no acute cardiopulmonary findings. Electronically Signed   By: Misty Stanley M.D.   On: 07/29/2017 13:06   Dg Cystogram  Result Date: 08/01/2017 CLINICAL DATA:  Hematuria. Recent hysterectomy and bilateral salpingo oophorectomy for ovarian cancer. EXAM: CYSTOGRAM TECHNIQUE: After catheterization of the urinary bladder following sterile technique the bladder was filled with 100 mL Cysto-Hypaque 30% by drip infusion. Serial spot images were obtained during bladder filling and post draining. FLUOROSCOPY TIME:  Fluoroscopy Time:  1 minute 42 seconds Radiation Exposure Index (if provided by the fluoroscopic device): 27.69 mGy COMPARISON:  CT scans dated 07/09/2017 and  07/29/2017 FINDINGS: The scout image demonstrates a normal bowel gas pattern. Subcutaneous emphysema from recent surgery. Contrast was instilled in the bladder through the indwelling Foley catheter during direct fluoroscopic visualization. There is extravasation of contrast from the posterior aspect of the bladder extending to the left of midline. Contrast installation was terminated immediately after extravasation was identified. Multiple spot films in the AP, lateral, and oblique positions were obtained. The bladder was drained by gravity with no significant residual in the bladder. IMPRESSION: Extravasation contrast from the posterior aspect of the bladder into the adjacent tissues. Electronically Signed   By: Lorriane Shire M.D.   On: 08/01/2017 11:18   US Renal  Result Date: 07/31/2017 CLINICAL DATA:  Hematuria. Patient has history of prior right nephrectomy. EXAM: RENAL / URINARY TRACT ULTRASOUND COMPLETE COMPARISON:  Ultrasound of abdomen September 12, 2013 FINDINGS: Right Kidney: Surgically removed. Left Kidney: Length: 11.1 cm. Echogenicity within normal limits. No mass is visualized. There is mild left hydronephrosis. Bladder: Appears normal for degree of bladder distention. Foley catheter is identified. IMPRESSION: Mild left hydronephrosis. Electronically Signed   By: Abelardo Diesel M.D.   On: 07/31/2017 17:59    All questions were answered. The patient knows to call the clinic with any problems, questions or concerns. No barriers to learning was detected.  I spent 40 minutes counseling the patient face to face. The total time spent in the appointment was 55 minutes and more than 50% was on counseling and review of test results  Heath Lark, MD 08/22/2017 12:10 PM

## 2017-08-22 NOTE — Assessment & Plan Note (Signed)
Patient appears thin and cachectic I recommend increase oral intake as tolerated.

## 2017-08-23 LAB — CA 125: Cancer Antigen (CA) 125: 13.5 U/mL (ref 0.0–38.1)

## 2017-08-25 ENCOUNTER — Encounter (HOSPITAL_COMMUNITY): Payer: Self-pay | Admitting: Emergency Medicine

## 2017-08-25 ENCOUNTER — Inpatient Hospital Stay (HOSPITAL_COMMUNITY)
Admission: EM | Admit: 2017-08-25 | Discharge: 2017-08-27 | DRG: 864 | Disposition: A | Discharge status: Home / Self Care | Payer: Medicare Other | Attending: Gynecologic Oncology | Admitting: Gynecologic Oncology

## 2017-08-25 DIAGNOSIS — C562 Malignant neoplasm of left ovary: Secondary | ICD-10-CM | POA: Diagnosis present

## 2017-08-25 DIAGNOSIS — Z9221 Personal history of antineoplastic chemotherapy: Secondary | ICD-10-CM | POA: Diagnosis not present

## 2017-08-25 DIAGNOSIS — R5082 Postprocedural fever: Principal | ICD-10-CM | POA: Diagnosis present

## 2017-08-25 DIAGNOSIS — N739 Female pelvic inflammatory disease, unspecified: Secondary | ICD-10-CM

## 2017-08-25 DIAGNOSIS — N820 Vesicovaginal fistula: Secondary | ICD-10-CM

## 2017-08-25 DIAGNOSIS — S3720XA Unspecified injury of bladder, initial encounter: Secondary | ICD-10-CM | POA: Diagnosis present

## 2017-08-25 DIAGNOSIS — D6481 Anemia due to antineoplastic chemotherapy: Secondary | ICD-10-CM | POA: Diagnosis not present

## 2017-08-25 DIAGNOSIS — T451X5A Adverse effect of antineoplastic and immunosuppressive drugs, initial encounter: Secondary | ICD-10-CM | POA: Diagnosis not present

## 2017-08-25 LAB — CBC WITH DIFFERENTIAL/PLATELET
BASOS ABS: 0 10*3/uL (ref 0.0–0.1)
BASOS PCT: 0 %
EOS ABS: 0 10*3/uL (ref 0.0–0.7)
Eosinophils Relative: 0 %
HCT: 30 % — ABNORMAL LOW (ref 36.0–46.0)
Hemoglobin: 10.1 g/dL — ABNORMAL LOW (ref 12.0–15.0)
Lymphocytes Relative: 3 %
Lymphs Abs: 0.5 10*3/uL — ABNORMAL LOW (ref 0.7–4.0)
MCH: 31.2 pg (ref 26.0–34.0)
MCHC: 33.7 g/dL (ref 30.0–36.0)
MCV: 92.6 fL (ref 78.0–100.0)
Monocytes Absolute: 0.9 10*3/uL (ref 0.1–1.0)
Monocytes Relative: 5 %
NEUTROS ABS: 15.6 10*3/uL — AB (ref 1.7–7.7)
Neutrophils Relative %: 92 %
Platelets: 198 10*3/uL (ref 150–400)
RBC: 3.24 MIL/uL — ABNORMAL LOW (ref 3.87–5.11)
RDW: 14.8 % (ref 11.5–15.5)
WBC: 17 10*3/uL — ABNORMAL HIGH (ref 4.0–10.5)

## 2017-08-25 LAB — URINALYSIS, ROUTINE W REFLEX MICROSCOPIC
Bilirubin Urine: NEGATIVE
Glucose, UA: NEGATIVE mg/dL
Ketones, ur: 20 mg/dL — AB
NITRITE: POSITIVE — AB
Protein, ur: 100 mg/dL — AB
Specific Gravity, Urine: 1.028 (ref 1.005–1.030)
pH: 5 (ref 5.0–8.0)

## 2017-08-25 LAB — I-STAT CHEM 8, ED
BUN: 13 mg/dL (ref 6–20)
CHLORIDE: 102 mmol/L (ref 101–111)
Calcium, Ion: 1.11 mmol/L — ABNORMAL LOW (ref 1.15–1.40)
Creatinine, Ser: 0.8 mg/dL (ref 0.44–1.00)
GLUCOSE: 92 mg/dL (ref 65–99)
HCT: 31 % — ABNORMAL LOW (ref 36.0–46.0)
HEMOGLOBIN: 10.5 g/dL — AB (ref 12.0–15.0)
POTASSIUM: 3.3 mmol/L — AB (ref 3.5–5.1)
Sodium: 139 mmol/L (ref 135–145)
TCO2: 24 mmol/L (ref 22–32)

## 2017-08-25 LAB — COMPREHENSIVE METABOLIC PANEL
ALBUMIN: 3.2 g/dL — AB (ref 3.5–5.0)
ALK PHOS: 54 U/L (ref 38–126)
ALT: 8 U/L — ABNORMAL LOW (ref 14–54)
AST: 16 U/L (ref 15–41)
Anion gap: 12 (ref 5–15)
BILIRUBIN TOTAL: 0.8 mg/dL (ref 0.3–1.2)
BUN: 15 mg/dL (ref 6–20)
CALCIUM: 8.5 mg/dL — AB (ref 8.9–10.3)
CO2: 23 mmol/L (ref 22–32)
Chloride: 104 mmol/L (ref 101–111)
Creatinine, Ser: 0.88 mg/dL (ref 0.44–1.00)
GFR calc non Af Amer: >60 mL/min (ref >60)
GLUCOSE: 97 mg/dL (ref 65–99)
POTASSIUM: 3.4 mmol/L — AB (ref 3.5–5.1)
SODIUM: 139 mmol/L (ref 135–145)
TOTAL PROTEIN: 6.5 g/dL (ref 6.5–8.1)

## 2017-08-25 MED ORDER — POTASSIUM CHLORIDE IN NACL 20-0.9 MEQ/L-% IV SOLN
INTRAVENOUS | Status: DC
  Administered 2017-08-25 – 2017-08-27 (×4): via INTRAVENOUS
  Filled 2017-08-25 (×5): qty 1000

## 2017-08-25 MED ORDER — PIPERACILLIN-TAZOBACTAM 3.375 G IVPB
3.3750 g | Freq: Three times a day (TID) | INTRAVENOUS | Status: DC
  Administered 2017-08-26 – 2017-08-27 (×4): 3.375 g via INTRAVENOUS
  Filled 2017-08-25 (×7): qty 50

## 2017-08-25 MED ORDER — HYDROMORPHONE HCL 1 MG/ML IJ SOLN: 0.2000 mg | INTRAMUSCULAR | Status: DC | PRN

## 2017-08-25 MED ORDER — ESTROGENS, CONJUGATED 0.625 MG/GM VA CREA
1.0000 | TOPICAL_CREAM | Freq: Every day | VAGINAL | Status: DC
  Administered 2017-08-26: 1 via VAGINAL
  Filled 2017-08-25: qty 30

## 2017-08-25 MED ORDER — PIPERACILLIN-TAZOBACTAM 3.375 G IVPB: 3.3750 g | Freq: Three times a day (TID) | INTRAVENOUS | Status: DC

## 2017-08-25 MED ORDER — ONDANSETRON HCL 4 MG PO TABS: 4.0000 mg | ORAL_TABLET | Freq: Four times a day (QID) | ORAL | Status: DC | PRN

## 2017-08-25 MED ORDER — ONDANSETRON HCL 4 MG/2ML IJ SOLN: 4.0000 mg | Freq: Four times a day (QID) | INTRAMUSCULAR | Status: DC | PRN

## 2017-08-25 MED ORDER — ENOXAPARIN SODIUM 40 MG/0.4ML ~~LOC~~ SOLN
40.0000 mg | Freq: Every day | SUBCUTANEOUS | Status: DC
  Administered 2017-08-25 – 2017-08-26 (×2): 40 mg via SUBCUTANEOUS
  Filled 2017-08-25 (×2): qty 0.4

## 2017-08-25 MED ORDER — LACTATED RINGERS IV BOLUS (SEPSIS)
1000.0000 mL | Freq: Once | INTRAVENOUS | Status: AC
  Administered 2017-08-25: 1000 mL via INTRAVENOUS

## 2017-08-25 MED ORDER — ACETAMINOPHEN 500 MG PO TABS: 500.0000 mg | ORAL_TABLET | Freq: Four times a day (QID) | ORAL | Status: DC | PRN

## 2017-08-25 MED ORDER — SENNOSIDES-DOCUSATE SODIUM 8.6-50 MG PO TABS: 1.0000 | ORAL_TABLET | Freq: Every evening | ORAL | Status: DC | PRN

## 2017-08-25 MED ORDER — PIPERACILLIN-TAZOBACTAM 3.375 G IVPB 30 MIN
3.3750 g | Freq: Once | INTRAVENOUS | Status: AC
  Administered 2017-08-25: 3.375 g via INTRAVENOUS
  Filled 2017-08-25: qty 50

## 2017-08-25 MED ORDER — IBUPROFEN 400 MG PO TABS: 600.0000 mg | ORAL_TABLET | Freq: Four times a day (QID) | ORAL | Status: DC | PRN

## 2017-08-25 MED ORDER — OXYCODONE HCL 5 MG PO TABS: 5.0000 mg | ORAL_TABLET | ORAL | Status: DC | PRN

## 2017-08-25 MED ORDER — SIMETHICONE 80 MG PO CHEW
80.0000 mg | CHEWABLE_TABLET | Freq: Four times a day (QID) | ORAL | Status: DC | PRN
  Filled 2017-08-25: qty 1

## 2017-08-25 NOTE — ED Provider Notes (Signed)
[facility] DEPT Provider Note   CSN: [id number] Arrival date & time: 08/25/17  1501     History   Chief Complaint Chief Complaint  Patient presents with  . Fever    HPI Colleen Mckinney is a 72 y.o. female.  72 yo F with a cc of fever.  Started earlier today.  As high as 102.  Feeling crummy all day.  Denies abdominal pain, vomiting.  Has had some increased stooling.  Recently had a debulking surgery due to ovarian CA and had a post op infection.  Last abx about a week ago.  Has a vaginovesical fistula with indwelling foley, has noticed some darkening. Denies cough, congestion.  Denies vomiting.     The history is provided by the patient.  Fever   This is a new problem. The current episode started yesterday. The problem occurs constantly. The problem has not changed since onset.The maximum temperature noted was 102 to 102.9 F. The temperature was taken using an oral thermometer. Pertinent negatives include no chest pain, no vomiting, no congestion and no headaches. She has tried nothing for the symptoms. The treatment provided no relief.    Past Medical History:  Diagnosis Date  . Anxiety    claustrophobia, fear of heights  . Cancer ([place])    ovarian cancer   . Complication of anesthesia    remembers severe sore throat after kidney surgery and feeling dizzy  . 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 Left shoulder pain   . Osteoporosis   . Premature atrial complexes    see EKG in epic   . Skin lesion    "pre-cancer" -past hx.    Patient Active Problem List   Diagnosis Date Noted  . 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 Pelvic abscess in female 08/02/2017  . Bladder injury, initial encounter 08/02/2017  . Fever 07/29/2017  . Postoperative fever 07/29/2017  . Ovarian cancer ([place]) 07/24/2017  . 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 Anemia due to antineoplastic chemotherapy 06/06/2017  . Other constipation  06/06/2017  . Cancer associated pain 05/04/2017  . Anorexia 05/04/2017  . 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 Rib pain on right side 07/02/2015  . LOW BACK PAIN, ACUTE 12/04/2008    Past Surgical History:  Procedure Laterality Date  . ABDOMINAL HYSTERECTOMY    . BREAST SURGERY Right    lumpectomy-benign  . CERVIX SURGERY     "freezing" dysplasia  . COLONOSCOPY    . DEBULKING N/A 07/24/2017   Procedure: RADICAL TUMOR DEBULKING;  Surgeon: Everitt Amber, MD;  Location: [facility];  Service: Gynecology;  Laterality: N/A;  . HYSTEROSCOPY    . IR FLUORO GUIDE PORT INSERTION RIGHT  05/15/2017  . IR US GUIDE VASC ACCESS RIGHT  05/15/2017  . NEPHRECTOMY  01/1998   right--donation  . OMENTECTOMY N/A 07/24/2017   Procedure: OMENTECTOMY;  Surgeon: Everitt Amber, MD;  Location: [facility];  Service: Gynecology;  Laterality: N/A;  . ROBOTIC ASSISTED TOTAL HYSTERECTOMY WITH BILATERAL SALPINGO OOPHERECTOMY N/A 07/24/2017   Procedure: XI ROBOTIC ASSISTED TOTAL HYSTERECTOMY WITH BILATERAL SALPINGO OOPHORECTOMY;  Surgeon: Everitt Amber, MD;  Location: [facility];  Service: Gynecology;  Laterality: N/A;  . SHOULDER ARTHROSCOPY Left    "impingement" "frozen shoulder"  . SHOULDER ARTHROSCOPY WITH SUBACROMIAL DECOMPRESSION Right 10/23/2013   Procedure: RIGHT SHOULDER ARTHROSCOPY WITH SUBACROMIAL DECOMPRESSION, EVULATION UNDER ANESTHESIA  AND MANIPULATION UNDER ANESTHESIA;  Surgeon: Johnn Hai, MD;  Location: [facility];  Service: Orthopedics;  Laterality: Right;    OB History    No data available       Home Medications    Prior to Admission medications   Medication Sig Start Date End Date Taking? Authorizing Provider  acetaminophen (TYLENOL) 500 MG tablet Take 500 mg by mouth every 6 (six) hours as needed for mild pain or moderate pain.    Yes [provider]  Calcium Carbonate-Vitamin D (CALCIUM + D PO) Take 1 tablet by mouth daily.    Yes  [provider]  conjugated estrogens (PREMARIN) vaginal cream Place 1 Applicatorful vaginally at bedtime. 08/22/17 11/20/17 Yes Everitt Amber, MD  dexamethasone (DECADRON) 4 MG tablet Take 5 tabs the evening before chemo and 5 tabs the morning of chemo, every 21 days by mouth with pills 05/04/17  Yes Gorsuch, Ni, MD  lidocaine-prilocaine (EMLA) cream Apply to affected area once Patient taking differently: Apply 1 application topically as needed. Apply to affected area once 05/04/17  Yes Gorsuch, Ni, MD  mirabegron ER (MYRBETRIQ) 25 MG TB24 tablet Take 25 mg by mouth daily.   Yes [provider]  ondansetron (ZOFRAN) 8 MG tablet Take 1 tablet (8 mg total) by mouth every 8 (eight) hours as needed for refractory nausea / vomiting. Start on day 3 after chemo. 05/04/17  Yes Gorsuch, Ni, MD  oxyCODONE (OXY IR/ROXICODONE) 5 MG immediate release tablet Take 1 tablet (5 mg total) by mouth every 4 (four) hours as needed for severe pain. 05/04/17  Yes Gorsuch, Ni, MD  Pegfilgrastim (NEULASTA [place]) Inject 1 Dose into the skin once.   Yes [provider]  prochlorperazine (COMPAZINE) 10 MG tablet Take 1 tablet (10 mg total) every 6 (six) hours as needed by mouth (Nausea or vomiting). 05/16/17   Heath Lark, MD    Family History Family History  Problem Relation Age of Onset  . Heart disease Mother        mitral valve prolapse  . Hypertension Mother   . Hyperlipidemia Mother   . Cancer Mother 34       breast  . Osteopenia Mother   . Heart disease Father        bypass  . Hypertension Father   . Hyperlipidemia Father   . Cancer Father 3       bladder,  prostate  . Hyperlipidemia Sister   . Colon cancer Maternal Grandfather     Social History Social History   Tobacco Use  . Smoking status: Never Smoker  . Smokeless tobacco: Never Used  Substance Use Topics  . Alcohol use: Yes    Alcohol/week: 0.6 oz    Types: 1 Cans of beer per week    Comment: no alcohol comsumption in  past month   . Drug use: No     Allergies   Patient has no known allergies.   Review of Systems Review of Systems  Constitutional: Positive for activity change and fever. Negative for chills.  HENT: Negative for congestion and rhinorrhea.   Eyes: Negative for redness and visual disturbance.  Respiratory: Negative for shortness of breath and wheezing.   Cardiovascular: Negative for chest pain and palpitations.  Gastrointestinal: Negative for nausea and vomiting.  Genitourinary: Negative for dysuria and urgency.  Musculoskeletal: Negative for arthralgias and myalgias.  Skin: Negative for pallor and wound.  Neurological: Positive for weakness (generalized). Negative for dizziness and headaches.     Physical Exam Updated Vital  Signs BP (!) 113/56   Pulse 89   Temp 97.8 F (36.6 C) (Oral)   Resp 18   SpO2 97%   Physical Exam  Constitutional: She is oriented to person, place, and time. She appears cachectic. No distress.  HENT:  Head: Normocephalic and atraumatic.  Eyes: EOM are normal. Pupils are equal, round, and reactive to light.  Neck: Normal range of motion. Neck supple.  Cardiovascular: Regular rhythm. Tachycardia present. Exam reveals no gallop and no friction rub.  No murmur heard. Pulmonary/Chest: Effort normal. She has no wheezes. She has no rales.  Abdominal: Soft. She exhibits no distension. There is no tenderness.  Musculoskeletal: She exhibits no edema or tenderness.  Neurological: She is alert and oriented to person, place, and time.  Skin: Skin is warm and dry. She is not diaphoretic.  Psychiatric: She has a normal mood and affect. Her behavior is normal.  Nursing note and vitals reviewed.    ED Treatments / Results  Labs (all labs ordered are listed, but only abnormal results are displayed) Labs Reviewed  CBC WITH DIFFERENTIAL/PLATELET - Abnormal; Notable for the following components:      Result Value   WBC 17.0 (*)    RBC 3.24 (*)    Hemoglobin  10.1 (*)    HCT 30.0 (*)    Neutro Abs 15.6 (*)    Lymphs Abs 0.5 (*)    All other components within normal limits  COMPREHENSIVE METABOLIC PANEL - Abnormal; Notable for the following components:   Potassium 3.4 (*)    Calcium 8.5 (*)    Albumin 3.2 (*)    ALT 8 (*)    All other components within normal limits  URINALYSIS, ROUTINE W REFLEX MICROSCOPIC - Abnormal; Notable for the following components:   Color, Urine AMBER (*)    APPearance CLOUDY (*)    Hgb urine dipstick MODERATE (*)    Ketones, ur 20 (*)    Protein, ur 100 (*)    Nitrite POSITIVE (*)    Leukocytes, UA LARGE (*)    Bacteria, UA RARE (*)    Squamous Epithelial / LPF 0-5 (*)    All other components within normal limits  I-STAT CHEM 8, ED - Abnormal; Notable for the following components:   Potassium 3.3 (*)    Calcium, Ion 1.11 (*)    Hemoglobin 10.5 (*)    HCT 31.0 (*)    All other components within normal limits    EKG  EKG Interpretation None       Radiology No results found.  Procedures Procedures (including critical care time)  Medications Ordered in ED Medications  lactated ringers bolus 1,000 mL (0 mLs Intravenous Stopped 08/25/17 2015)  piperacillin-tazobactam (ZOSYN) IVPB 3.375 g (0 g Intravenous Stopped 08/25/17 2059)     Initial Impression / Assessment and Plan / ED Course  I have reviewed the triage vital signs and the nursing notes.  Pertinent labs & imaging results that were available during my care of the patient were reviewed by me and considered in my medical decision making (see chart for details).     73 yo F with a cc of a fever. Recently post op and recently finished abx.  Possibility of continued pelvic infection, though no abdominal pain on exam. No other noted infectious symptoms though with indwelling foley and fistula.  Seen by her surgeon who evaluated her and recommended admission.   The patients results and plan were reviewed and discussed.   Any x-rays  performed  were independently reviewed by myself.   Differential diagnosis were considered with the presenting HPI.  Medications  lactated ringers bolus 1,000 mL (0 mLs Intravenous Stopped 08/25/17 2015)  piperacillin-tazobactam (ZOSYN) IVPB 3.375 g (0 g Intravenous Stopped 08/25/17 2059)    Vitals:   08/25/17 2027 08/25/17 2030 08/25/17 2120 08/25/17 2216  BP: 118/63 109/61 101/61 (!) 113/56  Pulse: 91 90 85 89  Resp: 18 18 18 18   Temp:      TempSrc:      SpO2: 97% 100% 98% 97%    Final diagnoses:  Postoperative fever  Pelvic abscess in female    Admission/ observation were discussed with the admitting physician, patient and/or family and they are comfortable with the plan.    Final Clinical Impressions(s) / ED Diagnoses   Final diagnoses:  Postoperative fever  Pelvic abscess in female    ED Discharge Orders    None       Deno Etienne, DO 08/25/17 2231

## 2017-08-25 NOTE — ED Triage Notes (Signed)
Pt reports fever of 102 today took tylenol at 1230p.  Patient that had surgery for hysterectomy and removing tumors 4 weeks ago.  Patient starts chemo back up.

## 2017-08-25 NOTE — ED Notes (Signed)
Bed: WA23 Expected date:  Expected time:  Means of arrival:  Comments: 

## 2017-08-25 NOTE — H&P (Signed)
Consult Note: Gyn-Onc  CC:  Chief Complaint  Patient presents with  . Fever    Assessment/Plan:  Ms. Colleen Mckinney  is a 72 y.o.  year old with stage IV ovarian cancer, s/p neoadjuvant chemotherapy and interval debulking complicated by postoperative abscess and vesicovaginal fistula. Now with postoperative fevers and concern for urinary tract infection.  Given her high fevers, recent instrumentation of the bladder and impending chemotherapy, I am recommending admission to the hospital.  We will obtain labs in the ED with urine sent for culture.  IV hydration. IV zosyn.  If continues to spike fevers on IV antibiotics, would repeat imaging (given recent scan 2 days ago, I do not feel that this is necessary at this time, and I have concern for renal function with IV contrast given her history of a single kidney).   HPI: HPI: Ms. Colleen Mckinney is a 72 y.o. gravida 5 para 4 who reports a gradual sensation of pelvic heaviness which she thought might be related to genital prolapse.  She presented for evaluation of an ultrasound was performed which demonstrated a uterus measuring 6 x 5 cm with a 1.3 mm endometrial stripe.  Multiple heterogeneous pelvic masses were appreciated the right measured 8 x 5 cm and another measured 10 cm just posterior to the uterus CA-125 was obtained and returned a value of 404.8.  CT scan was obtained on April 25, 2017 and is notable for a capsular mass along the dome of the right hepatic lobe measuring 2.5 x 2.3 x 1.4 cm.  There are several tiny hypodense lesion lateral segment of the left hepatic lobe probably benign cysts.  The right kidney is surgically absent dorsal to the uterus there is a 12.7 x 7.1 x 7.3 cm primarily solid mass with cystic elements the mass is intimately associated with the uterus but also abuts the rectosigmoid extensive and omental caking was appreciated most notably in the left abdomen measuring up to 2.9 cm in thickness there was scattered  tumor in the mesentery in the right colon mesentery was noted to have a 2.9 x 2.2 cm mesenteric mass.  The appearance is compatible with widespread peritoneal and omental metastatic disease.  Ms. gentle reports early satiety for approximately 2 months and a 10 pound weight loss over the last year.  She also reports mild abdominal bloating and sensation of pelvic heaviness.  Her family history is notable for mother diagnosed with breast cancer at the age of 39 and maternal grandfather with colon cancer and a father who was diagnosed with bladder cancer in his 89s and prostate cancer in the 53s.  She underwent a colonoscopy approximately 3 years ago and is up-to-date on mammography.  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  On May 16, 2017 until June 25, 2017 she received 3 cycles of neoadjuvant carboplatin paclitaxel without issue.  Overall she feels well.  Her ascites has dramatically reduced.  She can eat well and denies abdominal pain.  She reports that her bowels are moving well and denies constipation.  CT scan of the abdomen and pelvis performed on 07/09/2017 revealed mild ascites decreased since prior study.  Previously seen left omental soft tissue mass has nearly completely resolved now measuring 3.8 x 0.8 cm.  Previously seen right lower  quadrant peritoneal mass is no longer visualized.  Complex cystic and solid mass in the central pelvis is decreased in size since the previous study currently measuring 9.4 x 8.3 cm.  The previously seen peritoneal tumor implant along the dome of the right hepatic lobe has nearly completely resolved since the previous study.  There is no lymphadenopathy.  Ca1 25 drawn on day 1 of cycle 3 of her chemotherapy has  substantially reduced to 64.5.  On 07/24/17 she was admitted for a robotic assisted total hysterectomy, BSO, omentectomy, radical tumor debulking. She had minimal omental disease which was completely resected. She did still have an 8cm necrotic tumor mass on the left ovary adherent to the anterior wall of the rectum. The was resected with a thin rind of tumor remainng on the rectum. There was significant oozing at the surgical bed and thrombotic agents were used and blood was transfused postop. She was discharged on day 2.  She was readmitted on day 5 postop (07/29/17) with postop fevers. CT imaging showed stranding in the cul de sac consistent with infected hematoma, but not organized abscess. She was started on IV antibiotics (zosyn) which were continued until she remained afebrile x 2 days. On 08/02/16 she developed gross hematuria. A foley was placed. CT urogram confirmed a bladder injury. Dr Matilde Sprang from urology was consulted and the foley was continued.   She began noting some drainage from the vagina that began approxiamtely 1 week after placement of foley (has been for 2 weeks).  CT cystogram on 08/22/17 showed a vesicovaginal fistula (via a pelvic collection/urinoma). A plan was made for continued indwelling foley catheter and resumption of chemotherapy with plan for interval repair of bladder after completing therapy.   Interval History: Colleen Mckinney began having fevers (to 102 this morning). She feels general malaise, but no localizing symptoms. No pain in abdomen or pelvis. Decreased urine output from vaginal and foley bag. Urine may be a little darker.   Current Meds:   Current Facility-Administered Medications:  .  lactated ringers bolus 1,000 mL, 1,000 mL, Intravenous, Once, Deno Etienne, DO  Current Outpatient Medications:  .  acetaminophen (TYLENOL) 500 MG tablet, Take 500 mg by mouth every 6 (six) hours as needed for mild pain or moderate pain. , Disp: , Rfl:  .  Calcium  Carbonate-Vitamin D (CALCIUM + D PO), Take 1 tablet by mouth daily. , Disp: , Rfl:  .  conjugated estrogens (PREMARIN) vaginal cream, Place 1 Applicatorful vaginally at bedtime., Disp: 42.5 g, Rfl: 12 .  oxyCODONE (OXY IR/ROXICODONE) 5 MG immediate release tablet, Take 1 tablet (5 mg total) by mouth every 4 (four) hours as needed for severe pain., Disp: 60 tablet, Rfl: 0 .  dexamethasone (DECADRON) 4 MG tablet, Take 5 tabs the evening before chemo and 5 tabs the morning of chemo, every 21 days by mouth with pills, Disp: 30 tablet, Rfl: 1 .  lidocaine-prilocaine (EMLA) cream, Apply to affected area once (Patient taking differently: Apply 1 application topically as needed. Apply to affected area once), Disp: 30 g, Rfl: 3 .  ondansetron (ZOFRAN) 8 MG tablet, Take 1 tablet (8 mg total) by mouth every 8 (eight) hours as needed for refractory nausea / vomiting. Start on day 3 after chemo., Disp: 30 tablet, Rfl: 1 .  prochlorperazine (COMPAZINE) 10 MG tablet, Take 1 tablet (10 mg total) every 6 (six) hours as needed by mouth (Nausea or vomiting)., Disp: 30 tablet, Rfl: 1  Allergy: No Known Allergies  Social Hx:  Social History   Socioeconomic History  . Marital status: Divorced    Spouse name: Not on file  . Number of children: Not on file  . Years of education: Not on file  . Highest education level: Not on file  Social Needs  . Financial resource strain: Not on file  . Food insecurity - worry: Not on file  . Food insecurity - inability: Not on file  . Transportation needs - medical: Not on file  . Transportation needs - non-medical: Not on file  Occupational History  . Occupation: retired-- Manufacturing engineer  Tobacco Use  . Smoking status: Never Smoker  . Smokeless tobacco: Never Used  Substance and Sexual Activity  . Alcohol use: Yes    Alcohol/week: 0.6 oz    Types: 1 Cans of beer per week    Comment: no alcohol comsumption in past month   . Drug use: No  . Sexual activity: Not  Currently    Partners: Male  Other Topics Concern  . Not on file  Social History Narrative  . Not on file    Past Surgical Hx:  Past Surgical History:  Procedure Laterality Date  . ABDOMINAL HYSTERECTOMY    . BREAST SURGERY Right    lumpectomy-benign  . CERVIX SURGERY     "freezing" dysplasia  . COLONOSCOPY    . DEBULKING N/A 07/24/2017   Procedure: RADICAL TUMOR DEBULKING;  Surgeon: Everitt Amber, MD;  Location: [facility];  Service: Gynecology;  Laterality: N/A;  . HYSTEROSCOPY    . IR FLUORO GUIDE PORT INSERTION RIGHT  05/15/2017  . IR US GUIDE VASC ACCESS RIGHT  05/15/2017  . NEPHRECTOMY  01/1998   right--donation  . OMENTECTOMY N/A 07/24/2017   Procedure: OMENTECTOMY;  Surgeon: Everitt Amber, MD;  Location: [facility];  Service: Gynecology;  Laterality: N/A;  . ROBOTIC ASSISTED TOTAL HYSTERECTOMY WITH BILATERAL SALPINGO OOPHERECTOMY N/A 07/24/2017   Procedure: XI ROBOTIC ASSISTED TOTAL HYSTERECTOMY WITH BILATERAL SALPINGO OOPHORECTOMY;  Surgeon: Everitt Amber, MD;  Location: [facility];  Service: Gynecology;  Laterality: N/A;  . SHOULDER ARTHROSCOPY Left    "impingement" "frozen shoulder"  . SHOULDER ARTHROSCOPY WITH SUBACROMIAL DECOMPRESSION Right 10/23/2013   Procedure: RIGHT SHOULDER ARTHROSCOPY WITH SUBACROMIAL DECOMPRESSION, EVULATION UNDER ANESTHESIA  AND MANIPULATION UNDER ANESTHESIA;  Surgeon: Johnn Hai, MD;  Location: [facility];  Service: Orthopedics;  Laterality: Right;    Past Medical Hx:  Past Medical History:  Diagnosis Date  . Anxiety    claustrophobia, fear of heights  . Cancer ([place])    ovarian cancer   . Complication of anesthesia    remembers severe sore throat after kidney surgery and feeling dizzy  . 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 Left shoulder pain   . Osteoporosis   . Premature atrial complexes    see EKG in epic   . Skin lesion    "pre-cancer" -past hx.    Past Gynecological History:  Ovarian cancer No LMP recorded. Patient has had a  hysterectomy.  Family Hx:  Family History  Problem Relation Age of Onset  . Heart disease Mother        mitral valve prolapse  . Hypertension Mother   . Hyperlipidemia Mother   . Cancer Mother 12       breast  . Osteopenia Mother   . Heart disease Father        bypass  . Hypertension Father   . Hyperlipidemia Father   . Cancer Father  80       bladder,  prostate  . Hyperlipidemia Sister   . Colon cancer Maternal Grandfather     Review of Systems:  Constitutional  Feels general malaise ENT Normal appearing ears and nares bilaterally Skin/Breast  No rash, sores, jaundice, itching, dryness Cardiovascular  No chest pain, shortness of breath, or edema  Pulmonary  No cough or wheeze.  Gastro Intestinal  No nausea, vomitting, or diarrhoea. No bright red blood per rectum, no abdominal pain, change in bowel movement, or constipation.  Genito Urinary  + vaginal leakage of urine Musculo Skeletal  No myalgia, arthralgia, joint swelling or pain  Neurologic  No weakness, numbness, change in gait,  Psychology  No depression, anxiety, insomnia.   Vitals:  Blood pressure 98/61, pulse (!) 119, temperature 97.8 F (36.6 C), temperature source Oral, resp. rate 18, SpO2 100 %.  Physical Exam: WD in NAD Neck  Supple NROM, without any enlargements.  Lymph Node Survey No cervical supraclavicular or inguinal adenopathy Cardiovascular  Pulse tachy, regularity and rhythm. S1 and S2 normal.  Lungs  Clear to auscultation bilateraly, without wheezes/crackles/rhonchi. Good air movement.  Skin  No rash/lesions/breakdown  Psychiatry  Alert and oriented to person, place, and time  Abdomen  Normoactive bowel sounds, abdomen soft, non-tender and thin without evidence of hernia. Well healed incisions.  Back No CVA tenderness Genito Urinary : minimal urine in foley bag. Clear.  Vulva/vagina: deferred Rectal  deferred Extremities  No bilateral cyanosis, clubbing or edema.   [name], MD  08/25/2017, 5:17 PM

## 2017-08-26 ENCOUNTER — Other Ambulatory Visit: Payer: Self-pay

## 2017-08-26 LAB — CBC WITH DIFFERENTIAL/PLATELET
BASOS PCT: 0 %
Basophils Absolute: 0 10*3/uL (ref 0.0–0.1)
Eosinophils Absolute: 0.1 10*3/uL (ref 0.0–0.7)
Eosinophils Relative: 1 %
HEMATOCRIT: 26.1 % — AB (ref 36.0–46.0)
Hemoglobin: 8.8 g/dL — ABNORMAL LOW (ref 12.0–15.0)
LYMPHS ABS: 0.6 10*3/uL — AB (ref 0.7–4.0)
Lymphocytes Relative: 6 %
MCH: 31.9 pg (ref 26.0–34.0)
MCHC: 33.7 g/dL (ref 30.0–36.0)
MCV: 94.6 fL (ref 78.0–100.0)
MONO ABS: 0.4 10*3/uL (ref 0.1–1.0)
MONOS PCT: 4 %
Neutro Abs: 9.1 10*3/uL — ABNORMAL HIGH (ref 1.7–7.7)
Neutrophils Relative %: 89 %
Platelets: 188 10*3/uL (ref 150–400)
RBC: 2.76 MIL/uL — ABNORMAL LOW (ref 3.87–5.11)
RDW: 15 % (ref 11.5–15.5)
WBC: 10.2 10*3/uL (ref 4.0–10.5)

## 2017-08-26 LAB — C DIFFICILE QUICK SCREEN W PCR REFLEX
C DIFFICILE (CDIFF) TOXIN: NEGATIVE
C Diff antigen: NEGATIVE
C Diff interpretation: NOT DETECTED

## 2017-08-26 LAB — BASIC METABOLIC PANEL
Anion gap: 12 (ref 5–15)
BUN: 14 mg/dL (ref 6–20)
CALCIUM: 7.8 mg/dL — AB (ref 8.9–10.3)
CHLORIDE: 107 mmol/L (ref 101–111)
CO2: 21 mmol/L — AB (ref 22–32)
CREATININE: 0.94 mg/dL (ref 0.44–1.00)
GFR calc Af Amer: >60 mL/min (ref >60)
GFR calc non Af Amer: 59 mL/min — ABNORMAL LOW (ref >60)
GLUCOSE: 70 mg/dL (ref 65–99)
Potassium: 3.6 mmol/L (ref 3.5–5.1)
Sodium: 140 mmol/L (ref 135–145)

## 2017-08-26 MED ORDER — PHENAZOPYRIDINE HCL 100 MG PO TABS
100.0000 mg | ORAL_TABLET | Freq: Once | ORAL | Status: AC
  Administered 2017-08-26: 100 mg via ORAL
  Filled 2017-08-26: qty 1

## 2017-08-26 MED ORDER — FERROUS SULFATE 220 (44 FE) MG/5ML PO ELIX
220.0000 mg | ORAL_SOLUTION | Freq: Two times a day (BID) | ORAL | Status: DC
  Filled 2017-08-26: qty 5

## 2017-08-26 MED ORDER — FERROUS SULFATE 300 (60 FE) MG/5ML PO SYRP
300.0000 mg | ORAL_SOLUTION | Freq: Two times a day (BID) | ORAL | Status: DC
  Administered 2017-08-26 – 2017-08-27 (×2): 300 mg via ORAL
  Filled 2017-08-26 (×2): qty 5

## 2017-08-26 NOTE — Progress Notes (Signed)
Consult Note: Gyn-Onc  CC:  Chief Complaint  Patient presents with  . Fever    Assessment/Plan:  Ms. Colleen Mckinney  is a 72 y.o.  year old with stage IV ovarian cancer, s/p neoadjuvant chemotherapy and interval debulking complicated by postoperative abscess and vesicovaginal fistula. Now with postoperative fevers and concern for urinary tract infection. Additional concern for possible complex fistula with communication between bladder, vagina and colon (given watery diarrhea).  Strict I's and O's (including measuring volume of liquid stools).  Will test stool for c diff. Will give pyridium - if stools colored orange, this will be diagnostic (will then obtain imaging to confirm site of fistula). Would consider percutaneous nephrostomy tube to divert urine flow if this this the case.  From an infectious standpoint, Colleen Mckinney is doing much better. Afebrile for >24 hours, WBC normalized. Vital signs normalized.   Anemic- iron replacement therapy.  Discharge planning - anticipated discharge 2/25 vs 2/26 pending fever curve and whether or not PCN is necessary.   I had a lengthy discussion with Colleen Mckinney in which I communicated my concerns about what was going on. She asked questions about why this is happening. I discussed that there was development of events postoperatively suggesting thinning of the urologic structures which enabled delayed fistula formation made worse by the inflammatory process in the pelvis from infection. I discussed the importance of stabilizing things prior to starting chemotherapy. We discussed why, even in the absence of cancer, we do not operate to repair fistulae in the acute setting.  I expressed by regret about her complicated postoperative course and discussed that I was sorry that she developed these complications. Colleen Mckinney was very pleasant and understanding of my explanations.    HPI: HPI: Ms. Colleen Mckinney is a 72 y.o. gravida 5 para 4 who reports a gradual  sensation of pelvic heaviness which she thought might be related to genital prolapse.  She presented for evaluation of an ultrasound was performed which demonstrated a uterus measuring 6 x 5 cm with a 1.3 mm endometrial stripe.  Multiple heterogeneous pelvic masses were appreciated the right measured 8 x 5 cm and another measured 10 cm just posterior to the uterus CA-125 was obtained and returned a value of 404.8.  CT scan was obtained on April 25, 2017 and is notable for a capsular mass along the dome of the right hepatic lobe measuring 2.5 x 2.3 x 1.4 cm.  There are several tiny hypodense lesion lateral segment of the left hepatic lobe probably benign cysts.  The right kidney is surgically absent dorsal to the uterus there is a 12.7 x 7.1 x 7.3 cm primarily solid mass with cystic elements the mass is intimately associated with the uterus but also abuts the rectosigmoid extensive and omental caking was appreciated most notably in the left abdomen measuring up to 2.9 cm in thickness there was scattered tumor in the mesentery in the right colon mesentery was noted to have a 2.9 x 2.2 cm mesenteric mass.  The appearance is compatible with widespread peritoneal and omental metastatic disease.  Ms. gentle reports early satiety for approximately 2 months and a 10 pound weight loss over the last year.  She also reports mild abdominal bloating and sensation of pelvic heaviness.  Her family history is notable for mother diagnosed with breast cancer at the age of 61 and maternal grandfather with colon cancer and a father who was diagnosed with bladder cancer in his 16s and prostate cancer in the 21s.  She underwent a colonoscopy approximately 3 years ago and is up-to-date on mammography.  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  On May 01, 2017 she underwent a CT-guided biopsy of the omental mass which revealed metastatic carcinoma favoring high-grade serous  carcinoma consistent with a gynecologic primary.  On May 16, 2017 until June 25, 2017 she received 3 cycles of neoadjuvant carboplatin paclitaxel without issue.  Overall she feels well.  Her ascites has dramatically reduced.  She can eat well and denies abdominal pain.  She reports that her bowels are moving well and denies constipation.  CT scan of the abdomen and pelvis performed on 07/09/2017 revealed mild ascites decreased since prior study.  Previously seen left omental soft tissue mass has nearly completely resolved now measuring 3.8 x 0.8 cm.  Previously seen right lower quadrant peritoneal mass is no longer visualized.  Complex cystic and solid mass in the central pelvis is decreased in size since the previous study currently measuring 9.4 x 8.3 cm.  The previously seen peritoneal tumor implant along the dome of the right hepatic lobe has nearly completely resolved since the previous study.  There is no lymphadenopathy.  Ca1 25 drawn on day 1 of cycle 3 of her chemotherapy has substantially reduced to 64.5.  On 07/24/17 she was admitted for a robotic assisted total hysterectomy, BSO, omentectomy, radical tumor debulking. She had minimal omental disease which was completely resected. She did still have an 8cm necrotic tumor mass on the left ovary adherent to the anterior wall of the rectum. The was resected with a thin rind of tumor remainng on the rectum. There was significant oozing at the surgical bed and thrombotic agents were used and blood was transfused postop. She was discharged on day 2.  She was readmitted on day 5 postop (07/29/17) with postop fevers. CT imaging showed stranding in the cul de sac consistent with infected hematoma, but not organized abscess. She was started on IV antibiotics (zosyn) which were continued until she remained afebrile x 2 days. On 08/02/16 she developed gross hematuria. A foley was placed. CT urogram confirmed a bladder injury. Dr Matilde Sprang from  urology was consulted and the foley was continued.   She began noting some drainage from the vagina that began approxiamtely 1 week after placement of foley (has been for 2 weeks).  CT cystogram on 08/22/17 showed a vesicovaginal fistula (via a pelvic collection/urinoma). A plan was made for continued indwelling foley catheter and resumption of chemotherapy with plan for interval repair of bladder after completing therapy.   Interval History: Colleen Mckinney began having fevers (to 102 on 08/25/17. She was admitted to the hospital with presumed urinary tract infection and pelvic infection and was started on zosyn.  Her WBC was 17,000 on admission and 10,000 on HD 2. She has had no further fevers. She feels generally improved. Hb is anemic at 8.8 - iron deficiency anemia.  She has profuse watery diarrhea (clear, with greenish tint). No odor. She is nervous that it is urine that is coming from the rectum as she has little urine coming from the foley or vagina and is on IVF's. (ins and outs not being recorded therefore difficult to document this).    Current Meds:   Current Facility-Administered Medications:  .  0.9 % NaCl with KCl 20 mEq/ L  infusion, , Intravenous, Continuous, Everitt Amber, MD, Last Rate: 100 mL/hr at 08/26/17 1029 .  acetaminophen (TYLENOL) tablet 500  mg, 500 mg, Oral, Q6H PRN, Everitt Amber, MD .  conjugated estrogens (PREMARIN) vaginal cream 1 Applicatorful, 1 Applicatorful, Vaginal, QHS, Everitt Amber, MD .  enoxaparin (LOVENOX) injection 40 mg, 40 mg, Subcutaneous, QHS, Everitt Amber, MD, 40 mg at 08/25/17 2333 .  HYDROmorphone (DILAUDID) injection 0.2-0.6 mg, 0.2-0.6 mg, Intravenous, Q2H PRN, Everitt Amber, MD .  ibuprofen (ADVIL,MOTRIN) tablet 600 mg, 600 mg, Oral, Q6H PRN, Everitt Amber, MD .  ondansetron [facility]) tablet 4 mg, 4 mg, Oral, Q6H PRN **OR** ondansetron (ZOFRAN) injection 4 mg, 4 mg, Intravenous, Q6H PRN, Everitt Amber, MD .  oxyCODONE (Oxy IR/ROXICODONE) immediate release  tablet 5 mg, 5 mg, Oral, Q4H PRN, Everitt Amber, MD .  phenazopyridine (PYRIDIUM) tablet 100 mg, 100 mg, Oral, Once, Everitt Amber, MD .  piperacillin-tazobactam (ZOSYN) IVPB 3.375 g, 3.375 g, Intravenous, Q8H, Everitt Amber, MD, Last Rate: 12.5 mL/hr at 08/26/17 1417, 3.375 g at 08/26/17 1417 .  senna-docusate (Senokot-S) tablet 1 tablet, 1 tablet, Oral, QHS PRN, Everitt Amber, MD .  simethicone [facility]) chewable tablet 80 mg, 80 mg, Oral, QID PRN, Everitt Amber, MD  Allergy: No Known Allergies  Social Hx:   Social History   Socioeconomic History  . Marital status: Divorced    Spouse name: Not on file  . Number of children: Not on file  . Years of education: Not on file  . Highest education level: Not on file  Social Needs  . Financial resource strain: Not on file  . Food insecurity - worry: Not on file  . Food insecurity - inability: Not on file  . Transportation needs - medical: Not on file  . Transportation needs - non-medical: Not on file  Occupational History  . Occupation: retired-- Manufacturing engineer  Tobacco Use  . Smoking status: Never Smoker  . Smokeless tobacco: Never Used  Substance and Sexual Activity  . Alcohol use: Yes    Alcohol/week: 0.6 oz    Types: 1 Cans of beer per week    Comment: no alcohol comsumption in past month   . Drug use: No  . Sexual activity: Not Currently    Partners: Male  Other Topics Concern  . Not on file  Social History Narrative  . Not on file    Past Surgical Hx:  Past Surgical History:  Procedure Laterality Date  . ABDOMINAL HYSTERECTOMY    . BREAST SURGERY Right    lumpectomy-benign  . CERVIX SURGERY     "freezing" dysplasia  . COLONOSCOPY    . DEBULKING N/A 07/24/2017   Procedure: RADICAL TUMOR DEBULKING;  Surgeon: Everitt Amber, MD;  Location: [facility];  Service: Gynecology;  Laterality: N/A;  . HYSTEROSCOPY    . IR FLUORO GUIDE PORT INSERTION RIGHT  05/15/2017  . IR US GUIDE VASC ACCESS RIGHT  05/15/2017  . NEPHRECTOMY  01/1998    right--donation  . OMENTECTOMY N/A 07/24/2017   Procedure: OMENTECTOMY;  Surgeon: Everitt Amber, MD;  Location: [facility];  Service: Gynecology;  Laterality: N/A;  . ROBOTIC ASSISTED TOTAL HYSTERECTOMY WITH BILATERAL SALPINGO OOPHERECTOMY N/A 07/24/2017   Procedure: XI ROBOTIC ASSISTED TOTAL HYSTERECTOMY WITH BILATERAL SALPINGO OOPHORECTOMY;  Surgeon: Everitt Amber, MD;  Location: [facility];  Service: Gynecology;  Laterality: N/A;  . SHOULDER ARTHROSCOPY Left    "impingement" "frozen shoulder"  . SHOULDER ARTHROSCOPY WITH SUBACROMIAL DECOMPRESSION Right 10/23/2013   Procedure: RIGHT SHOULDER ARTHROSCOPY WITH SUBACROMIAL DECOMPRESSION, EVULATION UNDER ANESTHESIA  AND MANIPULATION UNDER ANESTHESIA;  Surgeon: Johnn Hai, MD;  Location: [facility];  Service: Orthopedics;  Laterality: Right;    Past Medical Hx:  Past Medical History:  Diagnosis Date  . Anxiety    claustrophobia, fear of heights  . Cancer ([place])    ovarian cancer   . Complication of anesthesia    remembers severe sore throat after kidney surgery and feeling dizzy  . 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 Left shoulder pain   . Osteoporosis   . Premature atrial complexes    see EKG in epic   . Skin lesion    "pre-cancer" -past hx.    Past Gynecological History:  Ovarian cancer No LMP recorded. Patient has had a hysterectomy.  Family Hx:  Family History  Problem Relation Age of Onset  . Heart disease Mother        mitral valve prolapse  . Hypertension Mother   . Hyperlipidemia Mother   . Cancer Mother 40       breast  . Osteopenia Mother   . Heart disease Father        bypass  . Hypertension Father   . Hyperlipidemia Father   . Cancer Father 78       bladder,  prostate  . Hyperlipidemia Sister   . Colon cancer Maternal Grandfather     Review of Systems:  Constitutional  Feels better generally, less malaise ENT Normal appearing ears and nares bilaterally Skin/Breast  No rash, sores, jaundice, itching,  dryness Cardiovascular  No chest pain, shortness of breath, or edema  Pulmonary  No cough or wheeze.  Gastro Intestinal  + frequent (>10 stools per day) watery diarrhea Genito Urinary  + vaginal leakage of urine (minimal) Musculo Skeletal  No myalgia, arthralgia, joint swelling or pain  Neurologic  No weakness, numbness, change in gait,  Psychology  No depression, anxiety, insomnia.   Vitals:  Blood pressure (!) 119/59, pulse 89, temperature 97.6 F (36.4 C), temperature source Oral, resp. rate 18, SpO2 100 %.  Physical Exam: WD in NAD Neck  Supple NROM, without any enlargements.  Lymph Node Survey No cervical supraclavicular or inguinal adenopathy Cardiovascular  Pulse normal rate, regularity and rhythm. S1 and S2 normal.  Lungs  Clear to auscultation bilateraly, without wheezes/crackles/rhonchi. Good air movement.  Skin  No rash/lesions/breakdown  Psychiatry  Alert and oriented to person, place, and time  Abdomen  Normoactive bowel sounds, abdomen soft, non-tender and thin without evidence of hernia. Well healed incisions.  Back No CVA tenderness Genito Urinary : moderate fluid in foley bag - clear with some sediment  Vulva/vagina: deferred Rectal  deferred Extremities  No bilateral cyanosis, clubbing or edema.   Colleen Eva, MD  08/26/2017, 2:33 PM

## 2017-08-26 NOTE — ED Notes (Signed)
ED TO INPATIENT HANDOFF REPORT  Name/Age/Gender Colleen Mckinney 72 y.o. female  Code Status    Code Status Orders  (From admission, onward)        Start     Ordered   08/25/17 2235  Full code  Continuous     08/25/17 2234    Code Status History    Date Active Date Inactive Code Status Order ID Comments User Context   07/29/2017 20:49 08/02/2017 15:56 Full Code [id number]  Lahoma Crocker, MD Inpatient   07/24/2017 18:46 07/26/2017 20:21 Full Code [id number]  Lahoma Crocker, MD Inpatient    Advance Directive Documentation     Most Recent Value  Type of Advance Directive  Healthcare Power of Attorney, Living will  Pre-existing out of facility DNR order (yellow form or pink MOST form)  No data  "MOST" Form in Place?  No data      Home/SNF/Other Home  Chief Complaint Fever after Surgery performed 4 weeks ago   Level of Care/Admitting Diagnosis ED Disposition    ED Disposition Condition [place]: [place] [[id number]]  Level of Care: Med-Surg [16]  Diagnosis: Postoperative fever [932355]  Admitting Physician: Everitt Amber [[id number]]  Attending Physician: Everitt Amber [[id number]]  Estimated length of stay: past midnight tomorrow  Certification:: I certify this patient will need inpatient services for at least 2 midnights  Bed request comments: 5 west  PT Class (Do Not Modify): Inpatient [101]  PT Acc Code (Do Not Modify): Private [1]       Medical History Past Medical History:  Diagnosis Date  . Anxiety    claustrophobia, fear of heights  . Cancer ([place])    ovarian cancer   . Complication of anesthesia    remembers severe sore throat after kidney surgery and feeling dizzy  . 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 Left shoulder pain   . Osteoporosis   . Premature atrial complexes    see EKG in epic   . Skin lesion    "pre-cancer" -past hx.    Allergies No Known Allergies  IV  Location/Drains/Wounds Patient Lines/Drains/Airways Status   Active Line/Drains/Airways    Name:   Placement date:   Placement time:   Site:   Days:   Implanted Port Right Chest   -    -    Chest      Urethral Catheter Donne Hazel, RN Straight-tip 14 Fr.   07/31/17    1722    Straight-tip   26   Incision (Closed) 07/24/17 Abdomen   07/24/17    1633     33   Incision (Closed) 07/24/17 Vagina   07/24/17    1633     33   Incision - 5 Ports Abdomen 1: Right;Lateral 2: Umbilicus 3: Left;Lateral;Upper 4: Left;Lateral;Mid 5: Left;Lateral;Lower   07/24/17    1340     33          Labs/Imaging Results for orders placed or performed during the hospital encounter of 08/25/17 (from the past 48 hour(s))  CBC with Differential     Status: Abnormal   Collection Time: 08/25/17  6:08 PM  Result Value Ref Range   WBC 17.0 (H) 4.0 - 10.5 K/uL   RBC 3.24 (L) 3.87 - 5.11 MIL/uL   Hemoglobin 10.1 (L) 12.0 - 15.0 g/dL   HCT 30.0 (L) 36.0 - 46.0 %   MCV 92.6 78.0 - 100.0 fL  MCH 31.2 26.0 - 34.0 pg   MCHC 33.7 30.0 - 36.0 g/dL   RDW 14.8 11.5 - 15.5 %   Platelets 198 150 - 400 K/uL   Neutrophils Relative % 92 %   Lymphocytes Relative 3 %   Monocytes Relative 5 %   Eosinophils Relative 0 %   Basophils Relative 0 %   Neutro Abs 15.6 (H) 1.7 - 7.7 K/uL   Lymphs Abs 0.5 (L) 0.7 - 4.0 K/uL   Monocytes Absolute 0.9 0.1 - 1.0 K/uL   Eosinophils Absolute 0.0 0.0 - 0.7 K/uL   Basophils Absolute 0.0 0.0 - 0.1 K/uL   Smear Review MORPHOLOGY UNREMARKABLE     Comment: Performed at [facility], [place] [address] [place], [place] [address]  Comprehensive metabolic panel     Status: Abnormal   Collection Time: 08/25/17  6:08 PM  Result Value Ref Range   Sodium 139 135 - 145 mmol/L   Potassium 3.4 (L) 3.5 - 5.1 mmol/L   Chloride 104 101 - 111 mmol/L   CO2 23 22 - 32 mmol/L   Glucose, Bld 97 65 - 99 mg/dL   BUN 15 6 - 20 mg/dL   Creatinine, Ser 0.88 0.44 - 1.00 mg/dL   Calcium 8.5 (L) 8.9  - 10.3 mg/dL   Total Protein 6.5 6.5 - 8.1 g/dL   Albumin 3.2 (L) 3.5 - 5.0 g/dL   AST 16 15 - 41 U/L   ALT 8 (L) 14 - 54 U/L   Alkaline Phosphatase 54 38 - 126 U/L   Total Bilirubin 0.8 0.3 - 1.2 mg/dL   GFR calc non Af Amer >60 >60 mL/min   GFR calc Af Amer >60 >60 mL/min    Comment: (NOTE) The eGFR has been calculated using the CKD EPI equation. This calculation has not been validated in all clinical situations. eGFR's persistently <60 mL/min signify possible Chronic Kidney Disease.    Anion gap 12 5 - 15    Comment: Performed at [facility], [place] [address], [place],  [address]  I-Stat Chem 8, ED     Status: Abnormal   Collection Time: 08/25/17  6:21 PM  Result Value Ref Range   Sodium 139 135 - 145 mmol/L   Potassium 3.3 (L) 3.5 - 5.1 mmol/L   Chloride 102 101 - 111 mmol/L   BUN 13 6 - 20 mg/dL   Creatinine, Ser 0.80 0.44 - 1.00 mg/dL   Glucose, Bld 92 65 - 99 mg/dL   Calcium, Ion 1.11 (L) 1.15 - 1.40 mmol/L   TCO2 24 22 - 32 mmol/L   Hemoglobin 10.5 (L) 12.0 - 15.0 g/dL   HCT 31.0 (L) 36.0 - 46.0 %  Urinalysis, Routine w reflex microscopic     Status: Abnormal   Collection Time: 08/25/17  7:58 PM  Result Value Ref Range   Color, Urine AMBER (A) YELLOW    Comment: BIOCHEMICALS MAY BE AFFECTED BY COLOR   APPearance CLOUDY (A) CLEAR   Specific Gravity, Urine 1.028 1.005 - 1.030   pH 5.0 5.0 - 8.0   Glucose, UA NEGATIVE NEGATIVE mg/dL   Hgb urine dipstick MODERATE (A) NEGATIVE   Bilirubin Urine NEGATIVE NEGATIVE   Ketones, ur 20 (A) NEGATIVE mg/dL   Protein, ur 100 (A) NEGATIVE mg/dL   Nitrite POSITIVE (A) NEGATIVE   Leukocytes, UA LARGE (A) NEGATIVE   RBC / HPF 6-30 0 - 5 RBC/hpf   WBC, UA TOO NUMEROUS TO COUNT 0 -  5 WBC/hpf   Bacteria, UA RARE (A) NONE SEEN   Squamous Epithelial / LPF 0-5 (A) NONE SEEN   WBC Clumps PRESENT    Mucus PRESENT     Comment: Performed at [facility], [place] [address] [place], [place]  [address]   No results found.  Pending Labs Unresulted Labs (From admission, onward)   Start     Ordered   08/26/17 0500  CBC with Differential/Platelet  Daily,   R     08/25/17 2234   08/26/17 9179  Basic metabolic panel  Daily,   R     08/25/17 2234      Vitals/Pain Today's Vitals   08/25/17 2120 08/25/17 2200 08/25/17 2216 08/26/17 0026  BP: 101/61 (!) 113/56 (!) 113/56 (S) 116/64  Pulse: 85 87 89 88  Resp: 18  18 (S) 18  Temp:    (S) 98.9 F (37.2 C)  TempSrc:    (S) Oral  SpO2: 98% 97% 97% (S) 100%  PainSc:        Isolation Precautions No active isolations  Medications Medications  acetaminophen (TYLENOL) tablet 500 mg (not administered)  conjugated estrogens (PREMARIN) vaginal cream 1 Applicatorful (not administered)  oxyCODONE (Oxy IR/ROXICODONE) immediate release tablet 5 mg (not administered)  enoxaparin (LOVENOX) injection 40 mg (40 mg Subcutaneous Given 08/25/17 2333)  0.9 % NaCl with KCl 20 mEq/ L  infusion ( Intravenous New Bag/Given 08/25/17 2333)  ibuprofen (ADVIL,MOTRIN) tablet 600 mg (not administered)  HYDROmorphone (DILAUDID) injection 0.2-0.6 mg (not administered)  senna-docusate (Senokot-S) tablet 1 tablet (not administered)  simethicone (MYLICON) chewable tablet 80 mg (not administered)  ondansetron (ZOFRAN) tablet 4 mg (not administered)    Or  ondansetron (ZOFRAN) injection 4 mg (not administered)  piperacillin-tazobactam (ZOSYN) IVPB 3.375 g (not administered)  lactated ringers bolus 1,000 mL (0 mLs Intravenous Stopped 08/25/17 2015)  piperacillin-tazobactam (ZOSYN) IVPB 3.375 g (0 g Intravenous Stopped 08/25/17 2059)    Mobility walks

## 2017-08-26 NOTE — Progress Notes (Signed)
Patient arrived on the unit at approximately [place]. She is alert and verbally responsive. Has had 2 loose stools since admission. She stated loose stools started yesterday. Leg bag removed and foley bag attached. No other complaints voiced at this time.

## 2017-08-27 DIAGNOSIS — C562 Malignant neoplasm of left ovary: Secondary | ICD-10-CM

## 2017-08-27 DIAGNOSIS — R5082 Postprocedural fever: Principal | ICD-10-CM

## 2017-08-27 DIAGNOSIS — S3720XA Unspecified injury of bladder, initial encounter: Secondary | ICD-10-CM

## 2017-08-27 DIAGNOSIS — T451X5A Adverse effect of antineoplastic and immunosuppressive drugs, initial encounter: Secondary | ICD-10-CM

## 2017-08-27 DIAGNOSIS — D6481 Anemia due to antineoplastic chemotherapy: Secondary | ICD-10-CM

## 2017-08-27 LAB — BASIC METABOLIC PANEL
ANION GAP: 7 (ref 5–15)
BUN: 7 mg/dL (ref 6–20)
CALCIUM: 7.5 mg/dL — AB (ref 8.9–10.3)
CO2: 20 mmol/L — ABNORMAL LOW (ref 22–32)
Chloride: 114 mmol/L — ABNORMAL HIGH (ref 101–111)
Creatinine, Ser: 0.83 mg/dL (ref 0.44–1.00)
Glucose, Bld: 76 mg/dL (ref 65–99)
Potassium: 3.8 mmol/L (ref 3.5–5.1)
Sodium: 141 mmol/L (ref 135–145)

## 2017-08-27 LAB — CBC WITH DIFFERENTIAL/PLATELET
BASOS ABS: 0 10*3/uL (ref 0.0–0.1)
BASOS PCT: 0 %
EOS PCT: 6 %
Eosinophils Absolute: 0.3 10*3/uL (ref 0.0–0.7)
HCT: 23.8 % — ABNORMAL LOW (ref 36.0–46.0)
Hemoglobin: 8 g/dL — ABNORMAL LOW (ref 12.0–15.0)
Lymphocytes Relative: 15 %
Lymphs Abs: 0.8 10*3/uL (ref 0.7–4.0)
MCH: 31.6 pg (ref 26.0–34.0)
MCHC: 33.6 g/dL (ref 30.0–36.0)
MCV: 94.1 fL (ref 78.0–100.0)
Monocytes Absolute: 0.3 10*3/uL (ref 0.1–1.0)
Monocytes Relative: 6 %
NEUTROS ABS: 3.9 10*3/uL (ref 1.7–7.7)
Neutrophils Relative %: 73 %
PLATELETS: 176 10*3/uL (ref 150–400)
RBC: 2.53 MIL/uL — AB (ref 3.87–5.11)
RDW: 14.9 % (ref 11.5–15.5)
WBC: 5.3 10*3/uL (ref 4.0–10.5)

## 2017-08-27 MED ORDER — HEPARIN SOD (PORK) LOCK FLUSH 100 UNIT/ML IV SOLN
500.0000 [IU] | INTRAVENOUS | Status: AC | PRN
  Administered 2017-08-27: 500 [IU]

## 2017-08-27 MED ORDER — LOPERAMIDE HCL 2 MG PO CAPS: 2.0000 mg | ORAL_CAPSULE | ORAL | 0 refills | Status: DC | PRN

## 2017-08-27 MED ORDER — FERROUS SULFATE 300 (60 FE) MG/5ML PO SYRP: 300.0000 mg | ORAL_SOLUTION | Freq: Two times a day (BID) | ORAL | 3 refills | Status: DC

## 2017-08-27 MED ORDER — AMOXICILLIN-POT CLAVULANATE 875-125 MG PO TABS: 1.0000 | ORAL_TABLET | Freq: Two times a day (BID) | ORAL | 0 refills | Status: DC

## 2017-08-27 NOTE — Discharge Instructions (Signed)
08/27/2017  Activity: 1. No lifting or straining restrictions  2. No driving restrictions.  3. Shower daily.    4. No sexual activity and nothing in the vagina until the fistula has healed (no longer leaking urine from the vagina and assessed by Dr Denman George)   Medications:  -  Resume all of your usual medications that you were taking prior to this admission  New Medications: Imodium - take 2 mg as needed for loose stools Iron solution - take twice a day for anemia augmentin (antibiotic) - take twice a day for 1 week.   Diet: 1. Low sodium Heart Healthy Diet is recommended.  2. It is safe to use a laxative if you have difficulty moving your bowels.   Wound Care: 1. Keep clean and dry.  Shower daily.  Reasons to call the Doctor:   Fever - Oral temperature greater than 100.4 degrees Fahrenheit  Foul-smelling vaginal discharge  Nausea and vomiting  Increased pain at the site of the incision that is unrelieved with pain medicine.  Difficulty breathing with or without chest pain  New calf pain especially if only on one side  Sudden, continuing increased vaginal bleeding with or without clots.   Follow-up: 1. See Dr Alvy Bimler as scheduled in 1 week.  Contacts: For questions or concerns you should contact:  Dr. Everitt Amber at [phone] After hours and on week-ends call [phone] and ask to speak to the physician on call for Gynecologic Oncology

## 2017-08-27 NOTE — Discharge Summary (Signed)
Physician Discharge Summary  Patient ID: Colleen Mckinney MRN: [id number] DOB/AGE: 1946/07/03 72 y.o.  Admit date: 08/25/2017 Discharge date: 08/27/2017  Admission Diagnoses: <principal problem not specified>  Discharge Diagnoses:  Active Problems:   Left ovarian epithelial cancer (HCC)   Postoperative fever   Bladder injury, initial encounter   Vesicovaginal fistula   Discharged Condition: good  Hospital Course:  1/ patient was admitted on 08/25/17 for postoperative fevers, a vesicovaginal fistula and ovarian cancer 2/ On admission she had fevers to 102, a WBC of 17,000; oliguria, and watery high volume diarrhea 3/ She was admitted, rehydrated with IVF's, prescribed zosyn, tested for c. Diff (negative), and given pyridium to determine if there was a colovesicular fistula (negative - no orange discoloration of stools).  She defervesced and had no fevers for 48 hours with normalized wbc and feeling much better. She was determined to be anemic with no evidence of ongoing bleeding and was prescribed iron.  Her urine output improved and her renal function remained normal.  4/ new medications on discharge include imodium, augmentin x 1 week, iron sulfate solution.  Consults: None  Significant Diagnostic Studies: labs:  CBC    Component Value Date/Time   WBC 5.3 08/27/2017 0607   RBC 2.53 (L) 08/27/2017 0607   HGB 8.0 (L) 08/27/2017 0607   HGB 10.1 (L) 06/25/2017 1003   HCT 23.8 (L) 08/27/2017 0607   HCT 30.9 (L) 06/25/2017 1003   PLT 176 08/27/2017 0607   PLT 471 (H) 08/08/2017 1408   PLT 275 06/25/2017 1003   MCV 94.1 08/27/2017 0607   MCV 90.1 06/25/2017 1003   MCH 31.6 08/27/2017 0607   MCHC 33.6 08/27/2017 0607   RDW 14.9 08/27/2017 0607   RDW 18.1 (H) 06/25/2017 1003   LYMPHSABS 0.8 08/27/2017 0607   LYMPHSABS 1.4 06/25/2017 1003   MONOABS 0.3 08/27/2017 0607   MONOABS 0.9 06/25/2017 1003   EOSABS 0.3 08/27/2017 0607   EOSABS 0.0 06/25/2017 1003   BASOSABS 0.0  08/27/2017 0607   BASOSABS 0.1 06/25/2017 1003   BMET    Component Value Date/Time   NA 141 08/27/2017 0607   NA 140 06/25/2017 1003   K 3.8 08/27/2017 0607   K 4.6 06/25/2017 1003   CL 114 (H) 08/27/2017 0607   CO2 20 (L) 08/27/2017 0607   CO2 29 06/25/2017 1003   GLUCOSE 76 08/27/2017 0607   GLUCOSE 94 06/25/2017 1003   GLUCOSE 81 05/18/2006 1112   BUN 7 08/27/2017 0607   BUN 14.8 06/25/2017 1003   CREATININE 0.83 08/27/2017 0607   CREATININE 0.9 06/25/2017 1003   CALCIUM 7.5 (L) 08/27/2017 0607   CALCIUM 9.5 06/25/2017 1003   GFRNONAA >60 08/27/2017 0607   GFRAA >60 08/27/2017 0607   C. Diff pcr negative.  Treatments: IV hydration and antibiotics: Zosyn  Discharge Exam: Blood pressure 108/71, pulse 72, temperature 98.3 F (36.8 C), temperature source Oral, resp. rate 15, height 5\' 4"  (1.626 m), weight 95 lb 10.9 oz (43.4 kg), SpO2 99 %. General appearance: alert and cooperative GI: soft, non-tender; bowel sounds normal; no masses,  no organomegaly Incision/Wound: clean and intact without cellulitis. Foley has orange yellow urine in it.   Disposition: 01-Home or Self Care   Allergies as of 08/27/2017   No Known Allergies     Medication List    TAKE these medications   acetaminophen 500 MG tablet Commonly known as:  TYLENOL Take 500 mg by mouth every 6 (six) hours as needed for  mild pain or moderate pain.   amoxicillin-clavulanate 875-125 MG tablet Commonly known as:  AUGMENTIN Take 1 tablet by mouth 2 (two) times daily.   CALCIUM + D PO Take 1 tablet by mouth daily.   conjugated estrogens vaginal cream Commonly known as:  PREMARIN Place 1 Applicatorful vaginally at bedtime.   dexamethasone 4 MG tablet Commonly known as:  DECADRON Take 5 tabs the evening before chemo and 5 tabs the morning of chemo, every 21 days by mouth with pills   ferrous sulfate 300 (60 Fe) MG/5ML syrup Take 5 mLs (300 mg total) by mouth 2 (two) times daily with a meal.    lidocaine-prilocaine cream Commonly known as:  EMLA Apply to affected area once What changed:    how much to take  how to take this  when to take this  reasons to take this  additional instructions   loperamide 2 MG capsule Commonly known as:  IMODIUM Take 1 capsule (2 mg total) by mouth as needed for diarrhea or loose stools.   mirabegron ER 25 MG Tb24 tablet Commonly known as:  MYRBETRIQ Take 25 mg by mouth daily.   NEULASTA [place] Inject 1 Dose into the skin once.   ondansetron 8 MG tablet Commonly known as:  ZOFRAN Take 1 tablet (8 mg total) by mouth every 8 (eight) hours as needed for refractory nausea / vomiting. Start on day 3 after chemo.   oxyCODONE 5 MG immediate release tablet Commonly known as:  Oxy IR/ROXICODONE Take 1 tablet (5 mg total) by mouth every 4 (four) hours as needed for severe pain.   prochlorperazine 10 MG tablet Commonly known as:  COMPAZINE Take 1 tablet (10 mg total) every 6 (six) hours as needed by mouth (Nausea or vomiting).      Follow-up Information    Heath Lark, MD Follow up in 1 week(s).   Specialty:  Hematology and Oncology Why:  as scheduled for chemotherapy Contact information: [place] [address] [phone]           Signed: [name] 08/27/2017, 8:25 AM

## 2017-09-04 ENCOUNTER — Other Ambulatory Visit: Payer: Medicare Other

## 2017-09-04 ENCOUNTER — Inpatient Hospital Stay: Payer: Medicare Other

## 2017-09-04 ENCOUNTER — Inpatient Hospital Stay: Payer: Medicare Other | Attending: Gynecologic Oncology

## 2017-09-04 VITALS — BP 119/78 | HR 64 | Temp 98.2°F | Resp 16 | Wt 96.0 lb

## 2017-09-04 DIAGNOSIS — C562 Malignant neoplasm of left ovary: Secondary | ICD-10-CM | POA: Insufficient documentation

## 2017-09-04 DIAGNOSIS — C786 Secondary malignant neoplasm of retroperitoneum and peritoneum: Secondary | ICD-10-CM

## 2017-09-04 DIAGNOSIS — D6481 Anemia due to antineoplastic chemotherapy: Secondary | ICD-10-CM

## 2017-09-04 DIAGNOSIS — R112 Nausea with vomiting, unspecified: Secondary | ICD-10-CM | POA: Diagnosis not present

## 2017-09-04 DIAGNOSIS — R64 Cachexia: Secondary | ICD-10-CM | POA: Diagnosis not present

## 2017-09-04 DIAGNOSIS — C561 Malignant neoplasm of right ovary: Secondary | ICD-10-CM

## 2017-09-04 DIAGNOSIS — Z5111 Encounter for antineoplastic chemotherapy: Secondary | ICD-10-CM | POA: Insufficient documentation

## 2017-09-04 DIAGNOSIS — D72829 Elevated white blood cell count, unspecified: Secondary | ICD-10-CM | POA: Insufficient documentation

## 2017-09-04 DIAGNOSIS — R109 Unspecified abdominal pain: Secondary | ICD-10-CM | POA: Diagnosis not present

## 2017-09-04 DIAGNOSIS — C801 Malignant (primary) neoplasm, unspecified: Secondary | ICD-10-CM

## 2017-09-04 DIAGNOSIS — T451X5A Adverse effect of antineoplastic and immunosuppressive drugs, initial encounter: Secondary | ICD-10-CM

## 2017-09-04 DIAGNOSIS — N820 Vesicovaginal fistula: Secondary | ICD-10-CM | POA: Diagnosis not present

## 2017-09-04 LAB — COMPREHENSIVE METABOLIC PANEL
ALT: 12 U/L (ref 0–55)
AST: 12 U/L (ref 5–34)
Albumin: 3.7 g/dL (ref 3.5–5.0)
Alkaline Phosphatase: 55 U/L (ref 40–150)
Anion gap: 11 (ref 3–11)
BUN: 17 mg/dL (ref 7–26)
CHLORIDE: 102 mmol/L (ref 98–109)
CO2: 26 mmol/L (ref 22–29)
Calcium: 9.7 mg/dL (ref 8.4–10.4)
Creatinine, Ser: 0.91 mg/dL (ref 0.60–1.10)
GFR calc Af Amer: >60 mL/min (ref >60)
GFR calc non Af Amer: >60 mL/min (ref >60)
GLUCOSE: 197 mg/dL — AB (ref 70–140)
POTASSIUM: 3.9 mmol/L (ref 3.5–5.1)
SODIUM: 139 mmol/L (ref 136–145)
Total Bilirubin: 0.2 mg/dL (ref 0.2–1.2)
Total Protein: 7.3 g/dL (ref 6.4–8.3)

## 2017-09-04 LAB — CBC WITH DIFFERENTIAL/PLATELET
Basophils Absolute: 0 10*3/uL (ref 0.0–0.1)
Basophils Relative: 0 %
EOS PCT: 0 %
Eosinophils Absolute: 0 10*3/uL (ref 0.0–0.5)
HCT: 32.5 % — ABNORMAL LOW (ref 34.8–46.6)
Hemoglobin: 10.8 g/dL — ABNORMAL LOW (ref 11.6–15.9)
LYMPHS ABS: 0.4 10*3/uL — AB (ref 0.9–3.3)
LYMPHS PCT: 6 %
MCH: 30.4 pg (ref 25.1–34.0)
MCHC: 33.2 g/dL (ref 31.5–36.0)
MCV: 91.5 fL (ref 79.5–101.0)
MONO ABS: 0.1 10*3/uL (ref 0.1–0.9)
MONOS PCT: 2 %
Neutro Abs: 6.4 10*3/uL (ref 1.5–6.5)
Neutrophils Relative %: 92 %
PLATELETS: 310 10*3/uL (ref 145–400)
RBC: 3.55 MIL/uL — AB (ref 3.70–5.45)
RDW: 14.2 % (ref 11.2–14.5)
WBC: 6.9 10*3/uL (ref 3.9–10.3)

## 2017-09-04 LAB — SAMPLE TO BLOOD BANK

## 2017-09-04 MED ORDER — SODIUM CHLORIDE 0.9 % IV SOLN
Freq: Once | INTRAVENOUS | Status: AC
  Administered 2017-09-04: 11:00:00 via INTRAVENOUS
  Filled 2017-09-04: qty 5

## 2017-09-04 MED ORDER — SODIUM CHLORIDE 0.9 % IV SOLN
Freq: Once | INTRAVENOUS | Status: AC
  Administered 2017-09-04: 10:00:00 via INTRAVENOUS

## 2017-09-04 MED ORDER — HEPARIN SOD (PORK) LOCK FLUSH 100 UNIT/ML IV SOLN
500.0000 [IU] | Freq: Once | INTRAVENOUS | Status: AC | PRN
  Administered 2017-09-04: 500 [IU]
  Filled 2017-09-04: qty 5

## 2017-09-04 MED ORDER — PALONOSETRON HCL INJECTION 0.25 MG/5ML
INTRAVENOUS | Status: AC
  Filled 2017-09-04: qty 5

## 2017-09-04 MED ORDER — SODIUM CHLORIDE 0.9 % IV SOLN
378.6000 mg | Freq: Once | INTRAVENOUS | Status: AC
  Administered 2017-09-04: 380 mg via INTRAVENOUS
  Filled 2017-09-04: qty 38

## 2017-09-04 MED ORDER — DIPHENHYDRAMINE HCL 50 MG/ML IJ SOLN
50.0000 mg | Freq: Once | INTRAMUSCULAR | Status: AC
  Administered 2017-09-04: 50 mg via INTRAVENOUS

## 2017-09-04 MED ORDER — SODIUM CHLORIDE 0.9 % IV SOLN
131.2500 mg/m2 | Freq: Once | INTRAVENOUS | Status: AC
  Administered 2017-09-04: 192 mg via INTRAVENOUS
  Filled 2017-09-04: qty 32

## 2017-09-04 MED ORDER — SODIUM CHLORIDE 0.9 % IV SOLN
20.0000 mg | Freq: Once | INTRAVENOUS | Status: AC
  Administered 2017-09-04: 20 mg via INTRAVENOUS
  Filled 2017-09-04: qty 2

## 2017-09-04 MED ORDER — SODIUM CHLORIDE 0.9% FLUSH
10.0000 mL | Freq: Once | INTRAVENOUS | Status: AC
  Administered 2017-09-04: 10 mL
  Filled 2017-09-04: qty 10

## 2017-09-04 MED ORDER — FAMOTIDINE IN NACL 20-0.9 MG/50ML-% IV SOLN: 20.0000 mg | Freq: Once | INTRAVENOUS | Status: DC

## 2017-09-04 MED ORDER — SODIUM CHLORIDE 0.9% FLUSH
10.0000 mL | INTRAVENOUS | Status: DC | PRN
  Administered 2017-09-04: 10 mL
  Filled 2017-09-04: qty 10

## 2017-09-04 MED ORDER — PALONOSETRON HCL INJECTION 0.25 MG/5ML
0.2500 mg | Freq: Once | INTRAVENOUS | Status: AC
  Administered 2017-09-04: 0.25 mg via INTRAVENOUS

## 2017-09-04 MED ORDER — DIPHENHYDRAMINE HCL 50 MG/ML IJ SOLN
INTRAMUSCULAR | Status: AC
  Filled 2017-09-04: qty 1

## 2017-09-04 NOTE — Patient Instructions (Signed)
[facility] Discharge Instructions for Patients Receiving Chemotherapy  Today you received the following chemotherapy agents Paclitaxel (TAXOL) and Carboplatin (PARAPLATIN).  To help prevent nausea and vomiting after your treatment, we encourage you to take your nausea medication.    If you develop nausea and vomiting that is not controlled by your nausea medication, call the clinic.   BELOW ARE SYMPTOMS THAT SHOULD BE REPORTED IMMEDIATELY:  *FEVER GREATER THAN 100.5 F  *CHILLS WITH OR WITHOUT FEVER  NAUSEA AND VOMITING THAT IS NOT CONTROLLED WITH YOUR NAUSEA MEDICATION  *UNUSUAL SHORTNESS OF BREATH  *UNUSUAL BRUISING OR BLEEDING  TENDERNESS IN MOUTH AND THROAT WITH OR WITHOUT PRESENCE OF ULCERS  *URINARY PROBLEMS  *BOWEL PROBLEMS  UNUSUAL RASH Items with * indicate a potential emergency and should be followed up as soon as possible.  Feel free to call the clinic should you have any questions or concerns. The clinic phone number is [phone].  Please show the [facility] at check-in to the Emergency Department and triage nurse.  Paclitaxel injection What is this medicine? PACLITAXEL (PAK li TAX el) is a chemotherapy drug. It targets fast dividing cells, like cancer cells, and causes these cells to die. This medicine is used to treat ovarian cancer, breast cancer, and other cancers. This medicine may be used for other purposes; ask your health care provider or pharmacist if you have questions. COMMON BRAND NAME(S): Onxol, Taxol What should I tell my health care provider before I take this medicine? They need to know if you have any of these conditions: -blood disorders -irregular heartbeat -infection (especially a virus infection such as chickenpox, cold sores, or herpes) -liver disease -previous or ongoing radiation therapy -an unusual or allergic reaction to paclitaxel, alcohol, polyoxyethylated castor oil, other chemotherapy agents, other  medicines, foods, dyes, or preservatives -pregnant or trying to get pregnant -breast-feeding How should I use this medicine? This drug is given as an infusion into a vein. It is administered in a hospital or clinic by a specially trained health care professional. Talk to your pediatrician regarding the use of this medicine in children. Special care may be needed. Overdosage: If you think you have taken too much of this medicine contact a poison control center or emergency room at once. NOTE: This medicine is only for you. Do not share this medicine with others. What if I miss a dose? It is important not to miss your dose. Call your doctor or health care professional if you are unable to keep an appointment. What may interact with this medicine? Do not take this medicine with any of the following medications: -disulfiram -metronidazole This medicine may also interact with the following medications: -cyclosporine -diazepam -ketoconazole -medicines to increase blood counts like filgrastim, pegfilgrastim, sargramostim -other chemotherapy drugs like cisplatin, doxorubicin, epirubicin, etoposide, teniposide, vincristine -quinidine -testosterone -vaccines -verapamil Talk to your doctor or health care professional before taking any of these medicines: -acetaminophen -aspirin -ibuprofen -ketoprofen -naproxen This list may not describe all possible interactions. Give your health care provider a list of all the medicines, herbs, non-prescription drugs, or dietary supplements you use. Also tell them if you smoke, drink alcohol, or use illegal drugs. Some items may interact with your medicine. What should I watch for while using this medicine? Your condition will be monitored carefully while you are receiving this medicine. You will need important blood work done while you are taking this medicine. This medicine can cause serious allergic reactions. To reduce your risk you will  need to take other  medicine(s) before treatment with this medicine. If you experience allergic reactions like skin rash, itching or hives, swelling of the face, lips, or tongue, tell your doctor or health care professional right away. In some cases, you may be given additional medicines to help with side effects. Follow all directions for their use. This drug may make you feel generally unwell. This is not uncommon, as chemotherapy can affect healthy cells as well as cancer cells. Report any side effects. Continue your course of treatment even though you feel ill unless your doctor tells you to stop. Call your doctor or health care professional for advice if you get a fever, chills or sore throat, or other symptoms of a cold or flu. Do not treat yourself. This drug decreases your body's ability to fight infections. Try to avoid being around people who are sick. This medicine may increase your risk to bruise or bleed. Call your doctor or health care professional if you notice any unusual bleeding. Be careful brushing and flossing your teeth or using a toothpick because you may get an infection or bleed more easily. If you have any dental work done, tell your dentist you are receiving this medicine. Avoid taking products that contain aspirin, acetaminophen, ibuprofen, naproxen, or ketoprofen unless instructed by your doctor. These medicines may hide a fever. Do not become pregnant while taking this medicine. Women should inform their doctor if they wish to become pregnant or think they might be pregnant. There is a potential for serious side effects to an unborn child. Talk to your health care professional or pharmacist for more information. Do not breast-feed an infant while taking this medicine. Men are advised not to father a child while receiving this medicine. This product may contain alcohol. Ask your pharmacist or healthcare provider if this medicine contains alcohol. Be sure to tell all healthcare providers you are  taking this medicine. Certain medicines, like metronidazole and disulfiram, can cause an unpleasant reaction when taken with alcohol. The reaction includes flushing, headache, nausea, vomiting, sweating, and increased thirst. The reaction can last from 30 minutes to several hours. What side effects may I notice from receiving this medicine? Side effects that you should report to your doctor or health care professional as soon as possible: -allergic reactions like skin rash, itching or hives, swelling of the face, lips, or tongue -low blood counts - This drug may decrease the number of white blood cells, red blood cells and platelets. You may be at increased risk for infections and bleeding. -signs of infection - fever or chills, cough, sore throat, pain or difficulty passing urine -signs of decreased platelets or bleeding - bruising, pinpoint red spots on the skin, black, tarry stools, nosebleeds -signs of decreased red blood cells - unusually weak or tired, fainting spells, lightheadedness -breathing problems -chest pain -high or low blood pressure -mouth sores -nausea and vomiting -pain, swelling, redness or irritation at the injection site -pain, tingling, numbness in the hands or feet -slow or irregular heartbeat -swelling of the ankle, feet, hands Side effects that usually do not require medical attention (report to your doctor or health care professional if they continue or are bothersome): -bone pain -complete hair loss including hair on your head, underarms, pubic hair, eyebrows, and eyelashes -changes in the color of fingernails -diarrhea -loosening of the fingernails -loss of appetite -muscle or joint pain -red flush to skin -sweating This list may not describe all possible side effects. Call your doctor for medical  advice about side effects. You may report side effects to FDA at 1-800-FDA-1088. Where should I keep my medicine? This drug is given in a hospital or clinic and  will not be stored at home. NOTE: This sheet is a summary. It may not cover all possible information. If you have questions about this medicine, talk to your doctor, pharmacist, or health care provider.  2018 Elsevier/Gold Standard (2015-04-20 19:58:00)  Carboplatin injection What is this medicine? CARBOPLATIN (KAR boe pla tin) is a chemotherapy drug. It targets fast dividing cells, like cancer cells, and causes these cells to die. This medicine is used to treat ovarian cancer and many other cancers. This medicine may be used for other purposes; ask your health care provider or pharmacist if you have questions. COMMON BRAND NAME(S): Paraplatin What should I tell my health care provider before I take this medicine? They need to know if you have any of these conditions: -blood disorders -hearing problems -kidney disease -recent or ongoing radiation therapy -an unusual or allergic reaction to carboplatin, cisplatin, other chemotherapy, other medicines, foods, dyes, or preservatives -pregnant or trying to get pregnant -breast-feeding How should I use this medicine? This drug is usually given as an infusion into a vein. It is administered in a hospital or clinic by a specially trained health care professional. Talk to your pediatrician regarding the use of this medicine in children. Special care may be needed. Overdosage: If you think you have taken too much of this medicine contact a poison control center or emergency room at once. NOTE: This medicine is only for you. Do not share this medicine with others. What if I miss a dose? It is important not to miss a dose. Call your doctor or health care professional if you are unable to keep an appointment. What may interact with this medicine? -medicines for seizures -medicines to increase blood counts like filgrastim, pegfilgrastim, sargramostim -some antibiotics like amikacin, gentamicin, neomycin, streptomycin, tobramycin -vaccines Talk to  your doctor or health care professional before taking any of these medicines: -acetaminophen -aspirin -ibuprofen -ketoprofen -naproxen This list may not describe all possible interactions. Give your health care provider a list of all the medicines, herbs, non-prescription drugs, or dietary supplements you use. Also tell them if you smoke, drink alcohol, or use illegal drugs. Some items may interact with your medicine. What should I watch for while using this medicine? Your condition will be monitored carefully while you are receiving this medicine. You will need important blood work done while you are taking this medicine. This drug may make you feel generally unwell. This is not uncommon, as chemotherapy can affect healthy cells as well as cancer cells. Report any side effects. Continue your course of treatment even though you feel ill unless your doctor tells you to stop. In some cases, you may be given additional medicines to help with side effects. Follow all directions for their use. Call your doctor or health care professional for advice if you get a fever, chills or sore throat, or other symptoms of a cold or flu. Do not treat yourself. This drug decreases your body's ability to fight infections. Try to avoid being around people who are sick. This medicine may increase your risk to bruise or bleed. Call your doctor or health care professional if you notice any unusual bleeding. Be careful brushing and flossing your teeth or using a toothpick because you may get an infection or bleed more easily. If you have any dental work done, tell your  dentist you are receiving this medicine. Avoid taking products that contain aspirin, acetaminophen, ibuprofen, naproxen, or ketoprofen unless instructed by your doctor. These medicines may hide a fever. Do not become pregnant while taking this medicine. Women should inform their doctor if they wish to become pregnant or think they might be pregnant. There is a  potential for serious side effects to an unborn child. Talk to your health care professional or pharmacist for more information. Do not breast-feed an infant while taking this medicine. What side effects may I notice from receiving this medicine? Side effects that you should report to your doctor or health care professional as soon as possible: -allergic reactions like skin rash, itching or hives, swelling of the face, lips, or tongue -signs of infection - fever or chills, cough, sore throat, pain or difficulty passing urine -signs of decreased platelets or bleeding - bruising, pinpoint red spots on the skin, black, tarry stools, nosebleeds -signs of decreased red blood cells - unusually weak or tired, fainting spells, lightheadedness -breathing problems -changes in hearing -changes in vision -chest pain -high blood pressure -low blood counts - This drug may decrease the number of white blood cells, red blood cells and platelets. You may be at increased risk for infections and bleeding. -nausea and vomiting -pain, swelling, redness or irritation at the injection site -pain, tingling, numbness in the hands or feet -problems with balance, talking, walking -trouble passing urine or change in the amount of urine Side effects that usually do not require medical attention (report to your doctor or health care professional if they continue or are bothersome): -hair loss -loss of appetite -metallic taste in the mouth or changes in taste This list may not describe all possible side effects. Call your doctor for medical advice about side effects. You may report side effects to FDA at 1-800-FDA-1088. Where should I keep my medicine? This drug is given in a hospital or clinic and will not be stored at home. NOTE: This sheet is a summary. It may not cover all possible information. If you have questions about this medicine, talk to your doctor, pharmacist, or health care provider.  2018 Elsevier/Gold  Standard (2007-09-24 14:38:05)     

## 2017-09-05 ENCOUNTER — Encounter (INDEPENDENT_AMBULATORY_CARE_PROVIDER_SITE_OTHER): Payer: Self-pay

## 2017-09-05 LAB — CA 125: Cancer Antigen (CA) 125: 12.5 U/mL (ref 0.0–38.1)

## 2017-09-06 ENCOUNTER — Inpatient Hospital Stay: Payer: Medicare Other

## 2017-09-06 VITALS — BP 119/67 | HR 78 | Temp 98.2°F | Resp 16

## 2017-09-06 DIAGNOSIS — C786 Secondary malignant neoplasm of retroperitoneum and peritoneum: Secondary | ICD-10-CM

## 2017-09-06 DIAGNOSIS — Z5111 Encounter for antineoplastic chemotherapy: Secondary | ICD-10-CM | POA: Diagnosis not present

## 2017-09-06 DIAGNOSIS — C801 Malignant (primary) neoplasm, unspecified: Secondary | ICD-10-CM

## 2017-09-06 DIAGNOSIS — C562 Malignant neoplasm of left ovary: Secondary | ICD-10-CM

## 2017-09-06 MED ORDER — PEGFILGRASTIM INJECTION 6 MG/0.6ML ~~LOC~~
6.0000 mg | PREFILLED_SYRINGE | Freq: Once | SUBCUTANEOUS | Status: AC
  Administered 2017-09-06: 6 mg via SUBCUTANEOUS

## 2017-09-06 MED ORDER — PEGFILGRASTIM INJECTION 6 MG/0.6ML ~~LOC~~
PREFILLED_SYRINGE | SUBCUTANEOUS | Status: AC
  Filled 2017-09-06: qty 0.6

## 2017-09-07 ENCOUNTER — Telehealth: Payer: Self-pay

## 2017-09-07 ENCOUNTER — Other Ambulatory Visit: Payer: Self-pay

## 2017-09-07 MED ORDER — AMOXICILLIN-POT CLAVULANATE 875-125 MG PO TABS: 1.0000 | ORAL_TABLET | Freq: Two times a day (BID) | ORAL | 0 refills | Status: AC

## 2017-09-07 NOTE — Telephone Encounter (Signed)
Called with below message. Verbalized understanding. Rx sent to pharmacy.

## 2017-09-07 NOTE — Telephone Encounter (Signed)
Monitor temp over the weekend  Call in Augmentin 875 mg BID PO x 7 days If not better, go to ER Advise her to call Monday morning at 830 am for update

## 2017-09-07 NOTE — Telephone Encounter (Signed)
Called and left message to call her with GYN clinic.  Called back. Complaining of being tired, with low grade fever starting today. Temperature 99.9 while on the phone, earlier today 100.3. Had injection yesterday. She took Claritin yesterday. She has been in the bed since yesterday. Started taking tylenol today. She is afraid she may have a infection and does not want to go to the hospital this weekend. She is requesting a antibiotic if possible or some suggestions.

## 2017-09-23 ENCOUNTER — Encounter: Payer: Self-pay | Admitting: Gynecologic Oncology

## 2017-09-25 ENCOUNTER — Inpatient Hospital Stay: Payer: Medicare Other

## 2017-09-25 ENCOUNTER — Encounter: Payer: Self-pay | Admitting: Gynecologic Oncology

## 2017-09-25 ENCOUNTER — Encounter: Payer: Self-pay | Admitting: Hematology and Oncology

## 2017-09-25 ENCOUNTER — Inpatient Hospital Stay (HOSPITAL_BASED_OUTPATIENT_CLINIC_OR_DEPARTMENT_OTHER): Payer: Medicare Other | Admitting: Hematology and Oncology

## 2017-09-25 DIAGNOSIS — D72825 Bandemia: Secondary | ICD-10-CM

## 2017-09-25 DIAGNOSIS — C562 Malignant neoplasm of left ovary: Secondary | ICD-10-CM

## 2017-09-25 DIAGNOSIS — C786 Secondary malignant neoplasm of retroperitoneum and peritoneum: Secondary | ICD-10-CM

## 2017-09-25 DIAGNOSIS — D6481 Anemia due to antineoplastic chemotherapy: Secondary | ICD-10-CM

## 2017-09-25 DIAGNOSIS — R64 Cachexia: Secondary | ICD-10-CM

## 2017-09-25 DIAGNOSIS — D72829 Elevated white blood cell count, unspecified: Secondary | ICD-10-CM | POA: Insufficient documentation

## 2017-09-25 DIAGNOSIS — C561 Malignant neoplasm of right ovary: Secondary | ICD-10-CM

## 2017-09-25 DIAGNOSIS — Z5111 Encounter for antineoplastic chemotherapy: Secondary | ICD-10-CM | POA: Diagnosis not present

## 2017-09-25 DIAGNOSIS — R634 Abnormal weight loss: Secondary | ICD-10-CM | POA: Diagnosis not present

## 2017-09-25 DIAGNOSIS — C801 Malignant (primary) neoplasm, unspecified: Secondary | ICD-10-CM

## 2017-09-25 DIAGNOSIS — N820 Vesicovaginal fistula: Secondary | ICD-10-CM | POA: Diagnosis not present

## 2017-09-25 DIAGNOSIS — T451X5A Adverse effect of antineoplastic and immunosuppressive drugs, initial encounter: Secondary | ICD-10-CM

## 2017-09-25 LAB — COMPREHENSIVE METABOLIC PANEL
ALT: 9 U/L (ref 0–55)
AST: 11 U/L (ref 5–34)
Albumin: 3.3 g/dL — ABNORMAL LOW (ref 3.5–5.0)
Alkaline Phosphatase: 77 U/L (ref 40–150)
Anion gap: 8 (ref 3–11)
BUN: 21 mg/dL (ref 7–26)
CHLORIDE: 104 mmol/L (ref 98–109)
CO2: 26 mmol/L (ref 22–29)
CREATININE: 0.83 mg/dL (ref 0.60–1.10)
Calcium: 9.4 mg/dL (ref 8.4–10.4)
GFR calc non Af Amer: >60 mL/min (ref >60)
Glucose, Bld: 177 mg/dL — ABNORMAL HIGH (ref 70–140)
POTASSIUM: 4.3 mmol/L (ref 3.5–5.1)
SODIUM: 138 mmol/L (ref 136–145)
Total Bilirubin: <0.2 mg/dL — ABNORMAL LOW (ref 0.2–1.2)
Total Protein: 7.1 g/dL (ref 6.4–8.3)

## 2017-09-25 LAB — CBC WITH DIFFERENTIAL/PLATELET
BASOS ABS: 0.2 10*3/uL — AB (ref 0.0–0.1)
Basophils Relative: 1 %
EOS ABS: 0 10*3/uL (ref 0.0–0.5)
EOS PCT: 0 %
HCT: 31.6 % — ABNORMAL LOW (ref 34.8–46.6)
Hemoglobin: 10.8 g/dL — ABNORMAL LOW (ref 11.6–15.9)
Lymphocytes Relative: 2 %
Lymphs Abs: 0.6 10*3/uL — ABNORMAL LOW (ref 0.9–3.3)
MCH: 30.7 pg (ref 25.1–34.0)
MCHC: 34.2 g/dL (ref 31.5–36.0)
MCV: 89.6 fL (ref 79.5–101.0)
Monocytes Absolute: 0.3 10*3/uL (ref 0.1–0.9)
Monocytes Relative: 1 %
Neutro Abs: 23.8 10*3/uL — ABNORMAL HIGH (ref 1.5–6.5)
Neutrophils Relative %: 96 %
PLATELETS: 340 10*3/uL (ref 145–400)
RBC: 3.52 MIL/uL — AB (ref 3.70–5.45)
RDW: 14.6 % — AB (ref 11.2–14.5)
WBC: 24.8 10*3/uL — AB (ref 3.9–10.3)

## 2017-09-25 LAB — SAMPLE TO BLOOD BANK

## 2017-09-25 MED ORDER — SODIUM CHLORIDE 0.9% FLUSH
10.0000 mL | Freq: Once | INTRAVENOUS | Status: AC
  Administered 2017-09-25: 10 mL
  Filled 2017-09-25: qty 10

## 2017-09-25 MED ORDER — HEPARIN SOD (PORK) LOCK FLUSH 100 UNIT/ML IV SOLN
500.0000 [IU] | Freq: Once | INTRAVENOUS | Status: AC
  Administered 2017-09-25: 500 [IU]
  Filled 2017-09-25: qty 5

## 2017-09-25 MED ORDER — SODIUM CHLORIDE 0.9 % IV SOLN
Freq: Once | INTRAVENOUS | Status: AC
  Administered 2017-09-25: 11:00:00 via INTRAVENOUS

## 2017-09-25 NOTE — Progress Notes (Signed)
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[facility]) The patient had numerous side effects from last dose of treatment She have extreme leukocytosis, severe crampy abdominal pain, persistent fistula requiring Foley catheter, progressive weight loss and uncontrolled nausea and vomiting I am very concerned about proceeding with chemotherapy today I recommend a liter of IV fluid resuscitation I plan to order CT scan of the abdomen and pelvis to exclude infection/abscess before we proceed with therapy  Leukocytosis She has significant leukocytosis It could be due to recent growth factor injection versus infection She was treated with a course of antibiotics 2 weeks ago She has recent diarrhea and severe nausea and vomiting She is immunocompromised and is at high risk of infection due to recent fistula I plan to hold chemotherapy today in order CT imaging for evaluation.  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 CT ABDOMEN PELVIS W CONTRAST    Standing Status:   Future    Standing Expiration Date:   09/26/2018    Order Specific Question:   If indicated for the ordered procedure, I authorize the administration of contrast media per Radiology protocol    Answer:   Yes    Order Specific Question:   Preferred imaging location?    Answer:   [facility]     Order Specific Question:   Radiology Contrast Protocol - do NOT remove file path    Answer:   [email or web address]    Order Specific Question:   Reason for Exam additional comments    Answer:   had recent surgery complicated by bladder injury and fistula, recent chemo    INTERVAL HISTORY: Please see below for problem oriented charting. 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  She presented for evaluation of an ultrasound was performed which demonstrated a uterus measuring 6 x 5 cm with a 1.3 mm endometrial stripe.  Multiple heterogeneous pelvic masses were appreciated the right measured 8 x 5 cm and another measured 10 cm just posterior to the uterus CA-125 was obtained and returned a value of 404.8. She also reports early satiety for approximately 2 months and a 10 pound weight loss over the last year.  She also reports mild abdominal bloating and sensation of pelvic heaviness      04/25/2017 Imaging    Ct abdomen and pelvis: 1. Appearance  compatible with ovarian malignancy with widespread peritoneal, mesenteric, and omental metastatic disease. Specifically, a 12.7 cm primarily solid and partially cystic mass above the uterus probably arises from the right ovary and is associated with omental caking, complex ascites with peritoneal metastatic deposits, and mesenteric deposits of tumor. 2. Mild intrahepatic biliary  dilatation. The extrahepatic biliary tree is within normal limits. 3.  Aortic Atherosclerosis (ICD10-I70.0).      05/01/2017 Procedure    Successful CT-guided left abdominal omental mass 18 gauge core biopsies      05/01/2017 Pathology Results    Soft Tissue Needle Core Biopsy, Left omental - METASTATIC CARCINOMA, SEE COMMENT. 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 The catheter is ready for immediate use.      05/16/2017 -  Chemotherapy    She received neoadjuvant chemo with carbo/taxol x 3 cycles followed by 3 more cycles of chemo      06/25/2017 Tumor Marker    Patient's tumor was tested for the following markers: CA-125 Results of the tumor marker test revealed 64.5      07/09/2017 Imaging    Decreased size of cystic and solid central pelvic mass since previous study.  Near complete resolution of peritoneal metastatic disease and ascites since previous study.  No new or progressive metastatic disease identified. No evidence of metastatic disease within the thorax.  Large stool burden noted; recommend clinical correlation for possible constipation.      07/24/2017 Surgery    Operation: Robotic-assisted laparoscopic total hysterectomy with bilateral salpingoophorectomy, omentectomy, radical tumor debulking  Surgeon: [name]  Operative Findings:  : 3cm left omental/splenic flexure tumor nodule adherent to transverse colon. 10cm pelvic tumor from left ovary, adherent to uterus and rectum, necrotic. Left retroperitoneal infiltration.  Tumor rind remaining on posterior cul de sac and distal rectum - very thin, <1cm thick, representing optimal cytoreductive procedure.         07/24/2017 Pathology Results    1. Omentum, resection for tumor - METASTATIC CARCINOMA. 2. Uterus +/-  tubes/ovaries, neoplastic - LEFT OVARY: HIGH GRADE SEROUS CARCINOMA, SPANNING APPROXIMATELY 9.9 CM. TUMOR INVOLVES SURFACE. TREATMENT EFFECT PRESENT. SEE ONCOLOGY TABLE. - RIGHT OVARY: INVOLVED BY HIGH GRADE SEROUS CARCINOMA. - BILATERAL FALLOPIAN TUBES: INVOLVED BY HIGH GRADE SEROUS CARCINOMA. - UTERUS: -ENDOMETRIUM: ENDOMETRIAL TYPE POLYP. INACTIVE ENDOMETRIUM. NO HYPERPLASIA OR MALIGNANCY. -MYOMETRIUM: LEIOMYOMATA. NO MALIGNANCY. -SEROSA: INVOLVED BY HIGH GRADE SEROUS CARCINOMA. - CERVIX: HIGH GRADE SEROUS CARCINOMA PRESENT AT ANTERIOR CERVICAL SOFT TISSUE MARGIN. Microscopic Comment 2. OVARY Specimen(s): Uterus, cervix, bilateral ovaries and fallopian tubes, omentum. Procedure: (including lymph node sampling): Total hysterectomy with bilateral salpingo-oophorectomy. Omentectomy. Primary tumor site (including laterality): Left ovary. Ovarian surface involvement: Present. Ovarian capsule intact without fragmentation: Disrupted. Maximum tumor size (cm): Estimated 9.9 cm. Histologic type: High grade serous carcinoma. Grade: High grade (FIGO N/A) Peritoneal implants: (specify invasive or non-invasive): Present (>2 cm) as in part #1. Pelvic extension (list additional structures on separate lines and if involved): Right ovary, bilateral fallopian tubes, cervical soft tissue. Lymph nodes: number examined 0 ; number positive 0 TNM code: ypT3c, ypNX, ypMX FIGO Stage (based on pathologic findings, needs clinical correlation): IIIC Comments: None.      07/24/2017 - 07/26/2017 Hospital Admission    She was admitted to the hospital for surgical debulking, complicated by anemia requiring blood transfusion.  07/29/2017 - 08/02/2017 Hospital Admission    She was admitted to the hospital for management of hematoma and bladder injury.      07/29/2017 Imaging    1. Blood products and large volume hemostatic material in the pelvis correlating with operative history. Sterility is indeterminate  by imaging. No organized collection for drainage. 2. High-density posterior to the pelvic collection was present preoperatively and is attributed to mass calcification. No bowel leak is seen with oral contrast reaching the transverse colon. 3. Diffuse postoperative subcutaneous gas. Small hematomas at bilateral abdominal wall port sites. 4. Right nephrectomy.       07/31/2017 Procedure    Mild left hydronephrosis      08/01/2017 Imaging    Extravasation contrast from the posterior aspect of the bladder into the adjacent tissues      08/22/2017 Imaging    Persistent posterior left bladder wall leak near the midline, with contrast filling of a well-defined crescent-shaped extraluminal contrast collection posterior to the left bladder wall, and with vaginal fistulization demonstrated at the inferior margin of this collection      08/22/2017 Tumor Marker    Patient's tumor was tested for the following markers: CA-125 Results of the tumor marker test revealed 13.5      09/04/2017 Tumor Marker    Patient's tumor was tested for the following markers: CA-125 Results of the tumor marker test revealed 12.5       REVIEW OF SYSTEMS:   Constitutional: Denies fevers, chills  Eyes: Denies blurriness of vision Ears, nose, mouth, throat, and face: Denies mucositis or sore throat Respiratory: Denies cough, dyspnea or wheezes Cardiovascular: Denies palpitation, chest discomfort or lower extremity swelling Skin: Denies abnormal skin rashes Lymphatics: Denies new lymphadenopathy or easy bruising Neurological:Denies numbness, tingling or new weaknesses Behavioral/Psych: Mood is stable, no new changes  All other systems were reviewed with the patient and are negative.  I have reviewed the past medical history, past surgical history, social history and family history with the patient and they are unchanged from previous note.  ALLERGIES:  has No Known Allergies.  MEDICATIONS:  Current Outpatient  Medications  Medication Sig Dispense Refill  . acetaminophen (TYLENOL) 500 MG tablet Take 500 mg by mouth every 6 (six) hours as needed for mild pain or moderate pain.     . Calcium Carbonate-Vitamin D (CALCIUM + D PO) Take 1 tablet by mouth daily.     Marland Kitchen conjugated estrogens (PREMARIN) vaginal cream Place 1 Applicatorful vaginally at bedtime. 42.5 g 12  . dexamethasone (DECADRON) 4 MG tablet Take 5 tabs the evening before chemo and 5 tabs the morning of chemo, every 21 days by mouth with pills 30 tablet 1  . ferrous sulfate 300 (60 Fe) MG/5ML syrup Take 5 mLs (300 mg total) by mouth 2 (two) times daily with a meal. 150 mL 3  . lidocaine-prilocaine (EMLA) cream Apply to affected area once (Patient taking differently: Apply 1 application topically as needed. Apply to affected area once) 30 g 3  . loperamide (IMODIUM) 2 MG capsule Take 1 capsule (2 mg total) by mouth as needed for diarrhea or loose stools. 30 capsule 0  . mirabegron ER (MYRBETRIQ) 25 MG TB24 tablet Take 25 mg by mouth daily.    . ondansetron (ZOFRAN) 8 MG tablet Take 1 tablet (8 mg total) by mouth every 8 (eight) hours as needed for refractory nausea / vomiting. Start on day 3 after chemo. 30 tablet 1  . oxyCODONE (OXY IR/ROXICODONE) 5 MG  immediate release tablet Take 1 tablet (5 mg total) by mouth every 4 (four) hours as needed for severe pain. 60 tablet 0  . Pegfilgrastim (NEULASTA [place]) Inject 1 Dose into the skin once.    . prochlorperazine (COMPAZINE) 10 MG tablet Take 1 tablet (10 mg total) every 6 (six) hours as needed by mouth (Nausea or vomiting). 30 tablet 1   No current facility-administered medications for this visit.     PHYSICAL EXAMINATION: ECOG PERFORMANCE STATUS: 2 - Symptomatic, <50% confined to bed  Vitals:   09/25/17 0953  BP: 119/70  Pulse: 89  Resp: 18  Temp: 97.7 F (36.5 C)  SpO2: 100%   Filed Weights   09/25/17 0953  Weight: 91 lb 8 oz (41.5 kg)    GENERAL:alert, no distress and comfortable.   She looks extremely thin and cachectic SKIN: skin color, texture, turgor are normal, no rashes or significant lesions EYES: normal, Conjunctiva are pink and non-injected, sclera clear OROPHARYNX:no exudate, no erythema and lips, buccal mucosa, and tongue normal  NECK: supple, thyroid normal size, non-tender, without nodularity LYMPH:  no palpable lymphadenopathy in the cervical, axillary or inguinal LUNGS: clear to auscultation and percussion with normal breathing effort HEART: regular rate & rhythm and no murmurs and no lower extremity edema ABDOMEN:abdomen soft, mild discomfort on the left lower quadrant without rebound Musculoskeletal:no cyanosis of digits and no clubbing  NEURO: alert & oriented x 3 with fluent speech, no focal motor/sensory deficits  LABORATORY DATA:  I have reviewed the data as listed    Component Value Date/Time   NA 138 09/25/2017 0858   NA 140 06/25/2017 1003   K 4.3 09/25/2017 0858   K 4.6 06/25/2017 1003   CL 104 09/25/2017 0858   CO2 26 09/25/2017 0858   CO2 29 06/25/2017 1003   GLUCOSE 177 (H) 09/25/2017 0858   GLUCOSE 94 06/25/2017 1003   GLUCOSE 81 05/18/2006 1112   BUN 21 09/25/2017 0858   BUN 14.8 06/25/2017 1003   CREATININE 0.83 09/25/2017 0858   CREATININE 0.9 06/25/2017 1003   CALCIUM 9.4 09/25/2017 0858   CALCIUM 9.5 06/25/2017 1003   PROT 7.1 09/25/2017 0858   PROT 7.0 06/25/2017 1003   ALBUMIN 3.3 (L) 09/25/2017 0858   ALBUMIN 3.7 06/25/2017 1003   AST 11 09/25/2017 0858   AST 17 06/25/2017 1003   ALT 9 09/25/2017 0858   ALT 11 06/25/2017 1003   ALKPHOS 77 09/25/2017 0858   ALKPHOS 88 06/25/2017 1003   BILITOT <0.2 (L) 09/25/2017 0858   BILITOT 0.22 06/25/2017 1003   GFRNONAA >60 09/25/2017 0858   GFRAA >60 09/25/2017 0858    No results found for: SPEP, UPEP  Lab Results  Component Value Date   WBC 24.8 (H) 09/25/2017   NEUTROABS 23.8 (H) 09/25/2017   HGB 10.8 (L) 09/25/2017   HCT 31.6 (L) 09/25/2017   MCV 89.6  09/25/2017   PLT 340 09/25/2017      Chemistry      Component Value Date/Time   NA 138 09/25/2017 0858   NA 140 06/25/2017 1003   K 4.3 09/25/2017 0858   K 4.6 06/25/2017 1003   CL 104 09/25/2017 0858   CO2 26 09/25/2017 0858   CO2 29 06/25/2017 1003   BUN 21 09/25/2017 0858   BUN 14.8 06/25/2017 1003   CREATININE 0.83 09/25/2017 0858   CREATININE 0.9 06/25/2017 1003      Component Value Date/Time   CALCIUM 9.4 09/25/2017 0858   CALCIUM  9.5 06/25/2017 1003   ALKPHOS 77 09/25/2017 0858   ALKPHOS 88 06/25/2017 1003   AST 11 09/25/2017 0858   AST 17 06/25/2017 1003   ALT 9 09/25/2017 0858   ALT 11 06/25/2017 1003   BILITOT <0.2 (L) 09/25/2017 0858   BILITOT 0.22 06/25/2017 1003     All questions were answered. The patient knows to call the clinic with any problems, questions or concerns. No barriers to learning was detected.  I spent 30 minutes counseling the patient face to face. The total time spent in the appointment was 40 minutes and more than 50% was on counseling and review of test results  Heath Lark, MD 09/25/2017 3:47 PM

## 2017-09-25 NOTE — Assessment & Plan Note (Signed)
She has significant leukocytosis It could be due to recent growth factor injection versus infection She was treated with a course of antibiotics 2 weeks ago She has recent diarrhea and severe nausea and vomiting She is immunocompromised and is at high risk of infection due to recent fistula I plan to hold chemotherapy today in order CT imaging for evaluation.  She agreed to proceed

## 2017-09-25 NOTE — Patient Instructions (Signed)
Dehydration, Adult Dehydration is when there is not enough fluid or water in your body. This happens when you lose more fluids than you take in. Dehydration can range from mild to very bad. It should be treated right away to keep it from getting very bad. Symptoms of mild dehydration may include:  Thirst.  Dry lips.  Slightly dry mouth.  Dry, warm skin.  Dizziness. Symptoms of moderate dehydration may include:  Very dry mouth.  Muscle cramps.  Dark pee (urine). Pee may be the color of tea.  Your body making less pee.  Your eyes making fewer tears.  Heartbeat that is uneven or faster than normal (palpitations).  Headache.  Light-headedness, especially when you stand up from sitting.  Fainting (syncope). Symptoms of very bad dehydration may include:  Changes in skin, such as: ? Cold and clammy skin. ? Blotchy (mottled) or pale skin. ? Skin that does not quickly return to normal after being lightly pinched and let go (poor skin turgor).  Changes in body fluids, such as: ? Feeling very thirsty. ? Your eyes making fewer tears. ? Not sweating when body temperature is high, such as in hot weather. ? Your body making very little pee.  Changes in vital signs, such as: ? Weak pulse. ? Pulse that is more than 100 beats a minute when you are sitting still. ? Fast breathing. ? Low blood pressure.  Other changes, such as: ? Sunken eyes. ? Cold hands and feet. ? Confusion. ? Lack of energy (lethargy). ? Trouble waking up from sleep. ? Short-term weight loss. ? Unconsciousness. Follow these instructions at home:  If told by your doctor, drink an ORS: ? Make an ORS by using instructions on the package. ? Start by drinking small amounts, about  cup (120 mL) every 5-10 minutes. ? Slowly drink more until you have had the amount that your doctor said to have.  Drink enough clear fluid to keep your pee clear or pale yellow. If you were told to drink an ORS, finish the ORS  first, then start slowly drinking clear fluids. Drink fluids such as: ? Water. Do not drink only water by itself. Doing that can make the salt (sodium) level in your body get too low (hyponatremia). ? Ice chips. ? Fruit juice that you have added water to (diluted). ? Low-calorie sports drinks.  Avoid: ? Alcohol. ? Drinks that have a lot of sugar. These include high-calorie sports drinks, fruit juice that does not have water added, and soda. ? Caffeine. ? Foods that are greasy or have a lot of fat or sugar.  Take over-the-counter and prescription medicines only as told by your doctor.  Do not take salt tablets. Doing that can make the salt level in your body get too high (hypernatremia).  Eat foods that have minerals (electrolytes). Examples include bananas, oranges, potatoes, tomatoes, and spinach.  Keep all follow-up visits as told by your doctor. This is important. Contact a doctor if:  You have belly (abdominal) pain that: ? Gets worse. ? Stays in one area (localizes).  You have a rash.  You have a stiff neck.  You get angry or annoyed more easily than normal (irritability).  You are more sleepy than normal.  You have a harder time waking up than normal.  You feel: ? Weak. ? Dizzy. ? Very thirsty.  You have peed (urinated) only a small amount of very dark pee during 6-8 hours. Get help right away if:  You have symptoms of   very bad dehydration.  You cannot drink fluids without throwing up (vomiting).  Your symptoms get worse with treatment.  You have a fever.  You have a very bad headache.  You are throwing up or having watery poop (diarrhea) and it: ? Gets worse. ? Does not go away.  You have blood or something green (bile) in your throw-up.  You have blood in your poop (stool). This may cause poop to look black and tarry.  You have not peed in 6-8 hours.  You pass out (faint).  Your heart rate when you are sitting still is more than 100 beats a  minute.  You have trouble breathing. This information is not intended to replace advice given to you by your health care provider. Make sure you discuss any questions you have with your health care provider. Document Released: 04/15/2009 Document Revised: 01/07/2016 Document Reviewed: 08/13/2015 Elsevier Interactive Patient Education  2018 [employer] Inc[employer]  

## 2017-09-25 NOTE — Assessment & Plan Note (Signed)
The patient had numerous side effects from last dose of treatment She have extreme leukocytosis, severe crampy abdominal pain, persistent fistula requiring Foley catheter, progressive weight loss and uncontrolled nausea and vomiting I am very concerned about proceeding with chemotherapy today I recommend a liter of IV fluid resuscitation I plan to order CT scan of the abdomen and pelvis to exclude infection/abscess before we proceed with therapy

## 2017-09-25 NOTE — Assessment & Plan Note (Signed)
She had mild persistent discharge She is requiring Foley catheter I would defer to urologist for management to see when it is appropriate to get her Foley catheter removed I plan to order repeat CT imaging for assessment

## 2017-09-25 NOTE — Assessment & Plan Note (Signed)
She has malignant cachexia and has lost more weight which she attributed to side effects from recent treatment I recommend increase oral intake as tolerated and plan to give her IV fluid for hydration today

## 2017-09-26 LAB — CA 125: Cancer Antigen (CA) 125: 11.3 U/mL (ref 0.0–38.1)

## 2017-09-27 ENCOUNTER — Ambulatory Visit: Payer: Medicare Other

## 2017-09-28 ENCOUNTER — Ambulatory Visit (HOSPITAL_COMMUNITY)
Admission: RE | Admit: 2017-09-28 | Discharge: 2017-09-28 | Disposition: A | Discharge status: Home / Self Care | Payer: Medicare Other | Source: Ambulatory Visit | Attending: Hematology and Oncology | Admitting: Hematology and Oncology

## 2017-09-28 ENCOUNTER — Telehealth: Payer: Self-pay

## 2017-09-28 DIAGNOSIS — Z905 Acquired absence of kidney: Secondary | ICD-10-CM | POA: Diagnosis not present

## 2017-09-28 DIAGNOSIS — I700 Atherosclerosis of aorta: Secondary | ICD-10-CM | POA: Diagnosis not present

## 2017-09-28 DIAGNOSIS — R634 Abnormal weight loss: Secondary | ICD-10-CM

## 2017-09-28 DIAGNOSIS — Z90710 Acquired absence of both cervix and uterus: Secondary | ICD-10-CM | POA: Insufficient documentation

## 2017-09-28 DIAGNOSIS — I728 Aneurysm of other specified arteries: Secondary | ICD-10-CM | POA: Insufficient documentation

## 2017-09-28 MED ORDER — IOPAMIDOL (ISOVUE-300) INJECTION 61%
INTRAVENOUS | Status: AC
  Filled 2017-09-28: qty 100

## 2017-09-28 MED ORDER — IOPAMIDOL (ISOVUE-300) INJECTION 61%
100.0000 mL | Freq: Once | INTRAVENOUS | Status: AC | PRN
  Administered 2017-09-28: 80 mL via INTRAVENOUS

## 2017-09-28 NOTE — Telephone Encounter (Signed)
It's worth trying I also recommend imodium AD 4 mg TIDprn to stop her loose BM

## 2017-09-28 NOTE — Telephone Encounter (Signed)
-----   Message from Heath Lark, MD sent at 09/28/2017 11:18 AM EDT ----- Regarding: CT ok pls let her know CT looks ok, no abscess or fistula. OK to resume chemo next week I can see her with treatment probably next Friday to review with her (4/5). If she is willing to get labs done on 4/3 (already scheduled), proceed with labs on 4/3 and then place scheduling msg for see me and chemo on 4/5 ----- Message ----- From: Interface, Rad Results In Sent: 09/28/2017  11:04 AM To: Heath Lark, MD

## 2017-09-28 NOTE — Telephone Encounter (Signed)
Called and given below message. Scheduling message sent. 

## 2017-09-28 NOTE — Telephone Encounter (Signed)
Called and given below message. She is okay to proceed with chemotherapy on 4/5.   She is concerned about a lot of mucus in her stools. She has up to 15 loose bm's a day. She will suddenly have the urge to have bm and have cramping. She sometimes just passes mucus with no stool, She has not started the probiotic, she was not sure that it would even help.

## 2017-10-02 ENCOUNTER — Telehealth: Payer: Self-pay | Admitting: Hematology and Oncology

## 2017-10-02 NOTE — Telephone Encounter (Signed)
Per 4/2 sch msg moved appointments for this week to next week lab/flush/fu/chemo. Lab for 4/3 remains as scheduled due to it is needed for 4/3 genetics appointment. Lab for NG will be done 4/10 with f/u and chemo. Also added injection as there is an injection included in care plan.  Spoke with patient re change and appointments for 4/10 and 4/12. Also confirmed 4/3 genetics/lab.

## 2017-10-03 ENCOUNTER — Inpatient Hospital Stay: Payer: Medicare Other | Attending: Gynecologic Oncology | Admitting: Genetic Counselor

## 2017-10-03 ENCOUNTER — Inpatient Hospital Stay: Payer: Medicare Other

## 2017-10-03 ENCOUNTER — Encounter: Payer: Self-pay | Admitting: Genetic Counselor

## 2017-10-03 ENCOUNTER — Other Ambulatory Visit: Payer: Medicare Other

## 2017-10-03 DIAGNOSIS — Z8042 Family history of malignant neoplasm of prostate: Secondary | ICD-10-CM

## 2017-10-03 DIAGNOSIS — C562 Malignant neoplasm of left ovary: Secondary | ICD-10-CM | POA: Diagnosis not present

## 2017-10-03 DIAGNOSIS — Z808 Family history of malignant neoplasm of other organs or systems: Secondary | ICD-10-CM

## 2017-10-03 DIAGNOSIS — Z803 Family history of malignant neoplasm of breast: Secondary | ICD-10-CM | POA: Diagnosis not present

## 2017-10-03 DIAGNOSIS — Z800 Family history of malignant neoplasm of digestive organs: Secondary | ICD-10-CM

## 2017-10-03 DIAGNOSIS — Z8052 Family history of malignant neoplasm of bladder: Secondary | ICD-10-CM

## 2017-10-03 DIAGNOSIS — Z1379 Encounter for other screening for genetic and chromosomal anomalies: Secondary | ICD-10-CM

## 2017-10-03 NOTE — Progress Notes (Signed)
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 Family history of breast cancer   3. Family history of prostate cancer   4. Family history of melanoma   5. Family history of bladder cancer   6. Family history of colon cancer      HISTORY OF PRESENT ILLNESS:   Ms. Qualls, a 72 y.o. female, was seen for a [place] cancer genetics consultation at the request of Dr. Alvy Bimler due to a personal and family history of cancer.  Ms. Tozzi presents to clinic today to discuss the possibility of a hereditary predisposition to cancer, genetic testing, and to further clarify her future cancer risks, as well as potential cancer risks for family members.   In 2018, at the age of 79, Ms. Cohenour was diagnosed with cancer of the left ovary. This was treated with surgery and chemotherapy.       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  She presented for evaluation of an ultrasound was performed which demonstrated a uterus measuring 6 x 5 cm with a 1.3 mm endometrial stripe.  Multiple heterogeneous pelvic masses were appreciated the right measured 8 x 5 cm and another measured 10 cm just posterior to the uterus CA-125 was obtained and returned a value of 404.8. She also reports early satiety for approximately 2 months and a 10 pound weight loss over the last year.  She also reports mild abdominal bloating and sensation of pelvic heaviness      04/25/2017 Imaging    Ct abdomen and pelvis: 1. Appearance compatible with ovarian malignancy with widespread peritoneal, mesenteric, and omental metastatic disease. Specifically, a 12.7 cm primarily solid and partially cystic mass above the uterus  probably arises from the right ovary and is associated with omental caking, complex ascites with peritoneal metastatic deposits, and mesenteric deposits of tumor. 2. Mild intrahepatic biliary dilatation. The extrahepatic biliary tree is within normal limits. 3.  Aortic Atherosclerosis (ICD10-I70.0).      05/01/2017 Procedure    Successful CT-guided left abdominal omental mass 18 gauge core biopsies      05/01/2017 Pathology Results    Soft Tissue Needle Core Biopsy, Left omental - METASTATIC CARCINOMA, SEE COMMENT. 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 The catheter is ready for immediate use.      05/16/2017 -  Chemotherapy    She received neoadjuvant chemo with carbo/taxol x 3 cycles followed by 3 more cycles of chemo      06/25/2017 Tumor Marker    Patient's tumor was tested for the following markers: CA-125 Results of the tumor marker test revealed 64.5      07/09/2017 Imaging    Decreased size of cystic and solid central pelvic mass since previous study.  Near complete resolution of peritoneal metastatic disease and ascites since previous study.  No new or progressive metastatic disease identified. No evidence of metastatic disease within the thorax.  Large stool burden noted; recommend clinical correlation for possible constipation.      07/24/2017 Surgery    Operation:  Robotic-assisted laparoscopic total hysterectomy with bilateral salpingoophorectomy, omentectomy, radical tumor debulking  Surgeon: [name]  Operative Findings:  : 3cm left omental/splenic flexure tumor nodule adherent to transverse colon. 10cm pelvic tumor from left ovary, adherent to uterus and rectum, necrotic. Left retroperitoneal infiltration.  Tumor rind remaining on posterior cul de sac and distal rectum - very  thin, <1cm thick, representing optimal cytoreductive procedure.         07/24/2017 Pathology Results    1. Omentum, resection for tumor - METASTATIC CARCINOMA. 2. Uterus +/- tubes/ovaries, neoplastic - LEFT OVARY: HIGH GRADE SEROUS CARCINOMA, SPANNING APPROXIMATELY 9.9 CM. TUMOR INVOLVES SURFACE. TREATMENT EFFECT PRESENT. SEE ONCOLOGY TABLE. - RIGHT OVARY: INVOLVED BY HIGH GRADE SEROUS CARCINOMA. - BILATERAL FALLOPIAN TUBES: INVOLVED BY HIGH GRADE SEROUS CARCINOMA. - UTERUS: -ENDOMETRIUM: ENDOMETRIAL TYPE POLYP. INACTIVE ENDOMETRIUM. NO HYPERPLASIA OR MALIGNANCY. -MYOMETRIUM: LEIOMYOMATA. NO MALIGNANCY. -SEROSA: INVOLVED BY HIGH GRADE SEROUS CARCINOMA. - CERVIX: HIGH GRADE SEROUS CARCINOMA PRESENT AT ANTERIOR CERVICAL SOFT TISSUE MARGIN. Microscopic Comment 2. OVARY Specimen(s): Uterus, cervix, bilateral ovaries and fallopian tubes, omentum. Procedure: (including lymph node sampling): Total hysterectomy with bilateral salpingo-oophorectomy. Omentectomy. Primary tumor site (including laterality): Left ovary. Ovarian surface involvement: Present. Ovarian capsule intact without fragmentation: Disrupted. Maximum tumor size (cm): Estimated 9.9 cm. Histologic type: High grade serous carcinoma. Grade: High grade (FIGO N/A) Peritoneal implants: (specify invasive or non-invasive): Present (>2 cm) as in part #1. Pelvic extension (list additional structures on separate lines and if involved): Right ovary, bilateral fallopian tubes, cervical soft tissue. Lymph nodes: number examined 0 ; number positive 0 TNM code: ypT3c, ypNX, ypMX FIGO Stage (based on pathologic findings, needs clinical correlation): IIIC Comments: None.      07/24/2017 - 07/26/2017 Hospital Admission    She was admitted to the hospital for surgical debulking, complicated by anemia requiring blood transfusion.      07/29/2017 - 08/02/2017 Hospital Admission    She was admitted to the hospital for management of hematoma  and bladder injury.      07/29/2017 Imaging    1. Blood products and large volume hemostatic material in the pelvis correlating with operative history. Sterility is indeterminate by imaging. No organized collection for drainage. 2. High-density posterior to the pelvic collection was present preoperatively and is attributed to mass calcification. No bowel leak is seen with oral contrast reaching the transverse colon. 3. Diffuse postoperative subcutaneous gas. Small hematomas at bilateral abdominal wall port sites. 4. Right nephrectomy.       07/31/2017 Procedure    Mild left hydronephrosis      08/01/2017 Imaging    Extravasation contrast from the posterior aspect of the bladder into the adjacent tissues      08/22/2017 Imaging    Persistent posterior left bladder wall leak near the midline, with contrast filling of a well-defined crescent-shaped extraluminal contrast collection posterior to the left bladder wall, and with vaginal fistulization demonstrated at the inferior margin of this collection      08/22/2017 Tumor Marker    Patient's tumor was tested for the following markers: CA-125 Results of the tumor marker test revealed 13.5      09/04/2017 Tumor Marker    Patient's tumor was tested for the following markers: CA-125 Results of the tumor marker test revealed 12.5      09/25/2017 Tumor Marker    Patient's tumor was tested for the following markers: CA-125 Results of the tumor marker test revealed 11.3        HORMONAL RISK FACTORS:  Menarche was at age 29.  First live birth at age 25.  OCP use for approximately 5-10 years.  Ovaries intact: no.  Hysterectomy: yes.  Menopausal status: postmenopausal.  HRT use: 2 years. Colonoscopy: yes; normal. Mammogram within the last year: yes. Number of breast biopsies: 1. Up to date with pelvic exams:  yes. Any excessive radiation exposure in the past:  no  Past Medical History:  Diagnosis Date  . Anxiety     claustrophobia, fear of heights  . Cancer ([place])    ovarian cancer   . Complication of anesthesia    remembers severe sore throat after kidney surgery and feeling dizzy  . 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 Left shoulder pain   . Osteoporosis   . Premature atrial complexes    see EKG in epic   . Skin lesion    "pre-cancer" -past hx.    Past Surgical History:  Procedure Laterality Date  . ABDOMINAL HYSTERECTOMY    . BREAST SURGERY Right    lumpectomy-benign  . CERVIX SURGERY     "freezing" dysplasia  . COLONOSCOPY    . DEBULKING N/A 07/24/2017   Procedure: RADICAL TUMOR DEBULKING;  Surgeon: Everitt Amber, MD;  Location: [facility];  Service: Gynecology;  Laterality: N/A;  . HYSTEROSCOPY    . IR FLUORO GUIDE PORT INSERTION RIGHT  05/15/2017  . IR US GUIDE VASC ACCESS RIGHT  05/15/2017  . NEPHRECTOMY  01/1998   right--donation  . OMENTECTOMY N/A 07/24/2017   Procedure: OMENTECTOMY;  Surgeon: Everitt Amber, MD;  Location: [facility];  Service: Gynecology;  Laterality: N/A;  . ROBOTIC ASSISTED TOTAL HYSTERECTOMY WITH BILATERAL SALPINGO OOPHERECTOMY N/A 07/24/2017   Procedure: XI ROBOTIC ASSISTED TOTAL HYSTERECTOMY WITH BILATERAL SALPINGO OOPHORECTOMY;  Surgeon: Everitt Amber, MD;  Location: [facility];  Service: Gynecology;  Laterality: N/A;  . SHOULDER ARTHROSCOPY Left    "impingement" "frozen shoulder"  . SHOULDER ARTHROSCOPY WITH SUBACROMIAL DECOMPRESSION Right 10/23/2013   Procedure: RIGHT SHOULDER ARTHROSCOPY WITH SUBACROMIAL DECOMPRESSION, EVULATION UNDER ANESTHESIA  AND MANIPULATION UNDER ANESTHESIA;  Surgeon: Johnn Hai, MD;  Location: [facility];  Service: Orthopedics;  Laterality: Right;    Social History   Socioeconomic History  . Marital status: Divorced    Spouse name: Not on file  . Number of children: 4  . Years of education: Not on file  . Highest education level: Not on file  Occupational History  . Occupation: retired-- Manufacturing engineer  Social Needs  .  Financial resource strain: Not on file  . Food insecurity:    Worry: Not on file    Inability: Not on file  . Transportation needs:    Medical: Not on file    Non-medical: Not on file  Tobacco Use  . Smoking status: Never Smoker  . Smokeless tobacco: Never Used  Substance and Sexual Activity  . Alcohol use: Yes    Alcohol/week: 0.6 oz    Types: 1 Cans of beer per week    Comment: no alcohol comsumption in past month   . Drug use: No  . Sexual activity: Not Currently    Partners: Male  Lifestyle  . Physical activity:    Days per week: Not on file    Minutes per session: Not on file  . Stress: Not on file  Relationships  . Social connections:    Talks on phone: Not on file    Gets together: Not on file    Attends religious service:  Not on file    Active member of club or organization: Not on file    Attends meetings of clubs or organizations: Not on file    Relationship status: Not on file  Other Topics Concern  . Not on file  Social History Narrative  . Not on file     FAMILY HISTORY:  We obtained a detailed, 4-generation family history.  Significant diagnoses are listed below: Family History  Problem Relation Age of Onset  . Heart disease Mother        mitral valve prolapse  . Hypertension Mother   . Hyperlipidemia Mother   . Cancer Mother 41       breast  . Osteopenia Mother   . Heart disease Father        bypass  . Hypertension Father   . Hyperlipidemia Father   . Cancer Father 60       bladder,  prostate  . Melanoma Father 48  . Hyperlipidemia Sister   . Colon cancer Maternal Grandfather        dx 75-80  . Kidney cancer Maternal Aunt        dx in her 33s  . Stroke Paternal Grandfather     The patient has three sons and a daughter who are cancer free.  She has a brother and sister who are cancer free.  Her parents are both deceased.   The patient's mother had breast cancer at 62 and died at 2.  She had a brother and sister.  The sister had kidney  cancer and bone cancer.  The maternal grandparents are deceased.  The grandfather had colon cancer in his 53's.  The patient's father had prostate cancer at 51. Bladder cancer at 24 and melanoma at 3.  He has one brother who died but did not have cancer. The paternal grandparents are deceased.  Ms. Fini is unaware of previous family history of genetic testing for hereditary cancer risks. Patient's maternal ancestors are of [place] descent, and paternal ancestors are of [place] descent. There is no reported Ashkenazi Jewish ancestry. There is no known consanguinity.  GENETIC COUNSELING ASSESSMENT: RILEIGH KAWASHIMA is a 72 y.o. female with a personal and family history of cancer which is somewhat suggestive of a hereditary cancer syndrome and predisposition to cancer. We, therefore, discussed and recommended the following at today's visit.   DISCUSSION: We discussed that about 15-20% of ovarian cancer is hereditary with most cases due to BRCA mutations.  Other genes associated with hereditary ovarian cancer include Lynch syndrome, BRIP1, RAD51C and RAD51D.  We reviewed the characteristics, features and inheritance patterns of hereditary cancer syndromes. We also discussed genetic testing, including the appropriate family members to test, the process of testing, insurance coverage and turn-around-time for results. We discussed the implications of a negative, positive and/or variant of uncertain significant result. We recommended Ms. Elem pursue genetic testing for the [facility] gene panel. The [facility] gene panel offered by [employer] includes sequencing and deletion/duplication testing of the following 28 genes: APC, ATM, BARD1, BMPR1A, BRCA1, BRCA2, BRIP1, CHD1, CDK4, CDKN2A, CHEK2, EPCAM (large rearrangement only), MLH1, MSH2, MSH6, MUTYH, NBN, PALB2, PMS2, PTEN, RAD51C, RAD51D, SMAD4, STK11, and TP53. Sequencing was performed for select regions of POLE and POLD1, and large rearrangement  analysis was performed for select regions of GREM1.   Based on Ms. Sturgill's personal and family history of cancer, she meets medical criteria for genetic testing. Despite that she meets criteria, she may still have an  out of pocket cost. We discussed that if her out of pocket cost for testing is over $100, the laboratory will call and confirm whether she wants to proceed with testing.  If the out of pocket cost of testing is less than $100 she will be billed by the genetic testing laboratory.   We discussed that genetic testing through [facility] will test for hereditary mutations that could explain her diagnosis of cancer.  However, homologous recombination testing (HRD) is genetic testing performed on her tumor that can determine genetic changes that could influence her management.  HRD testing is performed in tandem with genetic testing, and typically at no additional cost.   PLAN: After considering the risks, benefits, and limitations, Ms. Kuna  provided informed consent to pursue genetic testing and the blood sample was sent to [facility] for analysis of the [facility] panel and HRD testing. Results should be available within approximately 2-3 weeks' time, at which point they will be disclosed by telephone to Ms. Bagdasarian, as will any additional recommendations warranted by these results. Ms. Drakeford will receive a summary of her genetic counseling visit and a copy of her results once available. This information will also be available in Epic. We encouraged Ms. Ludtke to remain in contact with cancer genetics annually so that we can continuously update the family history and inform her of any changes in cancer genetics and testing that may be of benefit for her family. Ms. Wendt questions were answered to her satisfaction today. Our contact information was provided should additional questions or concerns arise.  Lastly, we encouraged Ms. Leu to remain in contact with cancer genetics  annually so that we can continuously update the family history and inform her of any changes in cancer genetics and testing that may be of benefit for this family.   Ms.  Emile questions were answered to her satisfaction today. Our contact information was provided should additional questions or concerns arise. Thank you for the referral and allowing [place] to share in the care of your patient.   Jimmie Rueter P. Florene Glen, [place], [facility] Certified Genetic Counselor [email or web address] phone: [phone]  The patient was seen for a total of 45 minutes in face-to-face genetic counseling.  This patient was discussed with Drs. Magrinat, Lindi Adie and/or Burr Medico who agrees with the above.    _______________________________________________________________________ For Office Staff:  Number of people involved in session: 3 Was an Intern/ student involved with case: no

## 2017-10-05 ENCOUNTER — Ambulatory Visit: Payer: Medicare Other | Admitting: Hematology and Oncology

## 2017-10-09 ENCOUNTER — Telehealth: Payer: Self-pay | Admitting: *Deleted

## 2017-10-09 NOTE — Telephone Encounter (Signed)
Pt called to say she has been having diarrhea, feeling bad and a temperature of 101. Is taking tylenol and eating OK. Wanted to know if she should cancel chemo.   Dr Alvy Bimler said yes, cancel chemo this week. Pt does not feel she is dehydrated- we discussed IVF if needed.  Will call us on Monday to let us know how she is feeling.

## 2017-10-10 ENCOUNTER — Other Ambulatory Visit: Payer: Medicare Other

## 2017-10-10 ENCOUNTER — Ambulatory Visit: Payer: Medicare Other | Admitting: Hematology and Oncology

## 2017-10-10 ENCOUNTER — Telehealth: Payer: Self-pay | Admitting: Internal Medicine

## 2017-10-10 ENCOUNTER — Ambulatory Visit: Payer: Medicare Other

## 2017-10-10 ENCOUNTER — Encounter: Payer: Self-pay | Admitting: Hematology and Oncology

## 2017-10-10 DIAGNOSIS — R197 Diarrhea, unspecified: Secondary | ICD-10-CM

## 2017-10-10 NOTE — Telephone Encounter (Signed)
Patient with diarrhea 5-20 times a day for several weeks.  She has low grade fever today of 99.8 but yesterday was T max 101.  She is undergoing chemo for ovarian CA.  Her last  2 chemo had to be cancelled due to diarrhea, fever, and elevated white blood cell count.  Her weight is down and she is not feeling well.  She will come in on Monday to see Dr. Carlean Purl.  Her stools are loose to watery and all through out the day.  No real correlation to eating.  Excessive mucus, no bleeding.  Dr. Carlean Purl would you like any orders prior to her appt with you on Monday?

## 2017-10-10 NOTE — Telephone Encounter (Signed)
Patient notified She will come pick up containers and recipes

## 2017-10-10 NOTE — Telephone Encounter (Signed)
She needs C diff by PCR (I know was neg in Feb - want to repeat) and also needs GI pathogen panel.  Please send her oral rehydration info also - perhaps have her pick ip a copy at desk.  ORAL REHYDRATION SOLUTION RECIPES   Sugar and salt water   . 1 quart water .  teaspoon salt . 6 teaspoons sugar . Optional: Crystal Light to taste (especially lemonade or orange-pineapple flavors)   Gatorade G2   . 4 cups Gatorade G2 (or one, 32 ounce bottle) . 1/2 teaspoon salt   Chicken Broth   . 4 cups water . 1 dry chicken broth cube .  teaspoon salt . 2 tablespoon sugar OR . 2 cups liquid broth . 2 cups water . 2 tablespoon sugar  Tomato Juice   . 2  cups tomato juice . 1  cups water  Homemade Cereal Based  .  cup dry, precooked baby rice cereal . 2 cups water .  teaspoon salt . Combine ingredients and mix until well dissolved and smooth. Refrigerate. Solution should be thick, but pourable and drinkable.

## 2017-10-11 ENCOUNTER — Other Ambulatory Visit: Payer: Medicare Other

## 2017-10-11 DIAGNOSIS — R197 Diarrhea, unspecified: Secondary | ICD-10-CM

## 2017-10-12 ENCOUNTER — Ambulatory Visit: Payer: Medicare Other

## 2017-10-15 ENCOUNTER — Ambulatory Visit: Payer: Medicare Other | Admitting: Internal Medicine

## 2017-10-15 ENCOUNTER — Encounter: Payer: Self-pay | Admitting: Hematology and Oncology

## 2017-10-15 ENCOUNTER — Telehealth: Payer: Self-pay

## 2017-10-15 ENCOUNTER — Telehealth: Payer: Self-pay | Admitting: Internal Medicine

## 2017-10-15 LAB — GASTROINTESTINAL PATHOGEN PANEL PCR
C. difficile Tox A/B, PCR: DETECTED — AB
CAMPYLOBACTER, PCR: NOT DETECTED
CRYPTOSPORIDIUM, PCR: NOT DETECTED
E COLI (STEC) STX1/STX2, PCR: NOT DETECTED
E COLI 0157, PCR: NOT DETECTED
E coli (ETEC) LT/ST PCR: NOT DETECTED
Giardia lamblia, PCR: NOT DETECTED
Norovirus, PCR: NOT DETECTED
ROTAVIRUS, PCR: NOT DETECTED
SALMONELLA, PCR: NOT DETECTED
Shigella, PCR: NOT DETECTED

## 2017-10-15 LAB — CLOSTRIDIUM DIFFICILE TOXIN B, QUALITATIVE, REAL-TIME PCR: Toxigenic C. Difficile by PCR: DETECTED — AB

## 2017-10-15 MED ORDER — VANCOMYCIN 50 MG/ML ORAL SOLUTION: 125.0000 mg | Freq: Four times a day (QID) | ORAL | 0 refills | Status: DC

## 2017-10-15 MED ORDER — SACCHAROMYCES BOULARDII 250 MG PO CAPS: 250.0000 mg | ORAL_CAPSULE | Freq: Two times a day (BID) | ORAL | 1 refills | Status: AC

## 2017-10-15 NOTE — Telephone Encounter (Signed)
Patient notified

## 2017-10-15 NOTE — Telephone Encounter (Signed)
Patient states she is at pharmacy and they do not have medication vancomycin. Pharmacy told patient nurse could call and do it verbally.

## 2017-10-15 NOTE — Telephone Encounter (Signed)
Barb Merino, [facility] has taken care of this matter.

## 2017-10-15 NOTE — Telephone Encounter (Signed)
I left a detailed message for the patient that c-diff was positive and to please call me before coming to the appt taoday.  Rx sent per Dr. Carlean Purl for vancomycin 125 mg 1 po qid for 10 days.  Patient should call back back with an update on Thursday.    Left message for patient to call back

## 2017-10-15 NOTE — Telephone Encounter (Signed)
Called and given below message from Dr. Alvy Bimler. Verbalized understanding.

## 2017-10-15 NOTE — Telephone Encounter (Signed)
-----   Message from Gatha Mayer, MD sent at 10/15/2017 10:31 AM EDT ----- Regarding: Florastor Please ask her to do Florastor 250 mg bid also

## 2017-10-15 NOTE — Telephone Encounter (Signed)
Hi Colleen Mckinney,  Tell her we will not proceed with chemo until she has completely healed from her treatment. I will schedule follow-up appointment in 2 weeks

## 2017-10-15 NOTE — Telephone Encounter (Signed)
Patient notified of the positive c-diff results and new rx.  She is advised to continue fluid replacement recipes.  She is still feeling weak. She is advised to call on Thursday with an update. She is asking if she can have her chemo infusion.  She is advised that she will need to speak with oncology and they will be the ones to determine when it is ok to have her next infusion.

## 2017-10-16 NOTE — Progress Notes (Signed)
C diff Tx started w/ vancomycin 125 mg qid yesterday She will call to f/u in a few days re: response

## 2017-10-18 NOTE — Telephone Encounter (Signed)
Pt called to update on her progress. She stated that this morning she woke up with more energy and had diarrhea only once.

## 2017-10-19 ENCOUNTER — Ambulatory Visit: Payer: Self-pay | Admitting: Genetic Counselor

## 2017-10-19 ENCOUNTER — Encounter: Payer: Self-pay | Admitting: Genetic Counselor

## 2017-10-19 ENCOUNTER — Telehealth: Payer: Self-pay | Admitting: Genetic Counselor

## 2017-10-19 DIAGNOSIS — Z800 Family history of malignant neoplasm of digestive organs: Secondary | ICD-10-CM

## 2017-10-19 DIAGNOSIS — C562 Malignant neoplasm of left ovary: Secondary | ICD-10-CM

## 2017-10-19 DIAGNOSIS — Z1379 Encounter for other screening for genetic and chromosomal anomalies: Secondary | ICD-10-CM | POA: Insufficient documentation

## 2017-10-19 DIAGNOSIS — Z8052 Family history of malignant neoplasm of bladder: Secondary | ICD-10-CM

## 2017-10-19 DIAGNOSIS — Z803 Family history of malignant neoplasm of breast: Secondary | ICD-10-CM

## 2017-10-19 DIAGNOSIS — Z808 Family history of malignant neoplasm of other organs or systems: Secondary | ICD-10-CM

## 2017-10-19 DIAGNOSIS — Z8042 Family history of malignant neoplasm of prostate: Secondary | ICD-10-CM

## 2017-10-19 NOTE — Telephone Encounter (Signed)
Revealed negative genetic testing.  Discussed that we do not know why she has ovarian cancer or why there is cancer in the family. It could be due to a different gene that we are not testing, or maybe our current technology may not be able to pick something up.  It will be important for her to keep in contact with genetics to keep up with whether additional testing may be needed.  Revealed that there were two VUS identified, but that this is still considered a normal result.  HRD testing was also negative.

## 2017-10-19 NOTE — Progress Notes (Signed)
HPI:  Ms. Buley was previously seen in the [facility] due to a personal and family history of cancer and concerns regarding a hereditary predisposition to cancer. Please refer to our prior cancer genetics clinic note for more information regarding Ms. Reim's medical, social and family histories, and our assessment and recommendations, at the time. Ms. Mecum recent genetic test results were disclosed to her, as were recommendations warranted by these results. These results and recommendations are discussed in more detail below.  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  She presented for evaluation of an ultrasound was performed which demonstrated a uterus measuring 6 x 5 cm with a 1.3 mm endometrial stripe.  Multiple heterogeneous pelvic masses were appreciated the right measured 8 x 5 cm and another measured 10 cm just posterior to the uterus CA-125 was obtained and returned a value of 404.8. She also reports early satiety for approximately 2 months and a 10 pound weight loss over the last year.  She also reports mild abdominal bloating and sensation of pelvic heaviness      04/25/2017 Imaging    Ct abdomen and pelvis: 1. Appearance compatible with ovarian malignancy with widespread peritoneal, mesenteric, and omental metastatic disease. Specifically, a 12.7 cm primarily solid and partially cystic mass above the uterus probably arises from the right ovary and is associated with omental caking, complex ascites with peritoneal metastatic deposits, and mesenteric deposits of tumor. 2. Mild intrahepatic biliary dilatation. The extrahepatic biliary tree is within normal limits. 3.  Aortic Atherosclerosis (ICD10-I70.0).      05/01/2017 Procedure    Successful CT-guided left abdominal omental mass 18 gauge core biopsies      05/01/2017 Pathology Results    Soft Tissue Needle Core Biopsy, Left omental - METASTATIC CARCINOMA, SEE COMMENT. 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 The catheter is ready for immediate use.      05/16/2017 -  Chemotherapy    She received neoadjuvant chemo with carbo/taxol x 3 cycles followed by 3 more cycles of chemo      06/25/2017 Tumor Marker    Patient's tumor was tested for the following markers: CA-125 Results of the tumor marker test revealed 64.5      07/09/2017 Imaging    Decreased size of cystic and solid central pelvic mass since previous study.  Near complete resolution of peritoneal metastatic disease and ascites since previous study.  No new or progressive metastatic disease identified. No evidence of metastatic disease within the thorax.  Large stool burden noted; recommend clinical correlation for possible constipation.      07/24/2017 Surgery    Operation: Robotic-assisted laparoscopic total hysterectomy with bilateral salpingoophorectomy, omentectomy, radical tumor debulking  Surgeon: [name]  Operative Findings:  : 3cm left omental/splenic flexure tumor nodule adherent to transverse colon. 10cm pelvic tumor from left ovary, adherent to uterus and rectum, necrotic. Left retroperitoneal infiltration.  Tumor rind remaining on posterior cul de sac and distal rectum - very thin, <1cm thick, representing optimal cytoreductive procedure.         07/24/2017 Pathology Results    1. Omentum, resection for tumor - METASTATIC CARCINOMA. 2. Uterus +/- tubes/ovaries, neoplastic -  LEFT OVARY: HIGH GRADE SEROUS CARCINOMA, SPANNING APPROXIMATELY 9.9 CM. TUMOR INVOLVES SURFACE. TREATMENT EFFECT PRESENT. SEE ONCOLOGY TABLE. - RIGHT OVARY: INVOLVED BY HIGH GRADE SEROUS  CARCINOMA. - BILATERAL FALLOPIAN TUBES: INVOLVED BY HIGH GRADE SEROUS CARCINOMA. - UTERUS: -ENDOMETRIUM: ENDOMETRIAL TYPE POLYP. INACTIVE ENDOMETRIUM. NO HYPERPLASIA OR MALIGNANCY. -MYOMETRIUM: LEIOMYOMATA. NO MALIGNANCY. -SEROSA: INVOLVED BY HIGH GRADE SEROUS CARCINOMA. - CERVIX: HIGH GRADE SEROUS CARCINOMA PRESENT AT ANTERIOR CERVICAL SOFT TISSUE MARGIN. Microscopic Comment 2. OVARY Specimen(s): Uterus, cervix, bilateral ovaries and fallopian tubes, omentum. Procedure: (including lymph node sampling): Total hysterectomy with bilateral salpingo-oophorectomy. Omentectomy. Primary tumor site (including laterality): Left ovary. Ovarian surface involvement: Present. Ovarian capsule intact without fragmentation: Disrupted. Maximum tumor size (cm): Estimated 9.9 cm. Histologic type: High grade serous carcinoma. Grade: High grade (FIGO N/A) Peritoneal implants: (specify invasive or non-invasive): Present (>2 cm) as in part #1. Pelvic extension (list additional structures on separate lines and if involved): Right ovary, bilateral fallopian tubes, cervical soft tissue. Lymph nodes: number examined 0 ; number positive 0 TNM code: ypT3c, ypNX, ypMX FIGO Stage (based on pathologic findings, needs clinical correlation): IIIC Comments: None.      07/24/2017 - 07/26/2017 Hospital Admission    She was admitted to the hospital for surgical debulking, complicated by anemia requiring blood transfusion.      07/29/2017 - 08/02/2017 Hospital Admission    She was admitted to the hospital for management of hematoma and bladder injury.      07/29/2017 Imaging    1. Blood products and large volume hemostatic material in the pelvis correlating with operative history. Sterility is indeterminate by imaging. No organized collection for drainage. 2. High-density posterior to the pelvic collection was present preoperatively and is attributed to mass calcification. No bowel leak is seen with oral contrast reaching  the transverse colon. 3. Diffuse postoperative subcutaneous gas. Small hematomas at bilateral abdominal wall port sites. 4. Right nephrectomy.       07/31/2017 Procedure    Mild left hydronephrosis      08/01/2017 Imaging    Extravasation contrast from the posterior aspect of the bladder into the adjacent tissues      08/22/2017 Imaging    Persistent posterior left bladder wall leak near the midline, with contrast filling of a well-defined crescent-shaped extraluminal contrast collection posterior to the left bladder wall, and with vaginal fistulization demonstrated at the inferior margin of this collection      08/22/2017 Tumor Marker    Patient's tumor was tested for the following markers: CA-125 Results of the tumor marker test revealed 13.5      09/04/2017 Tumor Marker    Patient's tumor was tested for the following markers: CA-125 Results of the tumor marker test revealed 12.5      09/25/2017 Tumor Marker    Patient's tumor was tested for the following markers: CA-125 Results of the tumor marker test revealed 11.3      10/17/2017 Genetic Testing    ATM c.8428A>C (p.Lys2810Gln) and AXIN2 c.1235A>G (p.Asn412Ser) VUS identified on the [facility] My[facility] panel.  The [facility] gene panel offered by [employer] includes sequencing and deletion/duplication testing of the following 28 genes: APC, ATM, BARD1, BMPR1A, BRCA1, BRCA2, BRIP1, CHD1, CDK4, CDKN2A, CHEK2, EPCAM (large rearrangement only), MLH1, MSH2, MSH6, MUTYH, NBN, PALB2, PMS2, PTEN, RAD51C, RAD51D, SMAD4, STK11, and TP53. Sequencing was performed for select regions of POLE and POLD1, and large rearrangement analysis was performed for select regions of GREM1. The report date is October 15, 2017.  HRD testing was  negative.  There were no BRCA mutations and the genomic instability was negative.  The report date is October 17, 2017.        FAMILY HISTORY:  We obtained a detailed, 4-generation family history.  Significant  diagnoses are listed below: Family History  Problem Relation Age of Onset  . Heart disease Mother        mitral valve prolapse  . Hypertension Mother   . Hyperlipidemia Mother   . Cancer Mother 16       breast  . Osteopenia Mother   . Heart disease Father        bypass  . Hypertension Father   . Hyperlipidemia Father   . Cancer Father 97       bladder,  prostate  . Melanoma Father 44  . Hyperlipidemia Sister   . Colon cancer Maternal Grandfather        dx 75-80  . Kidney cancer Maternal Aunt        dx in her 33s  . Stroke Paternal Grandfather     The patient has three sons and a daughter who are cancer free.  She has a brother and sister who are cancer free.  Her parents are both deceased.   The patient's mother had breast cancer at 45 and died at 67.  She had a brother and sister.  The sister had kidney cancer and bone cancer.  The maternal grandparents are deceased.  The grandfather had colon cancer in his 19's.  The patient's father had prostate cancer at 71. Bladder cancer at 57 and melanoma at 52.  He has one brother who died but did not have cancer. The paternal grandparents are deceased.  Ms. Albany is unaware of previous family history of genetic testing for hereditary cancer risks. Patient's maternal ancestors are of [place] descent, and paternal ancestors are of [place] descent. There is no reported Ashkenazi Jewish ancestry. There is no known consanguinity.  GENETIC TEST RESULTS: Genetic testing reported out on October 15, 2017 through the [facility] cancer panel found no deleterious mutations.  The [facility] gene panel offered by [employer] includes sequencing and deletion/duplication testing of the following 28 genes: APC, ATM, BARD1, BMPR1A, BRCA1, BRCA2, BRIP1, CHD1, CDK4, CDKN2A, CHEK2, EPCAM (large rearrangement only), MLH1, MSH2, MSH6, MUTYH, NBN, PALB2, PMS2, PTEN, RAD51C, RAD51D, SMAD4, STK11, and TP53. Sequencing was performed for select regions  of POLE and POLD1, and large rearrangement analysis was performed for select regions of GREM1.   The test report has been scanned into EPIC and is located under the Molecular Pathology section of the Results Review tab.    We discussed with Ms. Singley that since the current genetic testing is not perfect, it is possible there may be a gene mutation in one of these genes that current testing cannot detect, but that chance is small.  We also discussed, that it is possible that another gene that has not yet been discovered, or that we have not yet tested, is responsible for the cancer diagnoses in the family, and it is, therefore, important to remain in touch with cancer genetics in the future so that we can continue to offer Ms. Bach the most up to date genetic testing.   Genetic testing did detect two Variants of Unknown Significance - one in the ATM gene called c.8428A>C and the other in the AXIN2 gene called c.1235A>G. At this time, it is unknown if this variant is associated with increased cancer risk or if this  is a normal finding, but most variants such as this get reclassified to being inconsequential. It should not be used to make medical management decisions. With time, we suspect the lab will determine the significance of this variant, if any. If we do learn more about it, we will try to contact Ms. Hoxworth to discuss it further. However, it is important to stay in touch with [place] periodically and keep the address and phone number up to date.  Homologous Recombination Deficiency testing was performed on the ovarian tumor.  This included performing BRCA genetic testing and genomic instability.  This testing was negative.     CANCER SCREENING RECOMMENDATIONS: This result is reassuring and indicates that Ms. Apperson likely does not have an increased risk for a future cancer due to a mutation in one of these genes. This normal test also suggests that Ms. Defenbaugh's cancer was most likely not due to an  inherited predisposition associated with one of these genes.  Most cancers happen by chance and this negative test suggests that her cancer falls into this category.  We, therefore, recommended she continue to follow the cancer management and screening guidelines provided by her oncology and primary healthcare provider.   An individual's cancer risk and medical management are not determined by genetic test results alone. Overall cancer risk assessment incorporates additional factors, including personal medical history, family history, and any available genetic information that may result in a personalized plan for cancer prevention and surveillance.  RECOMMENDATIONS FOR FAMILY MEMBERS:  Women in this family might be at some increased risk of developing cancer, over the general population risk, simply due to the family history of cancer.  We recommended women in this family have a yearly mammogram beginning at age 27, or 69 years younger than the earliest onset of cancer, an annual clinical breast exam, and perform monthly breast self-exams. Women in this family should also have a gynecological exam as recommended by their primary provider. All family members should have a colonoscopy by age 54.  FOLLOW-UP: Lastly, we discussed with Ms. Proffit that cancer genetics is a rapidly advancing field and it is possible that new genetic tests will be appropriate for her and/or her family members in the future. We encouraged her to remain in contact with cancer genetics on an annual basis so we can update her personal and family histories and let her know of advances in cancer genetics that may benefit this family.   Our contact number was provided. Ms. Curling questions were answered to her satisfaction, and she knows she is welcome to call us at anytime with additional questions or concerns.   Roma Kayser, MS, [facility] Certified Genetic Counselor [email or web address]

## 2017-10-22 ENCOUNTER — Encounter: Payer: Self-pay | Admitting: Hematology and Oncology

## 2017-10-24 ENCOUNTER — Encounter (HOSPITAL_COMMUNITY): Payer: Self-pay | Admitting: Hematology and Oncology

## 2017-10-25 ENCOUNTER — Encounter: Payer: Self-pay | Admitting: Hematology and Oncology

## 2017-10-28 NOTE — Telephone Encounter (Signed)
Chart review shows she has f/u Dr. Alvy Bimler and diarrhea sxs nearly gone to gone and gaining weight

## 2017-10-29 ENCOUNTER — Telehealth: Payer: Self-pay | Admitting: Hematology and Oncology

## 2017-10-29 NOTE — Telephone Encounter (Signed)
Scheduled appt per 4/24 sch - spoke with pt re appts that were added.

## 2017-10-31 ENCOUNTER — Telehealth: Payer: Self-pay | Admitting: Hematology and Oncology

## 2017-10-31 ENCOUNTER — Inpatient Hospital Stay: Payer: Medicare Other

## 2017-10-31 ENCOUNTER — Encounter: Payer: Self-pay | Admitting: Hematology and Oncology

## 2017-10-31 ENCOUNTER — Inpatient Hospital Stay: Payer: Medicare Other | Attending: Gynecologic Oncology

## 2017-10-31 ENCOUNTER — Inpatient Hospital Stay (HOSPITAL_BASED_OUTPATIENT_CLINIC_OR_DEPARTMENT_OTHER): Payer: Medicare Other | Admitting: Hematology and Oncology

## 2017-10-31 DIAGNOSIS — C562 Malignant neoplasm of left ovary: Secondary | ICD-10-CM | POA: Diagnosis not present

## 2017-10-31 DIAGNOSIS — C786 Secondary malignant neoplasm of retroperitoneum and peritoneum: Secondary | ICD-10-CM

## 2017-10-31 DIAGNOSIS — R64 Cachexia: Secondary | ICD-10-CM | POA: Insufficient documentation

## 2017-10-31 DIAGNOSIS — T451X5A Adverse effect of antineoplastic and immunosuppressive drugs, initial encounter: Secondary | ICD-10-CM

## 2017-10-31 DIAGNOSIS — C801 Malignant (primary) neoplasm, unspecified: Secondary | ICD-10-CM

## 2017-10-31 DIAGNOSIS — D72825 Bandemia: Secondary | ICD-10-CM | POA: Diagnosis not present

## 2017-10-31 DIAGNOSIS — Z5111 Encounter for antineoplastic chemotherapy: Secondary | ICD-10-CM | POA: Insufficient documentation

## 2017-10-31 DIAGNOSIS — D6481 Anemia due to antineoplastic chemotherapy: Secondary | ICD-10-CM

## 2017-10-31 DIAGNOSIS — N820 Vesicovaginal fistula: Secondary | ICD-10-CM | POA: Insufficient documentation

## 2017-10-31 DIAGNOSIS — C561 Malignant neoplasm of right ovary: Secondary | ICD-10-CM

## 2017-10-31 LAB — COMPREHENSIVE METABOLIC PANEL
ALBUMIN: 3.9 g/dL (ref 3.5–5.0)
ALK PHOS: 50 U/L (ref 40–150)
ALT: 13 U/L (ref 0–55)
ANION GAP: 10 (ref 3–11)
AST: 16 U/L (ref 5–34)
BILIRUBIN TOTAL: 0.4 mg/dL (ref 0.2–1.2)
BUN: 23 mg/dL (ref 7–26)
CALCIUM: 9.8 mg/dL (ref 8.4–10.4)
CO2: 25 mmol/L (ref 22–29)
Chloride: 105 mmol/L (ref 98–109)
Creatinine, Ser: 0.85 mg/dL (ref 0.60–1.10)
GFR calc Af Amer: >60 mL/min (ref >60)
GFR calc non Af Amer: >60 mL/min (ref >60)
GLUCOSE: 119 mg/dL (ref 70–140)
POTASSIUM: 4.1 mmol/L (ref 3.5–5.1)
SODIUM: 140 mmol/L (ref 136–145)
Total Protein: 7.1 g/dL (ref 6.4–8.3)

## 2017-10-31 LAB — CBC WITH DIFFERENTIAL/PLATELET
BASOS ABS: 0 10*3/uL (ref 0.0–0.1)
BASOS PCT: 1 %
EOS ABS: 0 10*3/uL (ref 0.0–0.5)
Eosinophils Relative: 0 %
HEMATOCRIT: 32.3 % — AB (ref 34.8–46.6)
HEMOGLOBIN: 11.1 g/dL — AB (ref 11.6–15.9)
Lymphocytes Relative: 3 %
Lymphs Abs: 0.3 10*3/uL — ABNORMAL LOW (ref 0.9–3.3)
MCH: 31.3 pg (ref 25.1–34.0)
MCHC: 34.5 g/dL (ref 31.5–36.0)
MCV: 90.6 fL (ref 79.5–101.0)
MONOS PCT: 1 %
Monocytes Absolute: 0 10*3/uL — ABNORMAL LOW (ref 0.1–0.9)
NEUTROS ABS: 8.8 10*3/uL — AB (ref 1.5–6.5)
NEUTROS PCT: 95 %
Platelets: 253 10*3/uL (ref 145–400)
RBC: 3.56 MIL/uL — ABNORMAL LOW (ref 3.70–5.45)
RDW: 15.5 % — ABNORMAL HIGH (ref 11.2–14.5)
WBC: 9.2 10*3/uL (ref 3.9–10.3)

## 2017-10-31 LAB — SAMPLE TO BLOOD BANK

## 2017-10-31 MED ORDER — FAMOTIDINE IN NACL 20-0.9 MG/50ML-% IV SOLN
INTRAVENOUS | Status: AC
  Filled 2017-10-31: qty 50

## 2017-10-31 MED ORDER — DIPHENHYDRAMINE HCL 50 MG/ML IJ SOLN
50.0000 mg | Freq: Once | INTRAMUSCULAR | Status: AC
  Administered 2017-10-31: 50 mg via INTRAVENOUS

## 2017-10-31 MED ORDER — SODIUM CHLORIDE 0.9 % IV SOLN
378.6000 mg | Freq: Once | INTRAVENOUS | Status: AC
  Administered 2017-10-31: 380 mg via INTRAVENOUS
  Filled 2017-10-31: qty 38

## 2017-10-31 MED ORDER — FAMOTIDINE IN NACL 20-0.9 MG/50ML-% IV SOLN
20.0000 mg | Freq: Once | INTRAVENOUS | Status: AC
  Administered 2017-10-31: 20 mg via INTRAVENOUS

## 2017-10-31 MED ORDER — SODIUM CHLORIDE 0.9 % IV SOLN
Freq: Once | INTRAVENOUS | Status: AC
  Administered 2017-10-31: 12:00:00 via INTRAVENOUS

## 2017-10-31 MED ORDER — PALONOSETRON HCL INJECTION 0.25 MG/5ML
0.2500 mg | Freq: Once | INTRAVENOUS | Status: AC
  Administered 2017-10-31: 0.25 mg via INTRAVENOUS

## 2017-10-31 MED ORDER — SODIUM CHLORIDE 0.9 % IV SOLN
131.2500 mg/m2 | Freq: Once | INTRAVENOUS | Status: AC
  Administered 2017-10-31: 192 mg via INTRAVENOUS
  Filled 2017-10-31: qty 32

## 2017-10-31 MED ORDER — FOSAPREPITANT DIMEGLUMINE INJECTION 150 MG
Freq: Once | INTRAVENOUS | Status: AC
  Administered 2017-10-31: 12:00:00 via INTRAVENOUS
  Filled 2017-10-31: qty 5

## 2017-10-31 MED ORDER — PALONOSETRON HCL INJECTION 0.25 MG/5ML
INTRAVENOUS | Status: AC
  Filled 2017-10-31: qty 5

## 2017-10-31 MED ORDER — SODIUM CHLORIDE 0.9% FLUSH
10.0000 mL | INTRAVENOUS | Status: DC | PRN
  Administered 2017-10-31: 10 mL
  Filled 2017-10-31: qty 10

## 2017-10-31 MED ORDER — DIPHENHYDRAMINE HCL 50 MG/ML IJ SOLN
INTRAMUSCULAR | Status: AC
  Filled 2017-10-31: qty 1

## 2017-10-31 MED ORDER — HEPARIN SOD (PORK) LOCK FLUSH 100 UNIT/ML IV SOLN
500.0000 [IU] | Freq: Once | INTRAVENOUS | Status: AC | PRN
  Administered 2017-10-31: 500 [IU]
  Filled 2017-10-31: qty 5

## 2017-10-31 NOTE — Assessment & Plan Note (Signed)
Her anemia is improving and iron studies are satisfactory I will monitor her blood counts closely.

## 2017-10-31 NOTE — Telephone Encounter (Signed)
Gave avs and calendar looking to schedule 5/22

## 2017-10-31 NOTE — Assessment & Plan Note (Signed)
She has completely recovered from recent infectious side effects We will proceed with cycle 5 of chemotherapy today She will complete final treatment at the end of the month Recent genetic testing was negative I will get her port removed after her last cycle of treatment and will refer her back to GYN oncologist for future follow-up

## 2017-10-31 NOTE — Progress Notes (Signed)
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   INTERVAL HISTORY: Please see below for problem oriented charting. She returns for cycle 5 of chemotherapy She is doing well since she completed treatment for C. difficile infection No recent fever or chills Appetite is stable Denies abdominal pain No recent nausea, vomiting or changes in bowel habits  SUMMARY OF ONCOLOGIC HISTORY: Oncology History   Neg genetics     Left ovarian epithelial cancer ([place])   04/24/2017 Initial Diagnosis    She reports a gradual sensation of pelvic heaviness which she thought might be related to genital prolapse.  She presented for evaluation of an ultrasound was performed which demonstrated a uterus measuring 6 x 5 cm with a 1.3 mm endometrial stripe.  Multiple heterogeneous pelvic masses were appreciated the right measured 8 x 5 cm and another measured 10 cm just  posterior to the uterus CA-125 was obtained and returned a value of 404.8. She also reports early satiety for approximately 2 months and a 10 pound weight loss over the last year.  She also reports mild abdominal bloating and sensation of pelvic heaviness      04/25/2017 Imaging    Ct abdomen and pelvis: 1. Appearance compatible with ovarian malignancy with widespread peritoneal, mesenteric, and omental metastatic disease. Specifically, a 12.7 cm primarily solid and partially cystic mass above the uterus probably arises from the right ovary and is associated with omental caking, complex ascites with peritoneal metastatic deposits, and mesenteric deposits of tumor. 2. Mild intrahepatic biliary dilatation. The extrahepatic biliary tree is within normal limits. 3.  Aortic Atherosclerosis (ICD10-I70.0).      05/01/2017 Procedure    Successful CT-guided left abdominal omental mass 18 gauge core biopsies      05/01/2017 Pathology Results    Soft Tissue Needle Core Biopsy, Left omental - METASTATIC CARCINOMA, SEE COMMENT. 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 The catheter is ready for immediate use.      05/16/2017 -  Chemotherapy    She received neoadjuvant chemo with carbo/taxol x 3 cycles followed by 3 more cycles of chemo      06/25/2017 Tumor Marker    Patient's tumor was tested for the following markers: CA-125 Results of the tumor marker test revealed 64.5      07/09/2017 Imaging    Decreased size of  cystic and solid central pelvic mass since previous study.  Near complete resolution of peritoneal metastatic disease and ascites since previous study.  No new or progressive metastatic disease identified. No evidence of metastatic disease within the thorax.  Large stool burden noted; recommend clinical  correlation for possible constipation.      07/24/2017 Surgery    Operation: Robotic-assisted laparoscopic total hysterectomy with bilateral salpingoophorectomy, omentectomy, radical tumor debulking  Surgeon: [name]  Operative Findings:  : 3cm left omental/splenic flexure tumor nodule adherent to transverse colon. 10cm pelvic tumor from left ovary, adherent to uterus and rectum, necrotic. Left retroperitoneal infiltration.  Tumor rind remaining on posterior cul de sac and distal rectum - very thin, <1cm thick, representing optimal cytoreductive procedure.         07/24/2017 Pathology Results    1. Omentum, resection for tumor - METASTATIC CARCINOMA. 2. Uterus +/- tubes/ovaries, neoplastic - LEFT OVARY: HIGH GRADE SEROUS CARCINOMA, SPANNING APPROXIMATELY 9.9 CM. TUMOR INVOLVES SURFACE. TREATMENT EFFECT PRESENT. SEE ONCOLOGY TABLE. - RIGHT OVARY: INVOLVED BY HIGH GRADE SEROUS CARCINOMA. - BILATERAL FALLOPIAN TUBES: INVOLVED BY HIGH GRADE SEROUS CARCINOMA. - UTERUS: -ENDOMETRIUM: ENDOMETRIAL TYPE POLYP. INACTIVE ENDOMETRIUM. NO HYPERPLASIA OR MALIGNANCY. -MYOMETRIUM: LEIOMYOMATA. NO MALIGNANCY. -SEROSA: INVOLVED BY HIGH GRADE SEROUS CARCINOMA. - CERVIX: HIGH GRADE SEROUS CARCINOMA PRESENT AT ANTERIOR CERVICAL SOFT TISSUE MARGIN. Microscopic Comment 2. OVARY Specimen(s): Uterus, cervix, bilateral ovaries and fallopian tubes, omentum. Procedure: (including lymph node sampling): Total hysterectomy with bilateral salpingo-oophorectomy. Omentectomy. Primary tumor site (including laterality): Left ovary. Ovarian surface involvement: Present. Ovarian capsule intact without fragmentation: Disrupted. Maximum tumor size (cm): Estimated 9.9 cm. Histologic type: High grade serous carcinoma. Grade: High grade (FIGO N/A) Peritoneal implants: (specify invasive or non-invasive): Present (>2 cm) as in part #1. Pelvic extension (list additional structures on separate lines and if  involved): Right ovary, bilateral fallopian tubes, cervical soft tissue. Lymph nodes: number examined 0 ; number positive 0 TNM code: ypT3c, ypNX, ypMX FIGO Stage (based on pathologic findings, needs clinical correlation): IIIC Comments: None.      07/24/2017 - 07/26/2017 Hospital Admission    She was admitted to the hospital for surgical debulking, complicated by anemia requiring blood transfusion.      07/29/2017 - 08/02/2017 Hospital Admission    She was admitted to the hospital for management of hematoma and bladder injury.      07/29/2017 Imaging    1. Blood products and large volume hemostatic material in the pelvis correlating with operative history. Sterility is indeterminate by imaging. No organized collection for drainage. 2. High-density posterior to the pelvic collection was present preoperatively and is attributed to mass calcification. No bowel leak is seen with oral contrast reaching the transverse colon. 3. Diffuse postoperative subcutaneous gas. Small hematomas at bilateral abdominal wall port sites. 4. Right nephrectomy.       07/31/2017 Procedure    Mild left hydronephrosis      08/01/2017 Imaging    Extravasation contrast from the posterior aspect of the bladder into the adjacent tissues      08/22/2017 Imaging    Persistent posterior left bladder wall leak near the midline, with contrast filling of a well-defined crescent-shaped extraluminal contrast collection posterior to the left bladder wall, and with vaginal fistulization demonstrated at the inferior margin of this collection      08/22/2017 Tumor Marker    Patient's tumor was tested for the following markers: CA-125 Results of the tumor marker test revealed 13.5      09/04/2017 Tumor  Marker    Patient's tumor was tested for the following markers: CA-125 Results of the tumor marker test revealed 12.5      09/25/2017 Tumor Marker    Patient's tumor was tested for the following markers: CA-125 Results of  the tumor marker test revealed 11.3      09/28/2017 Imaging    1. Status post hysterectomy. No evidence of recurrent or metastatic disease. 2. Hyperattenuation posterior to the urinary bladder is likely the site of previously described contrast extravasation. No well-defined fluid collection or fistulous communication to the vagina identified. 3. Aortic Atherosclerosis (ICD10-I70.0). 8 mm splenic artery aneurysm. 4. Right nephrectomy.       10/17/2017 Genetic Testing    ATM c.8428A>C (p.Lys2810Gln) and AXIN2 c.1235A>G (p.Asn412Ser) VUS identified on the [employer] Myrisk panel.  The [facility] gene panel offered by [employer] includes sequencing and deletion/duplication testing of the following 28 genes: APC, ATM, BARD1, BMPR1A, BRCA1, BRCA2, BRIP1, CHD1, CDK4, CDKN2A, CHEK2, EPCAM (large rearrangement only), MLH1, MSH2, MSH6, MUTYH, NBN, PALB2, PMS2, PTEN, RAD51C, RAD51D, SMAD4, STK11, and TP53. Sequencing was performed for select regions of POLE and POLD1, and large rearrangement analysis was performed for select regions of GREM1. The report date is October 15, 2017.  HRD testing was negative.  There were no BRCA mutations and the genomic instability was negative.  The report date is October 17, 2017.        REVIEW OF SYSTEMS:   Constitutional: Denies fevers, chills or abnormal weight loss Eyes: Denies blurriness of vision Ears, nose, mouth, throat, and face: Denies mucositis or sore throat Respiratory: Denies cough, dyspnea or wheezes Cardiovascular: Denies palpitation, chest discomfort or lower extremity swelling Gastrointestinal:  Denies nausea, heartburn or change in bowel habits Skin: Denies abnormal skin rashes Lymphatics: Denies new lymphadenopathy or easy bruising Neurological:Denies numbness, tingling or new weaknesses Behavioral/Psych: Mood is stable, no new changes  All other systems were reviewed with the patient and are negative.  I have reviewed the past medical  history, past surgical history, social history and family history with the patient and they are unchanged from previous note.  ALLERGIES:  has No Known Allergies.  MEDICATIONS:  Current Outpatient Medications  Medication Sig Dispense Refill  . acetaminophen (TYLENOL) 500 MG tablet Take 500 mg by mouth every 6 (six) hours as needed for mild pain or moderate pain.     . Calcium Carbonate-Vitamin D (CALCIUM + D PO) Take 1 tablet by mouth daily.     Marland Kitchen conjugated estrogens (PREMARIN) vaginal cream Place 1 Applicatorful vaginally at bedtime. 42.5 g 12  . dexamethasone (DECADRON) 4 MG tablet Take 5 tabs the evening before chemo and 5 tabs the morning of chemo, every 21 days by mouth with pills 30 tablet 1  . ferrous sulfate 300 (60 Fe) MG/5ML syrup Take 5 mLs (300 mg total) by mouth 2 (two) times daily with a meal. 150 mL 3  . lidocaine-prilocaine (EMLA) cream Apply to affected area once (Patient taking differently: Apply 1 application topically as needed. Apply to affected area once) 30 g 3  . loperamide (IMODIUM) 2 MG capsule Take 1 capsule (2 mg total) by mouth as needed for diarrhea or loose stools. 30 capsule 0  . mirabegron ER (MYRBETRIQ) 25 MG TB24 tablet Take 25 mg by mouth daily.    . ondansetron (ZOFRAN) 8 MG tablet Take 1 tablet (8 mg total) by mouth every 8 (eight) hours as needed for refractory nausea / vomiting. Start on day 3 after  chemo. 30 tablet 1  . oxyCODONE (OXY IR/ROXICODONE) 5 MG immediate release tablet Take 1 tablet (5 mg total) by mouth every 4 (four) hours as needed for severe pain. 60 tablet 0  . Pegfilgrastim (NEULASTA [place]) Inject 1 Dose into the skin once.    . prochlorperazine (COMPAZINE) 10 MG tablet Take 1 tablet (10 mg total) every 6 (six) hours as needed by mouth (Nausea or vomiting). 30 tablet 1  . saccharomyces boulardii (FLORASTOR) 250 MG capsule Take 1 capsule (250 mg total) by mouth 2 (two) times daily. 60 capsule 1   No current facility-administered medications  for this visit.     PHYSICAL EXAMINATION: ECOG PERFORMANCE STATUS: 1 - Symptomatic but completely ambulatory  Vitals:   10/31/17 1044  BP: 139/70  Pulse: 88  Resp: 16  Temp: 98 F (36.7 C)  SpO2: 100%   Filed Weights   10/31/17 1044  Weight: 95 lb (43.1 kg)    GENERAL:alert, no distress and comfortable SKIN: skin color, texture, turgor are normal, no rashes or significant lesions EYES: normal, Conjunctiva are pink and non-injected, sclera clear OROPHARYNX:no exudate, no erythema and lips, buccal mucosa, and tongue normal  NECK: supple, thyroid normal size, non-tender, without nodularity LYMPH:  no palpable lymphadenopathy in the cervical, axillary or inguinal LUNGS: clear to auscultation and percussion with normal breathing effort HEART: regular rate & rhythm and no murmurs and no lower extremity edema ABDOMEN:abdomen soft, non-tender and normal bowel sounds Musculoskeletal:no cyanosis of digits and no clubbing  NEURO: alert & oriented x 3 with fluent speech, no focal motor/sensory deficits  LABORATORY DATA:  I have reviewed the data as listed    Component Value Date/Time   NA 140 10/31/2017 0957   NA 140 06/25/2017 1003   K 4.1 10/31/2017 0957   K 4.6 06/25/2017 1003   CL 105 10/31/2017 0957   CO2 25 10/31/2017 0957   CO2 29 06/25/2017 1003   GLUCOSE 119 10/31/2017 0957   GLUCOSE 94 06/25/2017 1003   GLUCOSE 81 05/18/2006 1112   BUN 23 10/31/2017 0957   BUN 14.8 06/25/2017 1003   CREATININE 0.85 10/31/2017 0957   CREATININE 0.9 06/25/2017 1003   CALCIUM 9.8 10/31/2017 0957   CALCIUM 9.5 06/25/2017 1003   PROT 7.1 10/31/2017 0957   PROT 7.0 06/25/2017 1003   ALBUMIN 3.9 10/31/2017 0957   ALBUMIN 3.7 06/25/2017 1003   AST 16 10/31/2017 0957   AST 17 06/25/2017 1003   ALT 13 10/31/2017 0957   ALT 11 06/25/2017 1003   ALKPHOS 50 10/31/2017 0957   ALKPHOS 88 06/25/2017 1003   BILITOT 0.4 10/31/2017 0957   BILITOT 0.22 06/25/2017 1003   GFRNONAA >60  10/31/2017 0957   GFRAA >60 10/31/2017 0957    No results found for: SPEP, UPEP  Lab Results  Component Value Date   WBC 9.2 10/31/2017   NEUTROABS 8.8 (H) 10/31/2017   HGB 11.1 (L) 10/31/2017   HCT 32.3 (L) 10/31/2017   MCV 90.6 10/31/2017   PLT 253 10/31/2017      Chemistry      Component Value Date/Time   NA 140 10/31/2017 0957   NA 140 06/25/2017 1003   K 4.1 10/31/2017 0957   K 4.6 06/25/2017 1003   CL 105 10/31/2017 0957   CO2 25 10/31/2017 0957   CO2 29 06/25/2017 1003   BUN 23 10/31/2017 0957   BUN 14.8 06/25/2017 1003   CREATININE 0.85 10/31/2017 0957   CREATININE 0.9 06/25/2017 1003  Component Value Date/Time   CALCIUM 9.8 10/31/2017 0957   CALCIUM 9.5 06/25/2017 1003   ALKPHOS 50 10/31/2017 0957   ALKPHOS 88 06/25/2017 1003   AST 16 10/31/2017 0957   AST 17 06/25/2017 1003   ALT 13 10/31/2017 0957   ALT 11 06/25/2017 1003   BILITOT 0.4 10/31/2017 0957   BILITOT 0.22 06/25/2017 1003       All questions were answered. The patient knows to call the clinic with any problems, questions or concerns. No barriers to learning was detected.  I spent 15 minutes counseling the patient face to face. The total time spent in the appointment was 20 minutes and more than 50% was on counseling and review of test results  Heath Lark, MD 10/31/2017 11:37 AM

## 2017-10-31 NOTE — Telephone Encounter (Signed)
Called regarding 5/23 per Deatra Canter

## 2017-10-31 NOTE — Patient Instructions (Signed)
Implanted Port Home Guide An implanted port is a type of central line that is placed under the skin. Central lines are used to provide IV access when treatment or nutrition needs to be given through a person's veins. Implanted ports are used for long-term IV access. An implanted port may be placed because:  You need IV medicine that would be irritating to the small veins in your hands or arms.  You need long-term IV medicines, such as antibiotics.  You need IV nutrition for a long period.  You need frequent blood draws for lab tests.  You need dialysis.  Implanted ports are usually placed in the chest area, but they can also be placed in the upper arm, the abdomen, or the leg. An implanted port has two main parts:  Reservoir. The reservoir is round and will appear as a small, raised area under your skin. The reservoir is the part where a needle is inserted to give medicines or draw blood.  Catheter. The catheter is a thin, flexible tube that extends from the reservoir. The catheter is placed into a large vein. Medicine that is inserted into the reservoir goes into the catheter and then into the vein.  How will I care for my incision site? Do not get the incision site wet. Bathe or shower as directed by your health care provider. How is my port accessed? Special steps must be taken to access the port:  Before the port is accessed, a numbing cream can be placed on the skin. This helps numb the skin over the port site.  Your health care provider uses a sterile technique to access the port. ? Your health care provider must put on a mask and sterile gloves. ? The skin over your port is cleaned carefully with an antiseptic and allowed to dry. ? The port is gently pinched between sterile gloves, and a needle is inserted into the port.  Only "non-coring" port needles should be used to access the port. Once the port is accessed, a blood return should be checked. This helps ensure that the port  is in the vein and is not clogged.  If your port needs to remain accessed for a constant infusion, a clear (transparent) bandage will be placed over the needle site. The bandage and needle will need to be changed every week, or as directed by your health care provider.  Keep the bandage covering the needle clean and dry. Do not get it wet. Follow your health care provider's instructions on how to take a shower or bath while the port is accessed.  If your port does not need to stay accessed, no bandage is needed over the port.  What is flushing? Flushing helps keep the port from getting clogged. Follow your health care provider's instructions on how and when to flush the port. Ports are usually flushed with saline solution or a medicine called heparin. The need for flushing will depend on how the port is used.  If the port is used for intermittent medicines or blood draws, the port will need to be flushed: ? After medicines have been given. ? After blood has been drawn. ? As part of routine maintenance.  If a constant infusion is running, the port may not need to be flushed.  How long will my port stay implanted? The port can stay in for as long as your health care provider thinks it is needed. When it is time for the port to come out, surgery will be   done to remove it. The procedure is similar to the one performed when the port was put in. When should I seek immediate medical care? When you have an implanted port, you should seek immediate medical care if:  You notice a bad smell coming from the incision site.  You have swelling, redness, or drainage at the incision site.  You have more swelling or pain at the port site or the surrounding area.  You have a fever that is not controlled with medicine.  This information is not intended to replace advice given to you by your health care provider. Make sure you discuss any questions you have with your health care provider. Document  Released: 06/19/2005 Document Revised: 11/25/2015 Document Reviewed: 02/24/2013 Elsevier Interactive Patient Education  2017 [employer]  

## 2017-10-31 NOTE — Assessment & Plan Note (Signed)
Recent CT scan showed that majority of her postop complication has healed She has appointment to see urologist for possible Foley catheter removal I will defer to them for further management

## 2017-10-31 NOTE — Patient Instructions (Signed)
[facility] Discharge Instructions for Patients Receiving Chemotherapy  Today you received the following chemotherapy agents Taxol, carboplatin To help prevent nausea and vomiting after your treatment, we encourage you to take your nausea medication as directed If you develop nausea and vomiting that is not controlled by your nausea medication, call the clinic.   BELOW ARE SYMPTOMS THAT SHOULD BE REPORTED IMMEDIATELY:  *FEVER GREATER THAN 100.5 F  *CHILLS WITH OR WITHOUT FEVER  NAUSEA AND VOMITING THAT IS NOT CONTROLLED WITH YOUR NAUSEA MEDICATION  *UNUSUAL SHORTNESS OF BREATH  *UNUSUAL BRUISING OR BLEEDING  TENDERNESS IN MOUTH AND THROAT WITH OR WITHOUT PRESENCE OF ULCERS  *URINARY PROBLEMS  *BOWEL PROBLEMS  UNUSUAL RASH Items with * indicate a potential emergency and should be followed up as soon as possible.  Feel free to call the clinic should you have any questions or concerns. The clinic phone number is [phone].  Please show the CHEMO ALERT CARD at check-in to the Emergency Department and triage nurse.   

## 2017-10-31 NOTE — Assessment & Plan Note (Signed)
She is doing better, gaining weight and eating well I encouraged the patient to continue nutritional supplement as tolerated

## 2017-11-01 LAB — CA 125: CANCER ANTIGEN (CA) 125: 9.9 U/mL (ref 0.0–38.1)

## 2017-11-02 ENCOUNTER — Inpatient Hospital Stay: Payer: Medicare Other

## 2017-11-02 VITALS — BP 105/61 | HR 65 | Temp 98.1°F | Resp 18

## 2017-11-02 DIAGNOSIS — C801 Malignant (primary) neoplasm, unspecified: Secondary | ICD-10-CM

## 2017-11-02 DIAGNOSIS — Z5111 Encounter for antineoplastic chemotherapy: Secondary | ICD-10-CM | POA: Diagnosis not present

## 2017-11-02 DIAGNOSIS — C562 Malignant neoplasm of left ovary: Secondary | ICD-10-CM

## 2017-11-02 DIAGNOSIS — C786 Secondary malignant neoplasm of retroperitoneum and peritoneum: Secondary | ICD-10-CM

## 2017-11-02 MED ORDER — PEGFILGRASTIM INJECTION 6 MG/0.6ML ~~LOC~~
6.0000 mg | PREFILLED_SYRINGE | Freq: Once | SUBCUTANEOUS | Status: AC
  Administered 2017-11-02: 6 mg via SUBCUTANEOUS

## 2017-11-02 MED ORDER — PEGFILGRASTIM INJECTION 6 MG/0.6ML ~~LOC~~
PREFILLED_SYRINGE | SUBCUTANEOUS | Status: AC
  Filled 2017-11-02: qty 0.6

## 2017-11-02 NOTE — Patient Instructions (Signed)
Pegfilgrastim injection What is this medicine? PEGFILGRASTIM (PEG fil gra stim) is a long-acting granulocyte colony-stimulating factor that stimulates the growth of neutrophils, a type of white blood cell important in the body's fight against infection. It is used to reduce the incidence of fever and infection in patients with certain types of cancer who are receiving chemotherapy that affects the bone marrow, and to increase survival after being exposed to high doses of radiation. This medicine may be used for other purposes; ask your health care provider or pharmacist if you have questions. COMMON BRAND NAME(S): Neulasta What should I tell my health care provider before I take this medicine? They need to know if you have any of these conditions: -kidney disease -latex allergy -ongoing radiation therapy -sickle cell disease -skin reactions to acrylic adhesives (On-Body Injector only) -an unusual or allergic reaction to pegfilgrastim, filgrastim, other medicines, foods, dyes, or preservatives -pregnant or trying to get pregnant -breast-feeding How should I use this medicine? This medicine is for injection under the skin. If you get this medicine at home, you will be taught how to prepare and give the pre-filled syringe or how to use the On-body Injector. Refer to the patient Instructions for Use for detailed instructions. Use exactly as directed. Tell your healthcare provider immediately if you suspect that the On-body Injector may not have performed as intended or if you suspect the use of the On-body Injector resulted in a missed or partial dose. It is important that you put your used needles and syringes in a special sharps container. Do not put them in a trash can. If you do not have a sharps container, call your pharmacist or healthcare provider to get one. Talk to your pediatrician regarding the use of this medicine in children. While this drug may be prescribed for selected conditions,  precautions do apply. Overdosage: If you think you have taken too much of this medicine contact a poison control center or emergency room at once. NOTE: This medicine is only for you. Do not share this medicine with others. What if I miss a dose? It is important not to miss your dose. Call your doctor or health care professional if you miss your dose. If you miss a dose due to an On-body Injector failure or leakage, a new dose should be administered as soon as possible using a single prefilled syringe for manual use. What may interact with this medicine? Interactions have not been studied. Give your health care provider a list of all the medicines, herbs, non-prescription drugs, or dietary supplements you use. Also tell them if you smoke, drink alcohol, or use illegal drugs. Some items may interact with your medicine. This list may not describe all possible interactions. Give your health care provider a list of all the medicines, herbs, non-prescription drugs, or dietary supplements you use. Also tell them if you smoke, drink alcohol, or use illegal drugs. Some items may interact with your medicine. What should I watch for while using this medicine? You may need blood work done while you are taking this medicine. If you are going to need a MRI, CT scan, or other procedure, tell your doctor that you are using this medicine (On-Body Injector only). What side effects may I notice from receiving this medicine? Side effects that you should report to your doctor or health care professional as soon as possible: -allergic reactions like skin rash, itching or hives, swelling of the face, lips, or tongue -dizziness -fever -pain, redness, or irritation at site   where injected -pinpoint red spots on the skin -red or dark-brown urine -shortness of breath or breathing problems -stomach or side pain, or pain at the shoulder -swelling -tiredness -trouble passing urine or change in the amount of urine Side  effects that usually do not require medical attention (report to your doctor or health care professional if they continue or are bothersome): -bone pain -muscle pain This list may not describe all possible side effects. Call your doctor for medical advice about side effects. You may report side effects to FDA at 1-800-FDA-1088. Where should I keep my medicine? Keep out of the reach of children. Store pre-filled syringes in a refrigerator between 2 and 8 degrees C (36 and 46 degrees F). Do not freeze. Keep in carton to protect from light. Throw away this medicine if it is left out of the refrigerator for more than 48 hours. Throw away any unused medicine after the expiration date. NOTE: This sheet is a summary. It may not cover all possible information. If you have questions about this medicine, talk to your doctor, pharmacist, or health care provider.  2018 Elsevier/Gold Standard (2016-06-15 12:58:03)  

## 2017-11-05 ENCOUNTER — Telehealth: Payer: Self-pay | Admitting: Internal Medicine

## 2017-11-05 ENCOUNTER — Encounter: Payer: Self-pay | Admitting: Hematology and Oncology

## 2017-11-05 MED ORDER — VANCOMYCIN HCL 125 MG PO CAPS: 125.0000 mg | ORAL_CAPSULE | Freq: Four times a day (QID) | ORAL | 0 refills | Status: AC

## 2017-11-05 NOTE — Telephone Encounter (Signed)
Pt has been experiencing fever on and off since last Saturday and has been having diarrhea. She recently finished antibiotics for c-diff. Needs some advise.

## 2017-11-05 NOTE — Telephone Encounter (Signed)
Possibly this is C. Diff recurrence. Please restart vancomycin 125mg  PO, QID for 2 weeks.  No refills.  Thanks

## 2017-11-05 NOTE — Telephone Encounter (Signed)
Advised. Agrees to this plan. May also use Pedialyte for rehydration.

## 2017-11-05 NOTE — Telephone Encounter (Signed)
Dr Carlean Purl patient treated for C Diff recently. Finished her  Vancomycin "about a week and half to 2 weeks ago." Fevers from 99.0 to 101.0. She had her chemo for ovarian cancer on Wednesday. Dr Alvy Bimler has advised her to call us. Please advise as doctor of the day. Thank you.

## 2017-11-22 ENCOUNTER — Inpatient Hospital Stay: Payer: Medicare Other

## 2017-11-22 ENCOUNTER — Encounter: Payer: Self-pay | Admitting: Hematology and Oncology

## 2017-11-22 ENCOUNTER — Inpatient Hospital Stay (HOSPITAL_BASED_OUTPATIENT_CLINIC_OR_DEPARTMENT_OTHER): Payer: Medicare Other | Admitting: Hematology and Oncology

## 2017-11-22 VITALS — BP 128/72 | HR 92 | Temp 97.6°F | Resp 18 | Ht 64.0 in | Wt 93.5 lb

## 2017-11-22 DIAGNOSIS — D6481 Anemia due to antineoplastic chemotherapy: Secondary | ICD-10-CM

## 2017-11-22 DIAGNOSIS — C562 Malignant neoplasm of left ovary: Secondary | ICD-10-CM | POA: Diagnosis not present

## 2017-11-22 DIAGNOSIS — C561 Malignant neoplasm of right ovary: Secondary | ICD-10-CM

## 2017-11-22 DIAGNOSIS — R64 Cachexia: Secondary | ICD-10-CM

## 2017-11-22 DIAGNOSIS — C801 Malignant (primary) neoplasm, unspecified: Secondary | ICD-10-CM

## 2017-11-22 DIAGNOSIS — N820 Vesicovaginal fistula: Secondary | ICD-10-CM

## 2017-11-22 DIAGNOSIS — T451X5A Adverse effect of antineoplastic and immunosuppressive drugs, initial encounter: Secondary | ICD-10-CM

## 2017-11-22 DIAGNOSIS — D72825 Bandemia: Secondary | ICD-10-CM

## 2017-11-22 DIAGNOSIS — Z5111 Encounter for antineoplastic chemotherapy: Secondary | ICD-10-CM | POA: Diagnosis not present

## 2017-11-22 DIAGNOSIS — C786 Secondary malignant neoplasm of retroperitoneum and peritoneum: Secondary | ICD-10-CM

## 2017-11-22 LAB — SAMPLE TO BLOOD BANK

## 2017-11-22 LAB — COMPREHENSIVE METABOLIC PANEL
ALK PHOS: 80 U/L (ref 40–150)
ALT: 16 U/L (ref 0–55)
ANION GAP: 9 (ref 3–11)
AST: 17 U/L (ref 5–34)
Albumin: 4.2 g/dL (ref 3.5–5.0)
BUN: 23 mg/dL (ref 7–26)
CHLORIDE: 105 mmol/L (ref 98–109)
CO2: 27 mmol/L (ref 22–29)
Calcium: 9.8 mg/dL (ref 8.4–10.4)
Creatinine, Ser: 0.84 mg/dL (ref 0.60–1.10)
GFR calc non Af Amer: >60 mL/min (ref >60)
Glucose, Bld: 93 mg/dL (ref 70–140)
POTASSIUM: 4 mmol/L (ref 3.5–5.1)
SODIUM: 141 mmol/L (ref 136–145)
Total Bilirubin: 0.2 mg/dL (ref 0.2–1.2)
Total Protein: 7.5 g/dL (ref 6.4–8.3)

## 2017-11-22 LAB — CBC WITH DIFFERENTIAL/PLATELET
Basophils Absolute: 0.1 10*3/uL (ref 0.0–0.1)
Basophils Relative: 0 %
EOS ABS: 0 10*3/uL (ref 0.0–0.5)
EOS PCT: 0 %
HCT: 31.8 % — ABNORMAL LOW (ref 34.8–46.6)
Hemoglobin: 10.9 g/dL — ABNORMAL LOW (ref 11.6–15.9)
LYMPHS ABS: 0.5 10*3/uL — AB (ref 0.9–3.3)
Lymphocytes Relative: 3 %
MCH: 31.9 pg (ref 25.1–34.0)
MCHC: 34.3 g/dL (ref 31.5–36.0)
MCV: 93.1 fL (ref 79.5–101.0)
MONO ABS: 0.1 10*3/uL (ref 0.1–0.9)
Monocytes Relative: 1 %
Neutro Abs: 14.7 10*3/uL — ABNORMAL HIGH (ref 1.5–6.5)
Neutrophils Relative %: 96 %
Platelets: 304 10*3/uL (ref 145–400)
RBC: 3.41 MIL/uL — ABNORMAL LOW (ref 3.70–5.45)
RDW: 14.5 % (ref 11.2–14.5)
WBC: 15.4 10*3/uL — ABNORMAL HIGH (ref 3.9–10.3)

## 2017-11-22 MED ORDER — SODIUM CHLORIDE 0.9% FLUSH
10.0000 mL | Freq: Once | INTRAVENOUS | Status: AC
  Administered 2017-11-22: 10 mL
  Filled 2017-11-22: qty 10

## 2017-11-22 MED ORDER — HEPARIN SOD (PORK) LOCK FLUSH 100 UNIT/ML IV SOLN
500.0000 [IU] | Freq: Once | INTRAVENOUS | Status: AC
  Administered 2017-11-22: 500 [IU]
  Filled 2017-11-22: qty 5

## 2017-11-22 NOTE — Assessment & Plan Note (Signed)
She has recurrent leukocytosis Urine cultures pending Due to history of recurrent C. difficile, I will not prescribe empiric antibiotic until culture result is available

## 2017-11-22 NOTE — Progress Notes (Signed)
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 difficile, I will not prescribe empiric antibiotic until culture result is available  Anemia due to antineoplastic chemotherapy This is likely anemia of chronic disease. The patient denies recent history of bleeding such as epistaxis, hematuria or hematochezia. She is asymptomatic from the anemia. We will observe for now.     Orders Placed This Encounter  Procedures  . IR Removal Tun Access W/ Port W/O FL    Standing Status:   Future    Standing Expiration Date:   01/23/2019    Order Specific Question:   Reason for exam:    Answer:   no need chemo anymore    Order Specific Question:   Preferred Imaging  Location?    Answer:   [facility]    INTERVAL HISTORY: Please see below for problem oriented charting. She returns with family members for cycle 5 6 of chemotherapy Since the last time I saw her, right after chemotherapy, she had complained of severe diarrhea and have lost more weight She was treated with another course of oral vancomycin for possible recurrence C. 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  She presented for evaluation of an ultrasound was performed which demonstrated a uterus measuring 6 x 5 cm with a 1.3 mm endometrial stripe.  Multiple heterogeneous pelvic masses were appreciated the right measured 8 x 5 cm and another measured 10 cm just posterior to the uterus CA-125 was obtained and returned a value of 404.8. She also reports early satiety for approximately 2 months and  a 10 pound weight loss over the last year.  She also reports mild abdominal bloating and sensation of pelvic heaviness      04/25/2017 Imaging    Ct abdomen and pelvis: 1. Appearance compatible with ovarian malignancy with widespread peritoneal, mesenteric, and omental metastatic disease. Specifically, a 12.7 cm primarily solid and partially cystic mass above the uterus probably arises  from the right ovary and is associated with omental caking, complex ascites with peritoneal metastatic deposits, and mesenteric deposits of tumor. 2. Mild intrahepatic biliary dilatation. The extrahepatic biliary tree is within normal limits. 3.  Aortic Atherosclerosis (ICD10-I70.0).      05/01/2017 Procedure    Successful CT-guided left abdominal omental mass 18 gauge core biopsies      05/01/2017 Pathology Results    Soft Tissue Needle Core Biopsy, Left omental - METASTATIC CARCINOMA, SEE COMMENT. 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 She did not complete cycle 6 of chemo due to recurrent infection with C. difficile      05/15/2017 Procedure    Successful placement of a right internal jugular approach power injectable Port-A-Cath. The catheter is ready for immediate use.      06/25/2017 Tumor Marker    Patient's tumor was tested for the following markers: CA-125 Results of the tumor marker test revealed 64.5      07/09/2017 Imaging    Decreased size of cystic and solid central pelvic mass since previous study.  Near complete resolution of peritoneal metastatic disease and ascites since previous study.  No new or progressive metastatic disease identified. No evidence of metastatic disease within the thorax.  Large stool burden noted; recommend clinical correlation for possible constipation.      07/24/2017 Surgery    Operation: Robotic-assisted laparoscopic total hysterectomy with bilateral salpingoophorectomy, omentectomy, radical tumor debulking  Surgeon: [name]  Operative Findings:  : 3cm left omental/splenic flexure tumor nodule adherent to transverse colon. 10cm pelvic tumor from left ovary, adherent to uterus and rectum, necrotic. Left  retroperitoneal infiltration.  Tumor rind remaining on posterior cul de sac and distal rectum - very thin, <1cm thick, representing optimal cytoreductive procedure.         07/24/2017 Pathology Results    1. Omentum, resection for tumor - METASTATIC CARCINOMA. 2. Uterus +/- tubes/ovaries, neoplastic - LEFT OVARY: HIGH GRADE SEROUS CARCINOMA, SPANNING APPROXIMATELY 9.9 CM. TUMOR INVOLVES SURFACE. TREATMENT EFFECT PRESENT. SEE ONCOLOGY TABLE. - RIGHT OVARY: INVOLVED BY HIGH GRADE SEROUS CARCINOMA. - BILATERAL FALLOPIAN TUBES: INVOLVED BY HIGH GRADE SEROUS CARCINOMA. - UTERUS: -ENDOMETRIUM: ENDOMETRIAL TYPE POLYP. INACTIVE ENDOMETRIUM. NO HYPERPLASIA OR MALIGNANCY. -MYOMETRIUM: LEIOMYOMATA. NO MALIGNANCY. -SEROSA: INVOLVED BY HIGH GRADE SEROUS CARCINOMA. - CERVIX: HIGH GRADE SEROUS CARCINOMA PRESENT AT ANTERIOR CERVICAL SOFT TISSUE MARGIN. Microscopic Comment 2. OVARY Specimen(s): Uterus, cervix, bilateral ovaries and fallopian tubes, omentum. Procedure: (including lymph node sampling): Total hysterectomy with bilateral salpingo-oophorectomy. Omentectomy. Primary tumor site (including laterality): Left ovary. Ovarian surface involvement: Present. Ovarian capsule intact without fragmentation: Disrupted. Maximum tumor size (cm): Estimated 9.9 cm. Histologic type: High grade serous carcinoma. Grade: High grade (FIGO N/A) Peritoneal implants: (specify invasive or non-invasive): Present (>2 cm) as in part #1. Pelvic extension (list additional structures on separate lines and if involved): Right ovary, bilateral fallopian tubes, cervical soft tissue. Lymph nodes: number examined 0 ; number positive 0  TNM code: ypT3c, ypNX, ypMX FIGO Stage (based on pathologic findings, needs clinical correlation): IIIC Comments: None.      07/24/2017 - 07/26/2017 Hospital Admission    She was admitted to the hospital for surgical debulking, complicated by anemia requiring blood transfusion.       07/29/2017 - 08/02/2017 Hospital Admission    She was admitted to the hospital for management of hematoma and bladder injury.      07/29/2017 Imaging    1. Blood products and large volume hemostatic material in the pelvis correlating with operative history. Sterility is indeterminate by imaging. No organized collection for drainage. 2. High-density posterior to the pelvic collection was present preoperatively and is attributed to mass calcification. No bowel leak is seen with oral contrast reaching the transverse colon. 3. Diffuse postoperative subcutaneous gas. Small hematomas at bilateral abdominal wall port sites. 4. Right nephrectomy.       07/31/2017 Procedure    Mild left hydronephrosis      08/01/2017 Imaging    Extravasation contrast from the posterior aspect of the bladder into the adjacent tissues      08/22/2017 Imaging    Persistent posterior left bladder wall leak near the midline, with contrast filling of a well-defined crescent-shaped extraluminal contrast collection posterior to the left bladder wall, and with vaginal fistulization demonstrated at the inferior margin of this collection      08/22/2017 Tumor Marker    Patient's tumor was tested for the following markers: CA-125 Results of the tumor marker test revealed 13.5      09/04/2017 Tumor Marker    Patient's tumor was tested for the following markers: CA-125 Results of the tumor marker test revealed 12.5      09/25/2017 Tumor Marker    Patient's tumor was tested for the following markers: CA-125 Results of the tumor marker test revealed 11.3      09/28/2017 Imaging    1. Status post hysterectomy. No evidence of recurrent or metastatic disease. 2. Hyperattenuation posterior to the urinary bladder is likely the site of previously described contrast extravasation. No well-defined fluid collection or fistulous communication to the vagina identified. 3. Aortic Atherosclerosis (ICD10-I70.0). 8 mm splenic artery  aneurysm. 4. Right nephrectomy.       10/17/2017 Genetic Testing    ATM c.8428A>C (p.Lys2810Gln) and AXIN2 c.1235A>G (p.Asn412Ser) VUS identified on the [employer] Myrisk panel.  The [facility] gene panel offered by [employer] includes sequencing and deletion/duplication testing of the following 28 genes: APC, ATM, BARD1, BMPR1A, BRCA1, BRCA2, BRIP1, CHD1, CDK4, CDKN2A, CHEK2, EPCAM (large rearrangement only), MLH1, MSH2, MSH6, MUTYH, NBN, PALB2, PMS2, PTEN, RAD51C, RAD51D, SMAD4, STK11, and TP53. Sequencing was performed for select regions of POLE and POLD1, and large rearrangement analysis was performed for select regions of GREM1. The report date is October 15, 2017.  HRD testing was negative.  There were no BRCA mutations and the genomic instability was negative.  The report date is October 17, 2017.       10/31/2017 Tumor Marker    Patient's tumor was tested for the following markers: CA-125 Results of the tumor marker test revealed 9.9       REVIEW OF SYSTEMS:   Constitutional: Denies fevers, chills  Eyes: Denies blurriness of vision Ears, nose, mouth, throat, and face: Denies mucositis or sore throat Respiratory: Denies cough, dyspnea or wheezes Cardiovascular: Denies palpitation, chest discomfort or lower extremity swelling Skin: Denies abnormal skin rashes Lymphatics: Denies new lymphadenopathy or easy bruising Neurological:Denies numbness, tingling or  new weaknesses Behavioral/Psych: Mood is stable, no new changes  All other systems were reviewed with the patient and are negative.  I have reviewed the past medical history, past surgical history, social history and family history with the patient and they are unchanged from previous note.  ALLERGIES:  has No Known Allergies.  MEDICATIONS:  Current Outpatient Medications  Medication Sig Dispense Refill  . acetaminophen (TYLENOL) 500 MG tablet Take 500 mg by mouth every 6 (six) hours as needed for mild pain or  moderate pain.     . Calcium Carbonate-Vitamin D (CALCIUM + D PO) Take 1 tablet by mouth daily.     . ferrous sulfate 300 (60 Fe) MG/5ML syrup Take 5 mLs (300 mg total) by mouth 2 (two) times daily with a meal. 150 mL 3  . loperamide (IMODIUM) 2 MG capsule Take 1 capsule (2 mg total) by mouth as needed for diarrhea or loose stools. 30 capsule 0  . mirabegron ER (MYRBETRIQ) 25 MG TB24 tablet Take 25 mg by mouth daily.    Marland Kitchen oxyCODONE (OXY IR/ROXICODONE) 5 MG immediate release tablet Take 1 tablet (5 mg total) by mouth every 4 (four) hours as needed for severe pain. 60 tablet 0  . Pegfilgrastim (NEULASTA [place]) Inject 1 Dose into the skin once.    . saccharomyces boulardii (FLORASTOR) 250 MG capsule Take 1 capsule (250 mg total) by mouth 2 (two) times daily. 60 capsule 1   No current facility-administered medications for this visit.     PHYSICAL EXAMINATION: ECOG PERFORMANCE STATUS: 2 - Symptomatic, <50% confined to bed  Vitals:   11/22/17 1042  BP: 128/72  Pulse: 92  Resp: 18  Temp: 97.6 F (36.4 C)  SpO2: 100%   Filed Weights   11/22/17 1042  Weight: 93 lb 8 oz (42.4 kg)    GENERAL:alert, no distress and comfortable.  She looks thin and cachectic SKIN: skin color, texture, turgor are normal, no rashes or significant lesions  NEURO: alert & oriented x 3 with fluent speech, no focal motor/sensory deficits  LABORATORY DATA:  I have reviewed the data as listed    Component Value Date/Time   NA 141 11/22/2017 1020   NA 140 06/25/2017 1003   K 4.0 11/22/2017 1020   K 4.6 06/25/2017 1003   CL 105 11/22/2017 1020   CO2 27 11/22/2017 1020   CO2 29 06/25/2017 1003   GLUCOSE 93 11/22/2017 1020   GLUCOSE 94 06/25/2017 1003   GLUCOSE 81 05/18/2006 1112   BUN 23 11/22/2017 1020   BUN 14.8 06/25/2017 1003   CREATININE 0.84 11/22/2017 1020   CREATININE 0.9 06/25/2017 1003   CALCIUM 9.8 11/22/2017 1020   CALCIUM 9.5 06/25/2017 1003   PROT 7.5 11/22/2017 1020   PROT 7.0 06/25/2017  1003   ALBUMIN 4.2 11/22/2017 1020   ALBUMIN 3.7 06/25/2017 1003   AST 17 11/22/2017 1020   AST 17 06/25/2017 1003   ALT 16 11/22/2017 1020   ALT 11 06/25/2017 1003   ALKPHOS 80 11/22/2017 1020   ALKPHOS 88 06/25/2017 1003   BILITOT 0.2 11/22/2017 1020   BILITOT 0.22 06/25/2017 1003   GFRNONAA >60 11/22/2017 1020   GFRAA >60 11/22/2017 1020    No results found for: SPEP, UPEP  Lab Results  Component Value Date   WBC 15.4 (H) 11/22/2017   NEUTROABS 14.7 (H) 11/22/2017   HGB 10.9 (L) 11/22/2017   HCT 31.8 (L) 11/22/2017   MCV 93.1 11/22/2017   PLT 304 11/22/2017  Chemistry      Component Value Date/Time   NA 141 11/22/2017 1020   NA 140 06/25/2017 1003   K 4.0 11/22/2017 1020   K 4.6 06/25/2017 1003   CL 105 11/22/2017 1020   CO2 27 11/22/2017 1020   CO2 29 06/25/2017 1003   BUN 23 11/22/2017 1020   BUN 14.8 06/25/2017 1003   CREATININE 0.84 11/22/2017 1020   CREATININE 0.9 06/25/2017 1003      Component Value Date/Time   CALCIUM 9.8 11/22/2017 1020   CALCIUM 9.5 06/25/2017 1003   ALKPHOS 80 11/22/2017 1020   ALKPHOS 88 06/25/2017 1003   AST 17 11/22/2017 1020   AST 17 06/25/2017 1003   ALT 16 11/22/2017 1020   ALT 11 06/25/2017 1003   BILITOT 0.2 11/22/2017 1020   BILITOT 0.22 06/25/2017 1003      All questions were answered. The patient knows to call the clinic with any problems, questions or concerns. No barriers to learning was detected.  I spent 25 minutes counseling the patient face to face. The total time spent in the appointment was 30 minutes and more than 50% was on counseling and review of test results  Heath Lark, MD 11/22/2017 1:28 PM

## 2017-11-22 NOTE — Assessment & Plan Note (Signed)
She had recurrent infection again with last cycle of treatment and was treated with another course of antibiotic for possible C. difficile Overall, she has started to improve patient has started to gain some weight although she is weighing less than her previous visit I recommend frequent small meals as tolerated She agreed not to proceed with last cycle of treatment

## 2017-11-22 NOTE — Assessment & Plan Note (Signed)
This is likely anemia of chronic disease. The patient denies recent history of bleeding such as epistaxis, hematuria or hematochezia. She is asymptomatic from the anemia. We will observe for now.  

## 2017-11-22 NOTE — Assessment & Plan Note (Signed)
I have significant concern concerns about resumption of final cycle of treatment She had recurrent infection was C. difficile She had elevated white count today and does not feel well She has lost more weight I recommend omission of final treatment cycle #6 I will get her port removed and I plan to refer her back to GYN oncologist for future follow-up We discussed the plan of care and she agreed

## 2017-11-22 NOTE — Assessment & Plan Note (Signed)
She has persistent placement of Foley catheter due to history of possible fistula Per patient request, I have discussed this with her urologist today I will defer to them for further management

## 2017-11-23 ENCOUNTER — Telehealth: Payer: Self-pay | Admitting: *Deleted

## 2017-11-23 ENCOUNTER — Inpatient Hospital Stay: Payer: Medicare Other

## 2017-11-23 ENCOUNTER — Telehealth: Payer: Self-pay | Admitting: Internal Medicine

## 2017-11-23 LAB — CA 125: Cancer Antigen (CA) 125: 12.5 U/mL (ref 0.0–38.1)

## 2017-11-23 MED ORDER — VANCOMYCIN HCL 125 MG PO CAPS: 125.0000 mg | ORAL_CAPSULE | Freq: Four times a day (QID) | ORAL | 0 refills | Status: DC

## 2017-11-23 NOTE — Telephone Encounter (Signed)
Patient notified rx sent She will call back with an update.

## 2017-11-23 NOTE — Telephone Encounter (Signed)
Could be recurrent C diff  10 day Rx is typical but we can do 2 weeks vancomycin this time  125 mg qid  She should see improvement within 3-5 days though not necessarily resolution  Hydrate well. Gatorade, Oral rehydration recipes can be found by Googling [facility] oral rehydration solution  Have her give [place] an update Tuesday - sooner prn to on call   I am ccing Dr. Alvy Bimler

## 2017-11-23 NOTE — Telephone Encounter (Signed)
Patient reports that she is having mucus in her stool one loose stool today so far. She averages 2-3 stools a day.  She was not able to have chemo yesterday.  She has a foley catheter and they are waiting for a culture to return to determine tx.but no recent antibiotics.   She completed last round of vancomycin on Saturday or Sunday (see phone notes from 11/05/17). She only got a 10 day prescription.  Please advise

## 2017-11-23 NOTE — Telephone Encounter (Signed)
Called patient to notify her of her 3 month follow-up appointment with Dr. Denman George. Informed patient that her appointment is scheduled for February 01, 2018 at 3:00p.m.  I ask patient to please arrive at 2;45p.m.  Patient verbalized understanding.

## 2017-11-27 ENCOUNTER — Other Ambulatory Visit: Payer: Self-pay | Admitting: Hematology and Oncology

## 2017-11-28 ENCOUNTER — Telehealth: Payer: Self-pay | Admitting: Internal Medicine

## 2017-11-28 NOTE — Telephone Encounter (Signed)
Patient notified that she will need to continue Florastor for 1 month after completing antibiotics

## 2017-11-28 NOTE — Telephone Encounter (Signed)
Pt called to inform about her progress. She reported not to have fever and diarrhea anymore. However, she is not feeling great yet. She will continue with medication until she finishes treatment. She wants to know how long she needs to keep taking florastor.

## 2017-11-30 ENCOUNTER — Encounter: Payer: Self-pay | Admitting: Hematology and Oncology

## 2017-12-08 ENCOUNTER — Other Ambulatory Visit: Payer: Self-pay | Admitting: Radiology

## 2017-12-11 ENCOUNTER — Ambulatory Visit (HOSPITAL_COMMUNITY)
Admission: RE | Admit: 2017-12-11 | Discharge: 2017-12-11 | Disposition: A | Discharge status: Home / Self Care | Payer: Medicare Other | Source: Ambulatory Visit | Attending: Hematology and Oncology | Admitting: Hematology and Oncology

## 2017-12-11 ENCOUNTER — Encounter (HOSPITAL_COMMUNITY): Payer: Self-pay

## 2017-12-11 DIAGNOSIS — M810 Age-related osteoporosis without current pathological fracture: Secondary | ICD-10-CM | POA: Diagnosis not present

## 2017-12-11 DIAGNOSIS — Z8543 Personal history of malignant neoplasm of ovary: Secondary | ICD-10-CM | POA: Insufficient documentation

## 2017-12-11 DIAGNOSIS — Z452 Encounter for adjustment and management of vascular access device: Secondary | ICD-10-CM | POA: Diagnosis not present

## 2017-12-11 DIAGNOSIS — I491 Atrial premature depolarization: Secondary | ICD-10-CM | POA: Insufficient documentation

## 2017-12-11 DIAGNOSIS — F419 Anxiety disorder, unspecified: Secondary | ICD-10-CM | POA: Diagnosis not present

## 2017-12-11 DIAGNOSIS — C562 Malignant neoplasm of left ovary: Secondary | ICD-10-CM

## 2017-12-11 HISTORY — PX: IR REMOVAL TUN ACCESS W/ PORT W/O FL MOD SED: IMG2290

## 2017-12-11 LAB — CBC WITH DIFFERENTIAL/PLATELET
Basophils Absolute: 0 10*3/uL (ref 0.0–0.1)
Basophils Relative: 1 %
EOS ABS: 0.1 10*3/uL (ref 0.0–0.7)
EOS PCT: 2 %
HCT: 34.2 % — ABNORMAL LOW (ref 36.0–46.0)
Hemoglobin: 11.6 g/dL — ABNORMAL LOW (ref 12.0–15.0)
LYMPHS ABS: 1.1 10*3/uL (ref 0.7–4.0)
Lymphocytes Relative: 19 %
MCH: 32 pg (ref 26.0–34.0)
MCHC: 33.9 g/dL (ref 30.0–36.0)
MCV: 94.2 fL (ref 78.0–100.0)
MONO ABS: 0.3 10*3/uL (ref 0.1–1.0)
Monocytes Relative: 6 %
Neutro Abs: 4.3 10*3/uL (ref 1.7–7.7)
Neutrophils Relative %: 74 %
PLATELETS: 266 10*3/uL (ref 150–400)
RBC: 3.63 MIL/uL — AB (ref 3.87–5.11)
RDW: 13.5 % (ref 11.5–15.5)
WBC: 5.8 10*3/uL (ref 4.0–10.5)

## 2017-12-11 LAB — PROTIME-INR
INR: 0.94
Prothrombin Time: 12.5 seconds (ref 11.4–15.2)

## 2017-12-11 MED ORDER — LIDOCAINE HCL 1 % IJ SOLN
INTRAMUSCULAR | Status: AC
  Filled 2017-12-11: qty 20

## 2017-12-11 MED ORDER — SODIUM CHLORIDE 0.9 % IV SOLN
INTRAVENOUS | Status: DC
  Administered 2017-12-11: 12:00:00 via INTRAVENOUS

## 2017-12-11 MED ORDER — FENTANYL CITRATE (PF) 100 MCG/2ML IJ SOLN
INTRAMUSCULAR | Status: AC
  Filled 2017-12-11: qty 4

## 2017-12-11 MED ORDER — CEFAZOLIN SODIUM-DEXTROSE 1-4 GM/50ML-% IV SOLN
1.0000 g | INTRAVENOUS | Status: AC
  Administered 2017-12-11: 1 g via INTRAVENOUS
  Filled 2017-12-11: qty 50

## 2017-12-11 MED ORDER — MIDAZOLAM HCL 2 MG/2ML IJ SOLN
INTRAMUSCULAR | Status: AC
  Filled 2017-12-11: qty 4

## 2017-12-11 MED ORDER — CEFAZOLIN SODIUM-DEXTROSE 2-4 GM/100ML-% IV SOLN: 2.0000 g | INTRAVENOUS | Status: DC

## 2017-12-11 MED ORDER — FENTANYL CITRATE (PF) 100 MCG/2ML IJ SOLN
INTRAMUSCULAR | Status: AC | PRN
  Administered 2017-12-11: 50 ug via INTRAVENOUS

## 2017-12-11 MED ORDER — CEFAZOLIN SODIUM-DEXTROSE 2-4 GM/100ML-% IV SOLN: 2.0000 g | Freq: Once | INTRAVENOUS | Status: DC

## 2017-12-11 MED ORDER — LIDOCAINE HCL (PF) 1 % IJ SOLN
INTRAMUSCULAR | Status: AC | PRN
  Administered 2017-12-11: 10 mL

## 2017-12-11 MED ORDER — MIDAZOLAM HCL 2 MG/2ML IJ SOLN
INTRAMUSCULAR | Status: AC | PRN
  Administered 2017-12-11: 1 mg via INTRAVENOUS

## 2017-12-11 MED ORDER — CEFAZOLIN SODIUM-DEXTROSE 2-4 GM/100ML-% IV SOLN
INTRAVENOUS | Status: AC
  Filled 2017-12-11: qty 100

## 2017-12-11 NOTE — Procedures (Signed)
Interventional Radiology Procedure Note  Procedure: Removal of right IJ port.   Complications: None Recommendations:  - Ok to shower tomorrow - Do not submerge for 7 days - Routine wound care   Signed,  Christon Parada S. Stirling Orton, DO    

## 2017-12-11 NOTE — H&P (Signed)
Chief Complaint: Ovarian cancer  Referring Physician(s): Gorsuch,Ni  Supervising Physician: Corrie Mckusick  Patient Status: [facility] - Out-pt  History of Present Illness: Colleen Mckinney is a 72 y.o. 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  Dr. Alvy Bimler has significant concern about resumption of final cycle of treatment.  She had recurrent infection was C. Difficile.  She had episodes of elevated white count and continued to not feel well.  She continued to lose weight.  Dr.  Alvy Bimler recommend omission of final treatment cycle #6.  She is here today for [facility] removal.  She is NPO. No blood thinners.  Past Medical History:  Diagnosis Date  . Anxiety    claustrophobia, fear of heights  . Cancer ([place])    ovarian cancer   . Complication of anesthesia    remembers severe sore throat after kidney surgery and feeling dizzy  . 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 Left shoulder pain   . Osteoporosis   . Premature atrial complexes    see EKG in epic   . Skin lesion    "pre-cancer" -past hx.    Past Surgical History:  Procedure Laterality Date  . ABDOMINAL HYSTERECTOMY    . BREAST SURGERY Right    lumpectomy-benign  . CERVIX SURGERY     "freezing" dysplasia  . COLONOSCOPY    . DEBULKING N/A 07/24/2017   Procedure: RADICAL TUMOR DEBULKING;  Surgeon: Everitt Amber, MD;  Location: [facility];  Service: Gynecology;  Laterality: N/A;  . HYSTEROSCOPY    . IR FLUORO GUIDE PORT INSERTION RIGHT  05/15/2017  . IR US GUIDE VASC ACCESS RIGHT  05/15/2017  . NEPHRECTOMY  01/1998   right--donation  . OMENTECTOMY N/A 07/24/2017   Procedure: OMENTECTOMY;  Surgeon: Everitt Amber, MD;  Location: [facility];  Service: Gynecology;  Laterality: N/A;  . ROBOTIC ASSISTED TOTAL HYSTERECTOMY WITH BILATERAL SALPINGO OOPHERECTOMY N/A 07/24/2017   Procedure: XI ROBOTIC ASSISTED TOTAL HYSTERECTOMY WITH BILATERAL SALPINGO OOPHORECTOMY;  Surgeon: Everitt Amber, MD;  Location: [facility];  Service:  Gynecology;  Laterality: N/A;  . SHOULDER ARTHROSCOPY Left    "impingement" "frozen shoulder"  . SHOULDER ARTHROSCOPY WITH SUBACROMIAL DECOMPRESSION Right 10/23/2013   Procedure: RIGHT SHOULDER ARTHROSCOPY WITH SUBACROMIAL DECOMPRESSION, EVULATION UNDER ANESTHESIA  AND MANIPULATION UNDER ANESTHESIA;  Surgeon: Johnn Hai, MD;  Location: [facility];  Service: Orthopedics;  Laterality: Right;    Allergies: Patient has no known allergies.  Medications: Prior to Admission medications   Medication Sig Start Date End Date Taking? Authorizing Provider  acetaminophen (TYLENOL) 500 MG tablet Take 500 mg by mouth every 6 (six) hours as needed for mild pain or moderate pain.    Yes [provider]  Calcium Carbonate-Vitamin D (CALCIUM + D PO) Take 1 tablet by mouth daily.    Yes [provider]  saccharomyces boulardii (FLORASTOR) 250 MG capsule Take 1 capsule (250 mg total) by mouth 2 (two) times daily. 10/15/17  Yes Gatha Mayer, MD  ferrous sulfate 300 (60 Fe) MG/5ML syrup Take 5 mLs (300 mg total) by mouth 2 (two) times daily with a meal. 08/27/17   Everitt Amber, MD  loperamide (IMODIUM) 2 MG capsule Take 1 capsule (2 mg total) by mouth as needed for diarrhea or loose stools. 08/27/17   Everitt Amber, MD  mirabegron ER (MYRBETRIQ) 25 MG TB24 tablet Take 25 mg by mouth daily.    [provider]  oxyCODONE (OXY IR/ROXICODONE) 5 MG immediate release  tablet Take 1 tablet (5 mg total) by mouth every 4 (four) hours as needed for severe pain. 05/04/17   Heath Lark, MD  Pegfilgrastim (NEULASTA [place]) Inject 1 Dose into the skin once.    [provider]  vancomycin (VANCOCIN) 125 MG capsule Take 1 capsule (125 mg total) by mouth 4 (four) times daily. 11/23/17   Gatha Mayer, MD  prochlorperazine (COMPAZINE) 10 MG tablet Take 1 tablet (10 mg total) every 6 (six) hours as needed by mouth (Nausea or vomiting). 05/16/17 11/22/17  Heath Lark, MD     Family History  Problem  Relation Age of Onset  . Heart disease Mother        mitral valve prolapse  . Hypertension Mother   . Hyperlipidemia Mother   . Cancer Mother 28       breast  . Osteopenia Mother   . Heart disease Father        bypass  . Hypertension Father   . Hyperlipidemia Father   . Cancer Father 35       bladder,  prostate  . Melanoma Father 40  . Hyperlipidemia Sister   . Colon cancer Maternal Grandfather        dx 75-80  . Kidney cancer Maternal Aunt        dx in her 50s  . Stroke Paternal Grandfather     Social History   Socioeconomic History  . Marital status: Divorced    Spouse name: Not on file  . Number of children: 4  . Years of education: Not on file  . Highest education level: Not on file  Occupational History  . Occupation: retired-- Manufacturing engineer  Social Needs  . Financial resource strain: Not on file  . Food insecurity:    Worry: Not on file    Inability: Not on file  . Transportation needs:    Medical: Not on file    Non-medical: Not on file  Tobacco Use  . Smoking status: Never Smoker  . Smokeless tobacco: Never Used  Substance and Sexual Activity  . Alcohol use: Yes    Alcohol/week: 0.6 oz    Types: 1 Cans of beer per week    Comment: no alcohol comsumption in past month   . Drug use: No  . Sexual activity: Not Currently    Partners: Male  Lifestyle  . Physical activity:    Days per week: Not on file    Minutes per session: Not on file  . Stress: Not on file  Relationships  . Social connections:    Talks on phone: Not on file    Gets together: Not on file    Attends religious service: Not on file    Active member of club or organization: Not on file    Attends meetings of clubs or organizations: Not on file    Relationship status: Not on file  Other Topics Concern  . Not on file  Social History Narrative  . Not on file     Review of Systems: A 12 point ROS discussed and pertinent positives are indicated in the HPI above.  All other  systems are negative. Review of Systems  Vital Signs: BP 131/76 (BP Location: Right Arm)   Pulse 81   Temp 98.4 F (36.9 C) (Oral)   Resp 16   SpO2 98%   Physical Exam  Constitutional: She is oriented to person, place, and time. She appears well-developed.  HENT:  Head: Normocephalic and atraumatic.  Eyes: EOM are normal.  Neck: Normal range of motion.  Cardiovascular: Normal rate, regular rhythm and normal heart sounds.  Pulmonary/Chest: Effort normal and breath sounds normal.  Abdominal: Soft.  Musculoskeletal: Normal range of motion.  Neurological: She is alert and oriented to person, place, and time.  Skin: Skin is warm and dry.  Psychiatric: She has a normal mood and affect. Her behavior is normal. Judgment and thought content normal.  Vitals reviewed.   Imaging: No results found.  Labs:  CBC: Recent Labs    09/25/17 0858 10/31/17 0957 11/22/17 1020 12/11/17 1135  WBC 24.8* 9.2 15.4* 5.8  HGB 10.8* 11.1* 10.9* 11.6*  HCT 31.6* 32.3* 31.8* 34.2*  PLT 340 253 304 266    COAGS: Recent Labs    05/15/17 1026 07/24/17 1858 07/29/17 1259 12/11/17 1135  INR 1.04 1.20 1.07 0.94  APTT  --  41*  --   --     BMP: Recent Labs    09/04/17 0738 09/25/17 0858 10/31/17 0957 11/22/17 1020  NA 139 138 140 141  K 3.9 4.3 4.1 4.0  CL 102 104 105 105  CO2 26 26 25 27   GLUCOSE 197* 177* 119 93  BUN 17 21 23 23   CALCIUM 9.7 9.4 9.8 9.8  CREATININE 0.91 0.83 0.85 0.84  GFRNONAA >60 >60 >60 >60  GFRAA >60 >60 >60 >60    LIVER FUNCTION TESTS: Recent Labs    09/04/17 0738 09/25/17 0858 10/31/17 0957 11/22/17 1020  BILITOT 0.2 <0.2* 0.4 0.2  AST 12 11 16 17   ALT 12 9 13 16   ALKPHOS 55 77 50 80  PROT 7.3 7.1 7.1 7.5  ALBUMIN 3.7 3.3* 3.9 4.2    TUMOR MARKERS: No results for input(s): AFPTM, CEA, CA199, CHROMGRNA in the last 8760 hours.  Assessment and Plan:  Ovarian cancer = completion of chemotherapy.  Will proceed with removal of Port A Cath  today by Dr. Earleen Newport.  Risks and benefits of image guided port-a-catheter placement was discussed with the patient including, but not limited to bleeding, infection, pneumothorax, or fibrin sheath development and need for additional procedures.  All of the patient's questions were answered, patient is agreeable to proceed. Consent signed and in chart.  Thank you for this interesting consult.  I greatly enjoyed meeting RINOA GARRAMONE and look forward to participating in their care.  A copy of this report was sent to the requesting provider on this date.  Electronically Signed: Murrell Redden, PA-C   12/11/2017, 12:39 PM      I spent a total of    25 Minutes in face to face in clinical consultation, greater than 50% of which was counseling/coordinating care for [facility] removal.

## 2017-12-11 NOTE — Discharge Instructions (Signed)
Implanted Port Removal, Care After °Refer to this sheet in the next few weeks. These instructions provide you with information about caring for yourself after your procedure. Your health care provider may also give you more specific instructions. Your treatment has been planned according to current medical practices, but problems sometimes occur. Call your health care provider if you have any problems or questions after your procedure. °What can I expect after the procedure? °After the procedure, it is common to have: °· Soreness or pain near your incision. °· Some swelling or bruising near your incision. ° °Follow these instructions at home: °Medicines °· Take over-the-counter and prescription medicines only as told by your health care provider. °· If you were prescribed an antibiotic medicine, take it as told by your health care provider. Do not stop taking the antibiotic even if you start to feel better. °Bathing °· Do not take baths, swim, or use a hot tub until your health care provider approves. Ask your health care provider if you can take showers. You may only be allowed to take sponge baths for bathing. °Incision care °· Follow instructions from your health care provider about how to take care of your incision. Make sure you: °? Wash your hands with soap and water before you change your bandage (dressing). If soap and water are not available, use hand sanitizer. °? Change your dressing as told by your health care provider. °? Keep your dressing dry. °? Leave stitches (sutures), skin glue, or adhesive strips in place. These skin closures may need to stay in place for 2 weeks or longer. If adhesive strip edges start to loosen and curl up, you may trim the loose edges. Do not remove adhesive strips completely unless your health care provider tells you to do that. °· Check your incision area every day for signs of infection. Check for: °? More redness, swelling, or pain. °? More fluid or  blood. °? Warmth. °? Pus or a bad smell. °Driving °· If you received a sedative, do not drive for 24 hours after the procedure. °· If you did not receive a sedative, ask your health care provider when it is safe to drive. °Activity °· Return to your normal activities as told by your health care provider. Ask your health care provider what activities are safe for you. °· Until your health care provider says it is safe: °? Do not lift anything that is heavier than 10 lb (4.5 kg). °? Do not do activities that involve lifting your arms over your head. °General instructions °· Do not use any tobacco products, such as cigarettes, chewing tobacco, and e-cigarettes. Tobacco can delay healing. If you need help quitting, ask your health care provider. °· Keep all follow-up visits as told by your health care provider. This is important. °Contact a health care provider if: °· You have more redness, swelling, or pain around your incision. °· You have more fluid or blood coming from your incision. °· Your incision feels warm to the touch. °· You have pus or a bad smell coming from your incision. °· You have a fever. °· You have pain that is not relieved by your pain medicine. °Get help right away if: °· You have chest pain. °· You have difficulty breathing. °This information is not intended to replace advice given to you by your health care provider. Make sure you discuss any questions you have with your health care provider. °Document Released: 05/31/2015 Document Revised: 11/25/2015 Document Reviewed: 03/24/2015 °Elsevier Interactive Patient   Education © 2018 [employer] Inc[employer] °Moderate Conscious Sedation, Adult, Care After °These instructions provide you with information about caring for yourself after your procedure. Your health care provider may also give you more specific instructions. Your treatment has been planned according to current medical practices, but problems sometimes occur. Call your health care provider if you have  any problems or questions after your procedure. °What can I expect after the procedure? °After your procedure, it is common: °· To feel sleepy for several hours. °· To feel clumsy and have poor balance for several hours. °· To have poor judgment for several hours. °· To vomit if you eat too soon. ° °Follow these instructions at home: °For at least 24 hours after the procedure: ° °· Do not: °? Participate in activities where you could fall or become injured. °? Drive. °? Use heavy machinery. °? Drink alcohol. °? Take sleeping pills or medicines that cause drowsiness. °? Make important decisions or sign legal documents. °? Take care of children on your own. °· Rest. °Eating and drinking °· Follow the diet recommended by your health care provider. °· If you vomit: °? Drink water, juice, or soup when you can drink without vomiting. °? Make sure you have little or no nausea before eating solid foods. °General instructions °· Have a responsible adult stay with you until you are awake and alert. °· Take over-the-counter and prescription medicines only as told by your health care provider. °· If you smoke, do not smoke without supervision. °· Keep all follow-up visits as told by your health care provider. This is important. °Contact a health care provider if: °· You keep feeling nauseous or you keep vomiting. °· You feel light-headed. °· You develop a rash. °· You have a fever. °Get help right away if: °· You have trouble breathing. °This information is not intended to replace advice given to you by your health care provider. Make sure you discuss any questions you have with your health care provider. °Document Released: 04/09/2013 Document Revised: 11/22/2015 Document Reviewed: 10/09/2015 °Elsevier Interactive Patient Education © 2018 Elsevier Inc. ° °

## 2017-12-12 ENCOUNTER — Encounter (HOSPITAL_COMMUNITY): Payer: Self-pay | Admitting: Interventional Radiology

## 2017-12-12 ENCOUNTER — Other Ambulatory Visit: Payer: Self-pay | Admitting: Hematology and Oncology

## 2017-12-13 ENCOUNTER — Telehealth: Payer: Self-pay | Admitting: Internal Medicine

## 2017-12-13 NOTE — Telephone Encounter (Signed)
Pt reports fever of 102.7 at 2 pm today took tylenol and temp is down to 100.7.  Has no diarrhea but feels "really bad"  Finished vanc for C diff last Friday and Tuesday 6/11  Had IV ancef for port removal.  No abd pain just fever and "bad feeling"  Please advise

## 2017-12-14 ENCOUNTER — Encounter: Payer: Self-pay | Admitting: Internal Medicine

## 2017-12-14 MED ORDER — VANCOMYCIN HCL 125 MG PO CAPS: 125.0000 mg | ORAL_CAPSULE | Freq: Four times a day (QID) | ORAL | 0 refills | Status: DC

## 2017-12-14 NOTE — Telephone Encounter (Signed)
Dr Carlean Purl the pt returned call and states she did have diarrhea overnight.  Please advise

## 2017-12-14 NOTE — Telephone Encounter (Signed)
I spoke to her - this is 3rd (she thinks 4th) C diff episode  Needs to add Dificid - will need to Rx and go through [facility]  200 mg po bid x 10 days  Please use [facility] to get this for her

## 2017-12-14 NOTE — Telephone Encounter (Signed)
Sounds like C diff again  Need to restart vancomycin 125 mg qid x 14 days Rx please  I will also call her later as I think we need to do some different tx   I am ccing Dr. Alvy Bimler also

## 2017-12-14 NOTE — Telephone Encounter (Signed)
Patient states she had diarrhea all night and wants to know if medication can be sent in.

## 2017-12-14 NOTE — Telephone Encounter (Signed)
Colleen Mckinney this is one I started for you yesterday

## 2017-12-14 NOTE — Telephone Encounter (Signed)
Patient notified rx sent 

## 2017-12-14 NOTE — Telephone Encounter (Signed)
[facility]erral [facility] to [facility]

## 2017-12-14 NOTE — Telephone Encounter (Signed)
Contact her again  If only febrile and no diarrhea needs to see PCP or oncologist  Let me know if diarrhea

## 2017-12-17 NOTE — Telephone Encounter (Signed)
Patient notified that Dificid has been approved according to [facility].  She will let me know if she can afford it when they call her back.

## 2017-12-19 NOTE — Telephone Encounter (Signed)
She is better and taking Dificid and vancomycin  We need to contact her and schedule her for July 9 in a banding slot please - if she can make that if not find another spot that week  Explain to her that I will hold off on Zinplava -- the monoclonal Ab - need to see if using Dificd prevents relapse this time first  If she gets it again (hope not) will then consider using Zinplava or fecal transplant  I am willing to Rx medication for her to have on hand when she goes to [employer] 7/23 - we can discuss that at f/u

## 2017-12-20 NOTE — Telephone Encounter (Signed)
Patient notified  She will come in 01/08/18 3:00

## 2018-01-04 ENCOUNTER — Other Ambulatory Visit: Payer: Self-pay

## 2018-01-04 ENCOUNTER — Telehealth: Payer: Self-pay | Admitting: Internal Medicine

## 2018-01-04 DIAGNOSIS — R197 Diarrhea, unspecified: Secondary | ICD-10-CM

## 2018-01-04 MED ORDER — VANCOMYCIN HCL 125 MG PO CAPS: 125.0000 mg | ORAL_CAPSULE | Freq: Four times a day (QID) | ORAL | 0 refills | Status: DC

## 2018-01-04 NOTE — Telephone Encounter (Signed)
Spoke with patient.  She thinks she has c.diff again.  "Flakey stool with mucus" and she also complains of a fever.  Requesting vancomycin.  Please advise.   Thank you.

## 2018-01-04 NOTE — Telephone Encounter (Signed)
She is going to try to get specimen collection kit today.  Prescription sent to her pharmacy.  Asked her not to start ATB before getting specimen.

## 2018-01-04 NOTE — Telephone Encounter (Signed)
Rx vancomycin 125 mg qid # 56  Ask her to collect a stool for C diff also

## 2018-01-07 ENCOUNTER — Other Ambulatory Visit: Payer: Medicare Other

## 2018-01-07 DIAGNOSIS — R197 Diarrhea, unspecified: Secondary | ICD-10-CM

## 2018-01-08 ENCOUNTER — Encounter: Payer: Self-pay | Admitting: Internal Medicine

## 2018-01-08 ENCOUNTER — Ambulatory Visit (INDEPENDENT_AMBULATORY_CARE_PROVIDER_SITE_OTHER): Payer: Medicare Other | Admitting: Internal Medicine

## 2018-01-08 VITALS — BP 116/74 | HR 84 | Temp 99.2°F | Ht 63.25 in | Wt 92.2 lb

## 2018-01-08 DIAGNOSIS — C562 Malignant neoplasm of left ovary: Secondary | ICD-10-CM | POA: Diagnosis not present

## 2018-01-08 DIAGNOSIS — A0471 Enterocolitis due to Clostridium difficile, recurrent: Secondary | ICD-10-CM | POA: Diagnosis not present

## 2018-01-08 LAB — CLOSTRIDIUM DIFFICILE BY PCR: CDIFFPCR: POSITIVE — AB

## 2018-01-08 MED ORDER — VANCOMYCIN HCL 125 MG PO CAPS: ORAL_CAPSULE | ORAL | 1 refills | Status: DC

## 2018-01-08 NOTE — Progress Notes (Signed)
Colleen Mckinney 72 y.o. Apr 09, 1946 [id number]  Assessment & Plan:   Encounter Diagnoses  Name Primary?  . Recurrent colitis due to Clostridium difficile Yes  . Left ovarian epithelial cancer ([place])     I think she is a candidate for fecal microbiota transplant and I reviewed that with her today.  I would use open biome specimen.  Another option would be the monoclonal antibody.  I favor the former however.  We need to confirm she still has C. difficile as I suspect she does.  She will continue vancomycin 125 mg 4 times daily for 2 weeks total and then we will try a long taper and see about a possible fecal transplant.  [place] beach [place] and want to keep her well for that.  I will check with Dr. Alvy Bimler and Denman George regarding this and also get in touch with Dr. Carlyle Basques of ID.  I appreciate the opportunity to care for you. YJ:[id number], Fransico Him, MD Dr. Heath Lark Dr. Carlyle Basques Dr. Everitt Amber Subjective:   Chief Complaint: Recurrent C. difficile  HPI The patient is here, she has had C. difficile colitis 3 times since April.  She was undergoing chemotherapy for ovarian cancer when this occurred.  She is now on her fourth round of vancomycin for suspected recurrent C. difficile, the last treatment in June I did treat her with Dificid as well.  She has taken 125 mg 4 times daily vancomycin 4 times a day for 2 weeks on all occasions.  A few days ago she developed fever and then loose watery diarrhea again.  She turned in a C. difficile specimen yesterday is pending she has been on vancomycin since yesterday.  She is feeling a little better.  Mild cramps.  No bleeding.  Every time she gets this she loses some weight and has a hard time regaining.  At this point she is done with chemotherapy for the time being.  Last colonoscopy was 2015 flat hyperplastic polyp No Known Allergies Current Meds  Medication Sig  . acetaminophen (TYLENOL) 500 MG tablet Take 500 mg by  mouth every 6 (six) hours as needed for mild pain or moderate pain.   . Calcium Carbonate-Vitamin D (CALCIUM + D PO) Take 1 tablet by mouth daily.   Marland Kitchen saccharomyces boulardii (FLORASTOR) 250 MG capsule Take 1 capsule (250 mg total) by mouth 2 (two) times daily.  . vancomycin (VANCOCIN HCL) 125 MG capsule Take as directed by Dr. Carlean Purl after 2 weeks of qid  . [ vancomycin (VANCOCIN HCL) 125 MG capsule Take 1 capsule (125 mg total) by mouth 4 (four) times daily.   Past Medical History:  Diagnosis Date  . Anxiety    claustrophobia, fear of heights  . Complication of anesthesia    remembers severe sore throat after kidney surgery and feeling dizzy  . 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 Left shoulder pain   . Osteoporosis   . Ovarian cancer ([place])    ovarian cancer   . Premature atrial complexes    see EKG in epic   . Recurrent colitis due to Clostridium difficile 2019  . Skin lesion    "pre-cancer" -past hx.   Past Surgical History:  Procedure Laterality Date  . ABDOMINAL HYSTERECTOMY    . BREAST SURGERY Right    lumpectomy-benign  . CERVIX SURGERY     "freezing" dysplasia  . COLONOSCOPY    .  DEBULKING N/A 07/24/2017   Procedure: RADICAL TUMOR DEBULKING;  Surgeon: Everitt Amber, MD;  Location: [facility];  Service: Gynecology;  Laterality: N/A;  . HYSTEROSCOPY    . IR FLUORO GUIDE PORT INSERTION RIGHT  05/15/2017  . IR REMOVAL TUN ACCESS W/ PORT W/O FL MOD SED  12/11/2017  . IR US GUIDE VASC ACCESS RIGHT  05/15/2017  . NEPHRECTOMY  01/1998   right--donation  . OMENTECTOMY N/A 07/24/2017   Procedure: OMENTECTOMY;  Surgeon: Everitt Amber, MD;  Location: [facility];  Service: Gynecology;  Laterality: N/A;  . ROBOTIC ASSISTED TOTAL HYSTERECTOMY WITH BILATERAL SALPINGO OOPHERECTOMY N/A 07/24/2017   Procedure: XI ROBOTIC ASSISTED TOTAL HYSTERECTOMY WITH BILATERAL SALPINGO OOPHORECTOMY;  Surgeon: Everitt Amber, MD;  Location: [facility];  Service: Gynecology;  Laterality: N/A;  . SHOULDER  ARTHROSCOPY Left    "impingement" "frozen shoulder"  . 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   Review of Systems Loses weight every infection Good spirits overall + fatigue   Objective:   Physical Exam BP 116/74 (BP Location: Left Arm, Patient Position: Sitting, Cuff Size: Normal)   Pulse 84   Temp 99.2 F (37.3 C)   Ht 5' 3.25" (1.607 m) Comment: height measured without shoes  Wt 92 lb 4 oz (41.8 kg)   BMI 16.21 kg/m  NAD Abd soft NT Thin,  25 minutes time spent with patient > half in counseling coordination of care

## 2018-01-08 NOTE — Assessment & Plan Note (Signed)
Headed for [place] coming up  Stay on on vancpo  Tid x 1 week then bid x 1 week then qd then?

## 2018-01-08 NOTE — Patient Instructions (Addendum)
We are giving you a printed Vancomycin rx to fill when you use up the rx you have on hand. Take your Vancomycin as follows:  One capsule 4 times a day for 2 weeks, then one capsule 3 times a day for 1 week, then 1 capsule 2 times a day for a week then one capsule daily until otherwise instructed.   We will call you with your C-Diff results once they come in.    I appreciate the opportunity to care for you. Silvano Rusk, MD, [facility]

## 2018-01-10 ENCOUNTER — Encounter: Payer: Self-pay | Admitting: Internal Medicine

## 2018-01-10 NOTE — Progress Notes (Signed)
C diff is + as expected My Chart message

## 2018-01-11 NOTE — Progress Notes (Signed)
I sent her a My Chart message Please also call her and get sx updat C diff - fever? Pain diarrhea?

## 2018-01-14 ENCOUNTER — Encounter: Payer: Self-pay | Admitting: Internal Medicine

## 2018-01-31 ENCOUNTER — Telehealth: Payer: Self-pay | Admitting: *Deleted

## 2018-01-31 NOTE — Telephone Encounter (Signed)
Called and moved the patient's appt from 3pm to 12:30pm.

## 2018-02-01 ENCOUNTER — Inpatient Hospital Stay: Payer: Medicare Other | Admitting: Gynecologic Oncology

## 2018-02-06 NOTE — Progress Notes (Signed)
Follow-up Note: Gyn-Onc  Consult was requested by Dr.Fogleman for the evaluation of Colleen Mckinney 72 y.o. female  CC:  Chief Complaint  Patient presents with  . Malignant neoplasm of ovary, unspecified laterality [facility])    Assessment/Plan:  Colleen Mckinney is a 72 y.o. with evidence history of stage IIIC ovarian cancer s/p neoadjuvant chemotherapy with good response after 3 cycles s/p interval debulking on 07/24/17 and 2 cycles adjuvant chemotherapy completed 10/31/17. 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 BRCA negative Complete clinical response. Adjuvant therapy abreviated due to recurrent c diff infections.  Hx of untreated UTI - will perform test of cure culture.   Discussed the symptoms of recurrence of ovarian cancer and our recommendation for 3 monthly evaluations.    I will see her back in 29month.   HPI: Colleen Mckinney a 72y.o. gravida 5 para 4 who reports a gradual sensation of pelvic heaviness which she thought might be related to genital prolapse.  She presented for evaluation of an ultrasound was performed which demonstrated a uterus measuring 6 x 5 cm with a 1.3 mm endometrial stripe.  Multiple heterogeneous pelvic masses were appreciated the right measured 8 x 5 cm and another measured 10 cm just posterior to the uterus CA-125 was obtained and returned a value of 404.8.  CT scan was obtained on April 25, 2017 and is notable for a capsular mass along the dome of the right hepatic lobe measuring 2.5 x 2.3 x 1.4 cm.  There are several tiny hypodense lesion lateral segment of the left hepatic lobe probably benign cysts.  The right kidney is surgically absent dorsal to the uterus there is a 12.7 x 7.1 x 7.3 cm primarily solid mass with cystic elements the mass is intimately associated with the uterus but also abuts the rectosigmoid extensive and omental caking was appreciated most notably in the left abdomen measuring up to 2.9 cm in  thickness there was scattered tumor in the mesentery in the right colon mesentery was noted to have a 2.9 x 2.2 cm mesenteric mass.  The appearance is compatible with widespread peritoneal and omental metastatic disease.  Ms. gentle reports early satiety for approximately 2 months and a 10 pound weight loss over the last year.  She also reports mild abdominal bloating and sensation of pelvic heaviness.  Her family history is notable for mother diagnosed with breast cancer at the age of 81and maternal grandfather with colon cancer and a father who was diagnosed with bladder cancer in his 870sand prostate cancer in the 719s  She underwent a colonoscopy approximately 3 years ago and is up-to-date on mammography.  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  On May 16, 2017 until June 25, 2017 she received 3 cycles of neoadjuvant carboplatin paclitaxel without issue.  Overall she feels well.  Her ascites has dramatically reduced.  She can eat well and denies abdominal pain.  She reports that her bowels are moving well and denies constipation.  CT scan of the abdomen and pelvis performed on 07/09/2017 revealed mild ascites decreased since prior study.  Previously seen left omental soft tissue mass has nearly completely resolved now measuring 3.8 x 0.8 cm.  Previously seen right lower quadrant peritoneal mass is no  longer visualized.  Complex cystic and solid mass in the central pelvis is decreased in size since the previous study currently measuring 9.4 x 8.3 cm.  The previously seen peritoneal tumor implant along the dome of the right hepatic lobe has nearly completely resolved since the previous study.  There is no lymphadenopathy.  Ca1 25 drawn on day 1 of cycle 3 of her  chemotherapy has substantially reduced to 64.5.  Interval Hx:  On 07/24/17 she was admitted for a robotic assisted total hysterectomy, BSO, omentectomy, radical tumor debulking. She had minimal omental disease which was completely resected. She did still have an 8cm necrotic tumor mass on the left ovary adherent to the anterior wall of the rectum. The was resected with a thin rind of tumor remainng on the rectum. There was significant oozing at the surgical bed and thrombotic agents were used and blood was transfused postop. She was discharged on day 2.  She was readmitted on day 5 postop (07/29/17) with postop fevers. CT imaging showed stranding in the cul de sac consistent with infected hematoma, but not organized abscess. She was started on IV antibiotics (zosyn) which were continued until she remained afebrile x 2 days. On 08/02/16 she developed gross hematuria. A foley was placed. CT urogram confirmed a bladder injury. Dr Matilde Sprang from urology was consulted and the foley was continued.   She completed 2 additional cycles of adjuvant carboplatin and paclitaxel on 10/31/17 (total of 5). CA 125 on day 1 of cycle 5 was 9.9 and 3 weeks later remained normal at 12.5 (11/22/17).  Post treatment CT imaging pending.  Voiding cystogram on 12/01/17 showed complete resolution of vesicovaginal fistula with conservative foley management.   A UTI had been diagnosed by [facility] providers in June, however was not treated due to active c diff.   Review of Systems:  Constitutional  Feels less fatigued Cardiovascular  No chest pain, shortness of breath, or edema  Pulmonary  No cough or wheeze.  Gastro Intestinal  No nausea, vomitting, or diarrhoea. No bright red blood per rectum, no abdominal pain, ports an episode of diarrhea last week change in bowel movement, or constipation or early satiety. New symptom of feeling full/tight across lower abdomen.  Genito Urinary  No further leakage Musculo  Skeletal  No myalgia, arthralgia, joint swelling reports twinges of the anterior abdominal wall Neurologic  No weakness, numbness, change in gait,  Psychology  No depression, anxiety, insomnia.    Current Meds:  Outpatient Encounter Medications as of 02/07/2018  Medication Sig  . acetaminophen (TYLENOL) 500 MG tablet Take 500 mg by mouth every 6 (six) hours as needed for mild pain or moderate pain.   Marland Kitchen saccharomyces boulardii (FLORASTOR) 250 MG capsule Take 1 capsule (250 mg total) by mouth 2 (two) times daily.  . vancomycin (VANCOCIN HCL) 125 MG capsule Take as directed by Dr. Carlean Purl  . Calcium Carbonate-Vitamin D (CALCIUM + D PO) Take 1 tablet by mouth daily.   . [DISCONTINUED] prochlorperazine (COMPAZINE) 10 MG tablet Take 1 tablet (10 mg total) every 6 (six) hours as needed by mouth (Nausea or vomiting).   No facility-administered encounter medications on file as of 02/07/2018.     Allergy: No Known Allergies  Social Hx:   Social History   Socioeconomic History  . Marital status: Divorced    Spouse name: Not on file  . Number of children: 4  . Years of education: Not on file  . Highest education level: Not on file  Occupational History  . Occupation: retired-- Manufacturing engineer  Social Needs  . Financial resource strain: Not on file  . Food insecurity:    Worry: Not on file    Inability: Not on file  . Transportation needs:    Medical: Not on file    Non-medical: Not on file  Tobacco Use  . Smoking status: Never Smoker  . Smokeless tobacco: Never Used  Substance and Sexual Activity  . Alcohol use: Yes    Alcohol/week: 1.0 standard drinks    Types: 1 Cans of beer per week    Comment: no alcohol comsumption in past month   . Drug use: No  . Sexual activity: Not Currently    Partners: Male  Lifestyle  . Physical activity:    Days per week: Not on file    Minutes per session: Not on file  . Stress: Not on file  Relationships  . Social connections:    Talks on  phone: Not on file    Gets together: Not on file    Attends religious service: Not on file    Active member of club or organization: Not on file    Attends meetings of clubs or organizations: Not on file    Relationship status: Not on file  . Intimate partner violence:    Fear of current or ex partner: Not on file    Emotionally abused: Not on file    Physically abused: Not on file    Forced sexual activity: Not on file  Other Topics Concern  . 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 ABDOMINAL HYSTERECTOMY    . BREAST SURGERY Right    lumpectomy-benign  . CERVIX SURGERY     "freezing" dysplasia  . COLONOSCOPY    . DEBULKING N/A 07/24/2017   Procedure: RADICAL TUMOR DEBULKING;  Surgeon: Everitt Amber, MD;  Location: [facility];  Service: Gynecology;  Laterality: N/A;  . HYSTEROSCOPY    . IR FLUORO GUIDE PORT INSERTION RIGHT  05/15/2017  . IR REMOVAL TUN ACCESS W/ PORT W/O FL MOD SED  12/11/2017  . IR US GUIDE VASC ACCESS RIGHT  05/15/2017  . NEPHRECTOMY  01/1998   right--donation  . OMENTECTOMY N/A 07/24/2017   Procedure: OMENTECTOMY;  Surgeon: Everitt Amber, MD;  Location: [facility];  Service: Gynecology;  Laterality: N/A;  . ROBOTIC ASSISTED TOTAL HYSTERECTOMY WITH BILATERAL SALPINGO OOPHERECTOMY N/A 07/24/2017   Procedure: XI ROBOTIC ASSISTED TOTAL HYSTERECTOMY WITH BILATERAL SALPINGO OOPHORECTOMY;  Surgeon: Everitt Amber, MD;  Location: [facility];  Service: Gynecology;  Laterality: N/A;  . SHOULDER ARTHROSCOPY Left    "impingement" "frozen shoulder"  . SHOULDER ARTHROSCOPY WITH SUBACROMIAL DECOMPRESSION Right 10/23/2013   Procedure: RIGHT SHOULDER ARTHROSCOPY WITH SUBACROMIAL DECOMPRESSION, EVULATION UNDER ANESTHESIA  AND MANIPULATION UNDER ANESTHESIA;  Surgeon: Johnn Hai, MD;  Location: [facility];  Service: Orthopedics;  Laterality:  Right;    Past Medical Hx:  Past Medical History:  Diagnosis Date  . Anxiety    claustrophobia, fear of heights  . Complication of anesthesia    remembers severe sore throat after kidney surgery and feeling dizzy  . 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 Left shoulder pain   . Osteoporosis   . Ovarian cancer ([place])    ovarian cancer   .  Premature atrial complexes    see EKG in epic   . Recurrent colitis due to Clostridium difficile 2019  . Skin lesion    "pre-cancer" -past hx.  Marland Kitchen UTI (urinary tract infection)     Past Gynecological History: Gravida 5 para 4 menopause occurred 16 years ago after hysteroscopy for abnormal bleeding remote history of abnormal Pap tests treated with cryotherapy  Family Hx:  Family History  Problem Relation Age of Onset  . Heart disease Mother        mitral valve prolapse  . Hypertension Mother   . Hyperlipidemia Mother   . Cancer Mother 60       breast  . Osteopenia Mother   . Heart disease Father        bypass  . Hypertension Father   . Hyperlipidemia Father   . Cancer Father 54       bladder,  prostate  . Melanoma Father 47  . Hyperlipidemia Sister   . Colon cancer Maternal Grandfather        dx 75-80  . Kidney cancer Maternal Aunt        dx in her 71s  . Stroke Paternal Grandfather     Vitals:  Blood pressure (!) 141/80, pulse 86, temperature 98.1 F (36.7 C), resp. rate 20, height 5' 3.5" (1.613 m), weight 93 lb 6.4 oz (42.4 kg), SpO2 100 %.  Physical Exam: WD in NAD Neck  Supple NROM, without any enlargements.  Lymph Node Survey No cervical supraclavicular or inguinal adenopathy Cardiovascular  Pulse normal rate, regularity and rhythm. S1 and S2 normal.  Lungs  Clear to auscultation bilaterally, without wheezes/crackles/rhonchi. Good air movement.  Skin  No rash/lesions/breakdown  Psychiatry  Alert and oriented appropriate mood affect speech and reasoning. Abdomen  Normoactive bowel sounds, abdomen soft,  non-tender.  No masses. Incisions well healed. No masses. Back No CVA tenderness Genito Urinary  Vulva/vagina: vaginal cuff in tact, smooth, no lesions.  Rectal  deferred Extremities  No bilateral cyanosis, clubbing or edema.   Thereasa Solo, MD, PhD 02/07/2018, 10:32 AM

## 2018-02-07 ENCOUNTER — Encounter: Payer: Self-pay | Admitting: Gynecologic Oncology

## 2018-02-07 ENCOUNTER — Inpatient Hospital Stay: Payer: Medicare Other | Attending: Gynecologic Oncology | Admitting: Gynecologic Oncology

## 2018-02-07 ENCOUNTER — Inpatient Hospital Stay: Payer: Medicare Other

## 2018-02-07 VITALS — BP 141/80 | HR 86 | Temp 98.1°F | Resp 20 | Ht 63.5 in | Wt 93.4 lb

## 2018-02-07 DIAGNOSIS — N390 Urinary tract infection, site not specified: Secondary | ICD-10-CM | POA: Diagnosis not present

## 2018-02-07 DIAGNOSIS — N3000 Acute cystitis without hematuria: Secondary | ICD-10-CM

## 2018-02-07 DIAGNOSIS — C569 Malignant neoplasm of unspecified ovary: Secondary | ICD-10-CM

## 2018-02-07 DIAGNOSIS — C801 Malignant (primary) neoplasm, unspecified: Secondary | ICD-10-CM

## 2018-02-07 DIAGNOSIS — Z9221 Personal history of antineoplastic chemotherapy: Secondary | ICD-10-CM | POA: Diagnosis not present

## 2018-02-07 DIAGNOSIS — Z90722 Acquired absence of ovaries, bilateral: Secondary | ICD-10-CM | POA: Diagnosis not present

## 2018-02-07 DIAGNOSIS — Z90710 Acquired absence of both cervix and uterus: Secondary | ICD-10-CM | POA: Diagnosis not present

## 2018-02-07 DIAGNOSIS — C786 Secondary malignant neoplasm of retroperitoneum and peritoneum: Secondary | ICD-10-CM

## 2018-02-07 DIAGNOSIS — C562 Malignant neoplasm of left ovary: Secondary | ICD-10-CM

## 2018-02-07 LAB — BASIC METABOLIC PANEL
Anion gap: 12 (ref 5–15)
BUN: 19 mg/dL (ref 8–23)
CHLORIDE: 100 mmol/L (ref 98–111)
CO2: 28 mmol/L (ref 22–32)
CREATININE: 0.98 mg/dL (ref 0.44–1.00)
Calcium: 10 mg/dL (ref 8.9–10.3)
GFR calc Af Amer: >60 mL/min (ref >60)
GFR calc non Af Amer: 56 mL/min — ABNORMAL LOW (ref >60)
Glucose, Bld: 78 mg/dL (ref 70–99)
Potassium: 4.1 mmol/L (ref 3.5–5.1)
Sodium: 140 mmol/L (ref 135–145)

## 2018-02-07 NOTE — Patient Instructions (Signed)
Please notify Dr Denman George at phone number [phone] if you notice vaginal bleeding, new pelvic or abdominal pains, bloating, feeling full easy, or a change in bladder or bowel function.   Dr Denman George has ordered a CT scan as you have completed therapy to document how things look internally. There is no sign of cancer on her exam. She will recheck a urine culture to determine if your infection is still active.  Dr Denman George will see you back in 3 months for follow-up and will check the CA 125 blood test at that appointment.

## 2018-02-08 LAB — CA 125: Cancer Antigen (CA) 125: 27.5 U/mL (ref 0.0–38.1)

## 2018-02-10 LAB — URINE CULTURE: Culture: 80000 — AB

## 2018-02-11 ENCOUNTER — Other Ambulatory Visit: Payer: Self-pay | Admitting: Gynecologic Oncology

## 2018-02-11 ENCOUNTER — Telehealth: Payer: Self-pay

## 2018-02-11 ENCOUNTER — Telehealth: Payer: Self-pay | Admitting: Oncology

## 2018-02-11 DIAGNOSIS — N390 Urinary tract infection, site not specified: Secondary | ICD-10-CM

## 2018-02-11 MED ORDER — SULFAMETHOXAZOLE-TRIMETHOPRIM 400-80 MG PO TABS: 1.0000 | ORAL_TABLET | Freq: Two times a day (BID) | ORAL | 0 refills | Status: DC

## 2018-02-11 NOTE — Telephone Encounter (Signed)
Dr. Carlean Purl called back and said that patient is on a long vancomycin taper and recommended Septra for the shortest time possible (5 days) to treat the UTI.

## 2018-02-11 NOTE — Telephone Encounter (Signed)
Thank you Dr Gessner!   

## 2018-02-11 NOTE — Telephone Encounter (Signed)
Left a message for Dr. Celesta Aver nurse regarding diagnosis of UTI and recommendations for antibiotic with past history of CDiff.

## 2018-02-11 NOTE — Telephone Encounter (Signed)
Dr Carlean Purl please advise on abx use for UTI?

## 2018-02-11 NOTE — Telephone Encounter (Signed)
Told Colleen Mckinney that her urine culture is growing 2 different organism.  Colleen John, NP spoke with Dr. Carlean Purl regarding ATB therapy with Colleen Mckinney with C-diff infection on Vancomycin. Dr. Carlean Purl stated that a short course of Septra would be fine to prescribe. Septra 1 tablet twice a day for 5 days. Pt has no burning but has been running a low grade temperature the last few days of 99.2.  She has had an occasional chill. Pt will take the prescription as prescribed. Gave her the results of her CA-125 at 27.5.  It is a little more elevated then 2 months ago which may be due to her infections. The CT scan 02-13-18 will give more information as to whether or not there is any cancer  In her abdomen and pelvis per Colleen John, NP. Pt verbalized understanding.

## 2018-02-11 NOTE — Progress Notes (Signed)
See Rn note.

## 2018-02-11 NOTE — Telephone Encounter (Signed)
I spoke to Santiago Glad and advised use trimethoprim DS and also messaged the patient

## 2018-02-12 ENCOUNTER — Encounter: Payer: Self-pay | Admitting: Gynecologic Oncology

## 2018-02-12 NOTE — Telephone Encounter (Signed)
Called Ms Gentle and let her know that she had a B-met  On 02-07-18 as well and her kidney function is fine for CT tomorrow.

## 2018-02-13 ENCOUNTER — Ambulatory Visit (HOSPITAL_COMMUNITY): Payer: Medicare Other

## 2018-02-13 ENCOUNTER — Ambulatory Visit (HOSPITAL_COMMUNITY)
Admission: RE | Admit: 2018-02-13 | Discharge: 2018-02-13 | Disposition: A | Discharge status: Home / Self Care | Payer: Medicare Other | Source: Ambulatory Visit | Attending: Gynecologic Oncology | Admitting: Gynecologic Oncology

## 2018-02-13 ENCOUNTER — Encounter (HOSPITAL_COMMUNITY): Payer: Self-pay

## 2018-02-13 DIAGNOSIS — R188 Other ascites: Secondary | ICD-10-CM | POA: Diagnosis not present

## 2018-02-13 DIAGNOSIS — C801 Malignant (primary) neoplasm, unspecified: Secondary | ICD-10-CM | POA: Diagnosis present

## 2018-02-13 DIAGNOSIS — C562 Malignant neoplasm of left ovary: Secondary | ICD-10-CM

## 2018-02-13 DIAGNOSIS — C786 Secondary malignant neoplasm of retroperitoneum and peritoneum: Secondary | ICD-10-CM | POA: Diagnosis present

## 2018-02-13 DIAGNOSIS — Z90710 Acquired absence of both cervix and uterus: Secondary | ICD-10-CM | POA: Insufficient documentation

## 2018-02-13 MED ORDER — IOHEXOL 300 MG/ML  SOLN
100.0000 mL | Freq: Once | INTRAMUSCULAR | Status: AC | PRN
  Administered 2018-02-13: 80 mL via INTRAVENOUS

## 2018-02-18 ENCOUNTER — Telehealth: Payer: Self-pay | Admitting: Oncology

## 2018-02-18 NOTE — Telephone Encounter (Signed)
Requested ER/PR and MSI testing on Accession: [id number] with Maudie Mercury in [facility].

## 2018-02-20 ENCOUNTER — Telehealth: Payer: Self-pay | Admitting: Gynecologic Oncology

## 2018-02-20 ENCOUNTER — Telehealth: Payer: Self-pay

## 2018-02-20 ENCOUNTER — Telehealth: Payer: Self-pay | Admitting: Oncology

## 2018-02-20 ENCOUNTER — Other Ambulatory Visit (HOSPITAL_COMMUNITY)
Admission: RE | Admit: 2018-02-20 | Discharge: 2018-02-20 | Disposition: A | Discharge status: Home / Self Care | Payer: Medicare Other | Source: Ambulatory Visit | Attending: Gynecologic Oncology | Admitting: Gynecologic Oncology

## 2018-02-20 ENCOUNTER — Encounter: Payer: Self-pay | Admitting: Gynecologic Oncology

## 2018-02-20 DIAGNOSIS — C562 Malignant neoplasm of left ovary: Secondary | ICD-10-CM | POA: Diagnosis present

## 2018-02-20 NOTE — Telephone Encounter (Signed)
Received a VM from Elmo Putt, GYN navigator, regarding questions about plan of care for Ms. Colleen Mckinney.  Patient has had a progression of her ovarian cancer and will require additional tx.  Dr. Alvy Bimler and Santiago Glad would like to know if there are additional plans about potential FMT?  Dr. Carlean Purl please let me know if Ms. Colleen Mckinney has any planned tx with GI.  They would like to start Ms. Colleen Mckinney on tx for her CA but want your input.

## 2018-02-20 NOTE — Telephone Encounter (Signed)
Called patient to discuss CT scan results.  Advised that Dr. Alvy Bimler is aware of the results as well and will plan to meet with her in the office on Tues, Aug 27 at 10:15am.  Patient reports intermittent numbness in her lips, right shoulder pain that comes on every other day for the past two weeks that improved slightly with ibuprofen, indigestion, low grade fever of 99.2 once or twice a day, increased urinary frequency after starting antibiotics for UTI.  All questions answered.  Our office will be reaching out to Dr. Carlean Purl to inquire about next steps related to her recent cdiff infection.  Patient asking would she suffer if she did nothing treatment wise.  Discussed briefly and advised additional information/questions could be addressed at her appt as well. No concerns voiced at end of call.  Patient advised to call for any needs, concerns, or additional questions if any before her appt on Tues.

## 2018-02-20 NOTE — Telephone Encounter (Signed)
Left a message for Sherri, RN at Dr. Celesta Aver office regarding plan for CDiff treatment before patient restarts chemotherapy.

## 2018-02-21 NOTE — Telephone Encounter (Signed)
If she is not having diarrhea right now then do not think we should do anything as far as Tx  If she gets C diff problems again I would be inclined to treat her with vancomycin + the monoclonal ab for C diff that reduces recurrence bexlotoxumab as opposed to fecal microbiotica transplant but will depend upon how she is

## 2018-02-26 ENCOUNTER — Inpatient Hospital Stay: Payer: Medicare Other

## 2018-02-26 ENCOUNTER — Inpatient Hospital Stay (HOSPITAL_BASED_OUTPATIENT_CLINIC_OR_DEPARTMENT_OTHER): Payer: Medicare Other | Admitting: Hematology and Oncology

## 2018-02-26 ENCOUNTER — Telehealth: Payer: Self-pay | Admitting: Hematology and Oncology

## 2018-02-26 VITALS — BP 128/63 | HR 80 | Temp 97.8°F | Resp 18 | Ht 63.5 in | Wt 95.2 lb

## 2018-02-26 DIAGNOSIS — N820 Vesicovaginal fistula: Secondary | ICD-10-CM | POA: Diagnosis not present

## 2018-02-26 DIAGNOSIS — Z7189 Other specified counseling: Secondary | ICD-10-CM

## 2018-02-26 DIAGNOSIS — N390 Urinary tract infection, site not specified: Secondary | ICD-10-CM

## 2018-02-26 DIAGNOSIS — C562 Malignant neoplasm of left ovary: Secondary | ICD-10-CM

## 2018-02-26 DIAGNOSIS — G893 Neoplasm related pain (acute) (chronic): Secondary | ICD-10-CM | POA: Diagnosis not present

## 2018-02-26 DIAGNOSIS — C786 Secondary malignant neoplasm of retroperitoneum and peritoneum: Secondary | ICD-10-CM

## 2018-02-26 DIAGNOSIS — C569 Malignant neoplasm of unspecified ovary: Secondary | ICD-10-CM | POA: Diagnosis not present

## 2018-02-26 LAB — COMPREHENSIVE METABOLIC PANEL
ALT: 9 U/L (ref 0–44)
AST: 18 U/L (ref 15–41)
Albumin: 4.2 g/dL (ref 3.5–5.0)
Alkaline Phosphatase: 66 U/L (ref 38–126)
Anion gap: 9 (ref 5–15)
BILIRUBIN TOTAL: 0.4 mg/dL (ref 0.3–1.2)
BUN: 20 mg/dL (ref 8–23)
CALCIUM: 9.9 mg/dL (ref 8.9–10.3)
CO2: 30 mmol/L (ref 22–32)
CREATININE: 0.93 mg/dL (ref 0.44–1.00)
Chloride: 101 mmol/L (ref 98–111)
GFR calc Af Amer: >60 mL/min (ref >60)
GFR calc non Af Amer: 60 mL/min — ABNORMAL LOW (ref >60)
Glucose, Bld: 84 mg/dL (ref 70–99)
Potassium: 4.3 mmol/L (ref 3.5–5.1)
Sodium: 140 mmol/L (ref 135–145)
TOTAL PROTEIN: 8.1 g/dL (ref 6.5–8.1)

## 2018-02-26 LAB — CBC WITH DIFFERENTIAL/PLATELET
BASOS ABS: 0 10*3/uL (ref 0.0–0.1)
BASOS PCT: 1 %
EOS ABS: 0.1 10*3/uL (ref 0.0–0.5)
Eosinophils Relative: 1 %
HCT: 33.3 % — ABNORMAL LOW (ref 34.8–46.6)
Hemoglobin: 11.4 g/dL — ABNORMAL LOW (ref 11.6–15.9)
Lymphocytes Relative: 12 %
Lymphs Abs: 1.1 10*3/uL (ref 0.9–3.3)
MCH: 31 pg (ref 25.1–34.0)
MCHC: 34.2 g/dL (ref 31.5–36.0)
MCV: 90.5 fL (ref 79.5–101.0)
Monocytes Absolute: 0.7 10*3/uL (ref 0.1–0.9)
Monocytes Relative: 8 %
Neutro Abs: 6.9 10*3/uL — ABNORMAL HIGH (ref 1.5–6.5)
Neutrophils Relative %: 78 %
PLATELETS: 361 10*3/uL (ref 145–400)
RBC: 3.68 MIL/uL — AB (ref 3.70–5.45)
RDW: 12.7 % (ref 11.2–14.5)
WBC: 8.7 10*3/uL (ref 3.9–10.3)

## 2018-02-26 LAB — URINALYSIS, COMPLETE (UACMP) WITH MICROSCOPIC
Bilirubin Urine: NEGATIVE
Glucose, UA: NEGATIVE mg/dL
HGB URINE DIPSTICK: NEGATIVE
KETONES UR: NEGATIVE mg/dL
Leukocytes, UA: NEGATIVE
NITRITE: NEGATIVE
PH: 5 (ref 5.0–8.0)
Protein, ur: NEGATIVE mg/dL
Specific Gravity, Urine: 1.013 (ref 1.005–1.030)

## 2018-02-26 NOTE — Telephone Encounter (Signed)
Pt scheduled per 8/27 los.

## 2018-02-27 ENCOUNTER — Encounter: Payer: Self-pay | Admitting: Hematology and Oncology

## 2018-02-27 ENCOUNTER — Telehealth: Payer: Self-pay | Admitting: Oncology

## 2018-02-27 ENCOUNTER — Telehealth: Payer: Self-pay

## 2018-02-27 ENCOUNTER — Other Ambulatory Visit: Payer: Self-pay | Admitting: Hematology and Oncology

## 2018-02-27 DIAGNOSIS — N390 Urinary tract infection, site not specified: Secondary | ICD-10-CM

## 2018-02-27 DIAGNOSIS — Z7189 Other specified counseling: Secondary | ICD-10-CM | POA: Insufficient documentation

## 2018-02-27 LAB — CA 125: Cancer Antigen (CA) 125: 184 U/mL — ABNORMAL HIGH (ref 0.0–38.1)

## 2018-02-27 NOTE — Assessment & Plan Note (Signed)
She has intermittent right lower quadrant discomfort which I suspect is due to disease.  They coincide at the location of her tumor recurrence in her pelvis.  We discussed the role of cancer pain management and she has prescription narcotics to take at home as needed

## 2018-02-27 NOTE — Telephone Encounter (Signed)
Called Colleen Mckinney and she would like to go ahead with the [facility] referral.  Left a message for [facility] new patient referral line.

## 2018-02-27 NOTE — Telephone Encounter (Signed)
-----   Message from Heath Lark, MD sent at 02/27/2018  1:21 PM EDT ----- Regarding: urine culture Urine culture is still positive but due to her recurrent C-Diff history I will consult ID before planning to treat.

## 2018-02-27 NOTE — Telephone Encounter (Signed)
Called and given below message. She verbalized understanding. 

## 2018-02-27 NOTE — Assessment & Plan Note (Signed)
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 difficile and recurrent urinary tract infection that will make her high risk for further systemic treatment Response rates for future treatment is expected to be low, with the anticipated high risk of infection We also reviewed additional testing on her previous tumor; 80% of her cancer tested positive for estrogen.  We discussed the risk and benefit of antiestrogen therapy but I anticipate the benefits will be low After a very prolonged discussion, she was undecided She is interested to consider second opinion at [facility] and I will send in the referral

## 2018-02-27 NOTE — Progress Notes (Signed)
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  Her catheter was finally removed but the patient had recurrent symptoms of dysuria and has been treated with multiple courses of antibiotics. She just completed another course of antibiotic less than 2 weeks ago and still have symptoms of dysuria Urine culture came back positive but due to her history of recurrent C. difficile, I recommend infectious disease consultation for discussion about the role of future antibiotic treatment  Cancer associated pain She has intermittent right lower quadrant discomfort which I suspect is due to disease.  They coincide at the location of her tumor recurrence in her pelvis.  We discussed the role of cancer pain management and she has prescription narcotics to take at home as needed  Goals of care, counseling/discussion We had extensive goals of care  discussion The patient is platinum refractory She is frail with recurrent infection Quality of life is important to her According to the patient, she has advanced directive and living will and has dedicated medical healthcare power of attorney We discussed the risk and benefits of palliative chemotherapy versus referral to tertiary center for consideration for clinical trial versus palliative care/hospice The patient is undecided We discussed prognosis Without treatment, I anticipate prognosis will be less than 6 months She will call me after she has further discussion with family members about her plan of care   Orders Placed This Encounter  Procedures  . Urine Culture    Standing Status:   Future    Number of Occurrences:   1    Standing Expiration Date:   04/02/2019  . CBC with Differential/Platelet    Standing Status:   Future    Number of Occurrences:   1    Standing Expiration Date:   04/02/2019  . Comprehensive metabolic panel    Standing Status:   Future    Number of Occurrences:   1    Standing Expiration Date:   04/02/2019  . CA 125    Standing Status:   Future    Number of Occurrences:   1    Standing Expiration Date:   04/02/2019  . Urinalysis, Complete w Microscopic    Standing Status:   Future    Number of Occurrences:   1    Standing Expiration Date:   02/27/2019    INTERVAL HISTORY: Please see below for problem oriented charting. She returns with family members to review recent test results In the meantime, she has been complaining  of intermittent right lower quadrant discomfort that comes and goes She denies nausea or constipation She have persistent dysuria despite completing a course of antibiotic treatment for recurrent UTI She denies recent diarrhea No recent fever or chills Her appetite is stable and she have not lost any weight  SUMMARY OF ONCOLOGIC HISTORY: Oncology History   Neg genetics ER 80%, PR 0%     Peritoneal carcinomatosis ([place])    04/26/2017 Initial Diagnosis    Peritoneal carcinomatosis ([place])     Left ovarian epithelial cancer ([place])   04/24/2017 Initial Diagnosis    She reports a gradual sensation of pelvic heaviness which she thought might be related to genital prolapse.  She presented for evaluation of an ultrasound was performed which demonstrated a uterus measuring 6 x 5 cm with a 1.3 mm endometrial stripe.  Multiple heterogeneous pelvic masses were appreciated the right measured 8 x 5 cm and another measured 10 cm just posterior to the uterus CA-125 was obtained and returned a value of 404.8. She also reports early satiety for approximately 2 months and a 10 pound weight loss over the last year.  She also reports mild abdominal bloating and sensation of pelvic heaviness    04/25/2017 Imaging    Ct abdomen and pelvis: 1. Appearance compatible with ovarian malignancy with widespread peritoneal, mesenteric, and omental metastatic disease. Specifically, a 12.7 cm primarily solid and partially cystic mass above the uterus probably arises from the right ovary and is associated with omental caking, complex ascites with peritoneal metastatic deposits, and mesenteric deposits of tumor. 2. Mild intrahepatic biliary dilatation. The extrahepatic biliary tree is within normal limits. 3.  Aortic Atherosclerosis (ICD10-I70.0).    05/01/2017 Procedure    Successful CT-guided left abdominal omental mass 18 gauge core biopsies    05/01/2017 Pathology Results    Soft Tissue Needle Core Biopsy, Left omental - METASTATIC CARCINOMA, SEE COMMENT. 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 She did not complete cycle 6 of chemo due to recurrent infection with C. difficile    05/15/2017 Procedure    Successful placement of a right  internal jugular approach power injectable Port-A-Cath. The catheter is ready for immediate use.    06/25/2017 Tumor Marker    Patient's tumor was tested for the following markers: CA-125 Results of the tumor marker test revealed 64.5    07/09/2017 Imaging    Decreased size of cystic and solid central pelvic mass since previous study.  Near complete resolution of peritoneal metastatic disease and ascites since previous study.  No new or progressive metastatic disease identified. No evidence of metastatic disease within the thorax.  Large stool burden noted; recommend clinical correlation for possible constipation.    07/24/2017 Surgery    Operation: Robotic-assisted laparoscopic total hysterectomy with bilateral salpingoophorectomy, omentectomy, radical tumor debulking  Surgeon: [name]  Operative Findings:  : 3cm left omental/splenic flexure tumor nodule adherent to transverse colon. 10cm pelvic tumor from left ovary, adherent to uterus and rectum, necrotic. Left retroperitoneal infiltration.  Tumor rind remaining on posterior cul de sac and distal rectum - very thin, <1cm thick, representing optimal cytoreductive procedure.       07/24/2017 Pathology Results    1. Omentum, resection for tumor - METASTATIC CARCINOMA. 2. Uterus +/- tubes/ovaries, neoplastic - LEFT OVARY: HIGH GRADE SEROUS CARCINOMA, SPANNING APPROXIMATELY  9.9 CM. TUMOR INVOLVES SURFACE. TREATMENT EFFECT PRESENT. SEE ONCOLOGY TABLE. - RIGHT OVARY: INVOLVED BY HIGH GRADE SEROUS CARCINOMA. - BILATERAL FALLOPIAN TUBES: INVOLVED BY HIGH GRADE SEROUS CARCINOMA. - UTERUS: -ENDOMETRIUM: ENDOMETRIAL TYPE POLYP. INACTIVE ENDOMETRIUM. NO HYPERPLASIA OR MALIGNANCY. -MYOMETRIUM: LEIOMYOMATA. NO MALIGNANCY. -SEROSA: INVOLVED BY HIGH GRADE SEROUS CARCINOMA. - CERVIX: HIGH GRADE SEROUS CARCINOMA PRESENT AT ANTERIOR CERVICAL SOFT TISSUE MARGIN. Microscopic Comment 2. OVARY Specimen(s): Uterus, cervix, bilateral  ovaries and fallopian tubes, omentum. Procedure: (including lymph node sampling): Total hysterectomy with bilateral salpingo-oophorectomy. Omentectomy. Primary tumor site (including laterality): Left ovary. Ovarian surface involvement: Present. Ovarian capsule intact without fragmentation: Disrupted. Maximum tumor size (cm): Estimated 9.9 cm. Histologic type: High grade serous carcinoma. Grade: High grade (FIGO N/A) Peritoneal implants: (specify invasive or non-invasive): Present (>2 cm) as in part #1. Pelvic extension (list additional structures on separate lines and if involved): Right ovary, bilateral fallopian tubes, cervical soft tissue. Lymph nodes: number examined 0 ; number positive 0 TNM code: ypT3c, ypNX, ypMX FIGO Stage (based on pathologic findings, needs clinical correlation): IIIC Comments: None.    07/24/2017 - 07/26/2017 Hospital Admission    She was admitted to the hospital for surgical debulking, complicated by anemia requiring blood transfusion.    07/29/2017 - 08/02/2017 Hospital Admission    She was admitted to the hospital for management of hematoma and bladder injury.    07/29/2017 Imaging    1. Blood products and large volume hemostatic material in the pelvis correlating with operative history. Sterility is indeterminate by imaging. No organized collection for drainage. 2. High-density posterior to the pelvic collection was present preoperatively and is attributed to mass calcification. No bowel leak is seen with oral contrast reaching the transverse colon. 3. Diffuse postoperative subcutaneous gas. Small hematomas at bilateral abdominal wall port sites. 4. Right nephrectomy.     07/31/2017 Procedure    Mild left hydronephrosis    08/01/2017 Imaging    Extravasation contrast from the posterior aspect of the bladder into the adjacent tissues    08/22/2017 Imaging    Persistent posterior left bladder wall leak near the midline, with contrast filling of a  well-defined crescent-shaped extraluminal contrast collection posterior to the left bladder wall, and with vaginal fistulization demonstrated at the inferior margin of this collection    08/22/2017 Tumor Marker    Patient's tumor was tested for the following markers: CA-125 Results of the tumor marker test revealed 13.5    09/04/2017 Tumor Marker    Patient's tumor was tested for the following markers: CA-125 Results of the tumor marker test revealed 12.5    09/25/2017 Tumor Marker    Patient's tumor was tested for the following markers: CA-125 Results of the tumor marker test revealed 11.3    09/28/2017 Imaging    1. Status post hysterectomy. No evidence of recurrent or metastatic disease. 2. Hyperattenuation posterior to the urinary bladder is likely the site of previously described contrast extravasation. No well-defined fluid collection or fistulous communication to the vagina identified. 3. Aortic Atherosclerosis (ICD10-I70.0). 8 mm splenic artery aneurysm. 4. Right nephrectomy.     10/17/2017 Genetic Testing    ATM c.8428A>C (p.Lys2810Gln) and AXIN2 c.1235A>G (p.Asn412Ser) VUS identified on the [employer] Myrisk panel.  The [facility] gene panel offered by [employer] includes sequencing and deletion/duplication testing of the following 28 genes: APC, ATM, BARD1, BMPR1A, BRCA1, BRCA2, BRIP1, CHD1, CDK4, CDKN2A, CHEK2, EPCAM (large rearrangement only), MLH1, MSH2, MSH6, MUTYH, NBN, PALB2, PMS2, PTEN, RAD51C, RAD51D, SMAD4, STK11, and TP53. Sequencing  was performed for select regions of POLE and POLD1, and large rearrangement analysis was performed for select regions of GREM1. The report date is October 15, 2017.  HRD testing was negative.  There were no BRCA mutations and the genomic instability was negative.  The report date is October 17, 2017.     10/31/2017 Tumor Marker    Patient's tumor was tested for the following markers: CA-125 Results of the tumor marker test revealed 9.9     12/12/2017 Procedure    Status post right IJ port catheter removal.    02/13/2018 Imaging    Increased size of partially calcified pelvic mass within the hysterectomy bed.  New peritoneal metastatic disease and minimal ascites    02/26/2018 Tumor Marker    Patient's tumor was tested for the following markers: CA-125 Results of the tumor marker test revealed 184     REVIEW OF SYSTEMS:   Constitutional: Denies fevers, chills or abnormal weight loss Eyes: Denies blurriness of vision Ears, nose, mouth, throat, and face: Denies mucositis or sore throat Respiratory: Denies cough, dyspnea or wheezes Cardiovascular: Denies palpitation, chest discomfort or lower extremity swelling Gastrointestinal:  Denies nausea, heartburn or change in bowel habits Skin: Denies abnormal skin rashes Lymphatics: Denies new lymphadenopathy or easy bruising Neurological:Denies numbness, tingling or new weaknesses Behavioral/Psych: Mood is stable, no new changes  All other systems were reviewed with the patient and are negative.  I have reviewed the past medical history, past surgical history, social history and family history with the patient and they are unchanged from previous note.  ALLERGIES:  has No Known Allergies.  MEDICATIONS:  Current Outpatient Medications  Medication Sig Dispense Refill  . acetaminophen (TYLENOL) 500 MG tablet Take 500 mg by mouth every 6 (six) hours as needed for mild pain or moderate pain.     Marland Kitchen saccharomyces boulardii (FLORASTOR) 250 MG capsule Take 1 capsule (250 mg total) by mouth 2 (two) times daily. 60 capsule 1   No current facility-administered medications for this visit.     PHYSICAL EXAMINATION: ECOG PERFORMANCE STATUS: 1 - Symptomatic but completely ambulatory  Vitals:   02/26/18 1015  BP: 128/63  Pulse: 80  Resp: 18  Temp: 97.8 F (36.6 C)  SpO2: 100%   Filed Weights   02/26/18 1015  Weight: 95 lb 3.2 oz (43.2 kg)    GENERAL:alert, no distress and  comfortable.  She looks thin and cachectic SKIN: skin color, texture, turgor are normal, no rashes or significant lesions EYES: normal, Conjunctiva are pink and non-injected, sclera clear OROPHARYNX:no exudate, no erythema and lips, buccal mucosa, and tongue normal  NECK: supple, thyroid normal size, non-tender, without nodularity LYMPH:  no palpable lymphadenopathy in the cervical, axillary or inguinal LUNGS: clear to auscultation and percussion with normal breathing effort HEART: regular rate & rhythm and no murmurs and no lower extremity edema ABDOMEN:abdomen soft, non-tender and normal bowel sounds.  Palpable right lower quadrant mass Musculoskeletal:no cyanosis of digits and no clubbing  NEURO: alert & oriented x 3 with fluent speech, no focal motor/sensory deficits  LABORATORY DATA:  I have reviewed the data as listed    Component Value Date/Time   NA 140 02/26/2018 1129   NA 140 06/25/2017 1003   K 4.3 02/26/2018 1129   K 4.6 06/25/2017 1003   CL 101 02/26/2018 1129   CO2 30 02/26/2018 1129   CO2 29 06/25/2017 1003   GLUCOSE 84 02/26/2018 1129   GLUCOSE 94 06/25/2017 1003   GLUCOSE 81  05/18/2006 1112   BUN 20 02/26/2018 1129   BUN 14.8 06/25/2017 1003   CREATININE 0.93 02/26/2018 1129   CREATININE 0.9 06/25/2017 1003   CALCIUM 9.9 02/26/2018 1129   CALCIUM 9.5 06/25/2017 1003   PROT 8.1 02/26/2018 1129   PROT 7.0 06/25/2017 1003   ALBUMIN 4.2 02/26/2018 1129   ALBUMIN 3.7 06/25/2017 1003   AST 18 02/26/2018 1129   AST 17 06/25/2017 1003   ALT 9 02/26/2018 1129   ALT 11 06/25/2017 1003   ALKPHOS 66 02/26/2018 1129   ALKPHOS 88 06/25/2017 1003   BILITOT 0.4 02/26/2018 1129   BILITOT 0.22 06/25/2017 1003   GFRNONAA 60 (L) 02/26/2018 1129   GFRAA >60 02/26/2018 1129    No results found for: SPEP, UPEP  Lab Results  Component Value Date   WBC 8.7 02/26/2018   NEUTROABS 6.9 (H) 02/26/2018   HGB 11.4 (L) 02/26/2018   HCT 33.3 (L) 02/26/2018   MCV 90.5  02/26/2018   PLT 361 02/26/2018      Chemistry      Component Value Date/Time   NA 140 02/26/2018 1129   NA 140 06/25/2017 1003   K 4.3 02/26/2018 1129   K 4.6 06/25/2017 1003   CL 101 02/26/2018 1129   CO2 30 02/26/2018 1129   CO2 29 06/25/2017 1003   BUN 20 02/26/2018 1129   BUN 14.8 06/25/2017 1003   CREATININE 0.93 02/26/2018 1129   CREATININE 0.9 06/25/2017 1003      Component Value Date/Time   CALCIUM 9.9 02/26/2018 1129   CALCIUM 9.5 06/25/2017 1003   ALKPHOS 66 02/26/2018 1129   ALKPHOS 88 06/25/2017 1003   AST 18 02/26/2018 1129   AST 17 06/25/2017 1003   ALT 9 02/26/2018 1129   ALT 11 06/25/2017 1003   BILITOT 0.4 02/26/2018 1129   BILITOT 0.22 06/25/2017 1003       RADIOGRAPHIC STUDIES: I have reviewed multiple imaging study with the patient and family I have personally reviewed the radiological images as listed and agreed with the findings in the report. Ct Abdomen Pelvis W Contrast  Result Date: 02/13/2018 CLINICAL DATA:  Followup left ovarian epithelial carcinoma and peritoneal carcinomatosis. Status post surgery and chemotherapy. EXAM: CT ABDOMEN AND PELVIS WITH CONTRAST TECHNIQUE: Multidetector CT imaging of the abdomen and pelvis was performed using the standard protocol following bolus administration of intravenous contrast. CONTRAST:  52m OMNIPAQUE IOHEXOL 300 MG/ML  SOLN COMPARISON:  09/28/2017 FINDINGS: Lower Chest: No acute findings. Hepatobiliary: No intrahepatic masses are identified, however there is a new low-attenuation mass along the capsular surface of the liver dome which measures 3.6 x 2.4 x 2.9 cm, and is consistent with peritoneal metastasis. Gallbladder is unremarkable. Pancreas:  No mass or inflammatory changes. Spleen: Within normal limits in size and appearance. Adrenals/Urinary Tract: Previous right nephrectomy again noted. Normal appearance of left kidney. Unremarkable unopacified urinary bladder. Stomach/Bowel: No evidence of obstruction,  inflammatory process or abnormal fluid collections. Vascular/Lymphatic: No pathologically enlarged lymph nodes. No abdominal aortic aneurysm. Reproductive: Previous hysterectomy. Increased size of partially calcified soft tissue mass in the hysterectomy bed, currently measuring 6.2 x 4.3 cm compared to 5.7 x 1.7 cm previously. Other: A new soft tissue mass is seen in the right lower quadrant mesentery measuring 6.4 x 6.8 cm. Another new soft tissue nodule is seen in the right lower quadrant along the paracolic gutter measuring 2.7 x 1.6 cm on image 47/2. Minimal ascites in both upper quadrants is also new. Musculoskeletal:  No suspicious bone lesions identified. IMPRESSION: Increased size of partially calcified pelvic mass within the hysterectomy bed. New peritoneal metastatic disease and minimal ascites. Electronically Signed   By: Earle Gell M.D.   On: 02/13/2018 11:03    All questions were answered. The patient knows to call the clinic with any problems, questions or concerns. No barriers to learning was detected.  I spent 60 minutes counseling the patient face to face. The total time spent in the appointment was 80 minutes and more than 50% was on counseling and review of test results  Heath Lark, MD 02/27/2018 1:31 PM

## 2018-02-27 NOTE — Assessment & Plan Note (Signed)
We had extensive goals of care discussion The patient is platinum refractory She is frail with recurrent infection Quality of life is important to her According to the patient, she has advanced directive and living will and has dedicated medical healthcare power of attorney We discussed the risk and benefits of palliative chemotherapy versus referral to tertiary center for consideration for clinical trial versus palliative care/hospice The patient is undecided We discussed prognosis Without treatment, I anticipate prognosis will be less than 6 months She will call me after she has further discussion with family members about her plan of care

## 2018-02-27 NOTE — Assessment & Plan Note (Signed)
The patient is known to have fistula.  Her catheter was finally removed but the patient had recurrent symptoms of dysuria and has been treated with multiple courses of antibiotics. She just completed another course of antibiotic less than 2 weeks ago and still have symptoms of dysuria Urine culture came back positive but due to her history of recurrent C. difficile, I recommend infectious disease consultation for discussion about the role of future antibiotic treatment

## 2018-02-28 ENCOUNTER — Encounter: Payer: Self-pay | Admitting: Hematology and Oncology

## 2018-02-28 ENCOUNTER — Telehealth: Payer: Self-pay

## 2018-02-28 ENCOUNTER — Other Ambulatory Visit: Payer: Self-pay

## 2018-02-28 LAB — URINE CULTURE

## 2018-02-28 MED ORDER — NITROFURANTOIN MONOHYD MACRO 100 MG PO CAPS: 100.0000 mg | ORAL_CAPSULE | Freq: Every day | ORAL | 0 refills | Status: DC

## 2018-02-28 NOTE — Telephone Encounter (Signed)
-----   Message from Heath Lark, MD sent at 02/28/2018  9:20 AM EDT ----- Regarding: urine culture Looks like she is sensitive to nitrofurantoin I recommend 100 mg daily for 3 days and then stop It is usually not associated with C-Diff

## 2018-02-28 NOTE — Telephone Encounter (Signed)
Called with below message. She verbalized understanding. Rx sent to pharmacy. 

## 2018-02-28 NOTE — Telephone Encounter (Signed)
Faxed referral to [place] at [phone].

## 2018-03-01 ENCOUNTER — Telehealth: Payer: Self-pay | Admitting: Oncology

## 2018-03-01 ENCOUNTER — Encounter: Payer: Self-pay | Admitting: Gynecologic Oncology

## 2018-03-01 NOTE — Telephone Encounter (Signed)
Colleen Mckinney called and said she has an appointment with [facility] for 03/15/18.  She said they are trying to move it up to next week and will let her know.  She also said Infectious Disease called her for an appointment.  She would like to check with Dr. Alvy Bimler to make sure the appointment is needed because she is taking macrobid.

## 2018-03-01 NOTE — Telephone Encounter (Signed)
Called Arm[place] back and advised her of Dr. Calton Dach message below.  She verbalized agreement and said she will keep the appointment.

## 2018-03-01 NOTE — Telephone Encounter (Signed)
Keep ID appt in case she has recurrent UTI again, need recommendations of what to do in the future

## 2018-03-06 ENCOUNTER — Ambulatory Visit (INDEPENDENT_AMBULATORY_CARE_PROVIDER_SITE_OTHER): Payer: Medicare Other | Admitting: Infectious Diseases

## 2018-03-06 ENCOUNTER — Encounter: Payer: Self-pay | Admitting: Infectious Diseases

## 2018-03-06 ENCOUNTER — Encounter: Payer: Medicare Other | Admitting: Internal Medicine

## 2018-03-06 DIAGNOSIS — C562 Malignant neoplasm of left ovary: Secondary | ICD-10-CM

## 2018-03-06 DIAGNOSIS — N390 Urinary tract infection, site not specified: Secondary | ICD-10-CM

## 2018-03-06 DIAGNOSIS — A0471 Enterocolitis due to Clostridium difficile, recurrent: Secondary | ICD-10-CM | POA: Diagnosis not present

## 2018-03-06 NOTE — Progress Notes (Signed)
   Subjective:    Patient ID: Colleen Mckinney, female    DOB: [birth year], 72 y.o.   MRN: [id number]  HPI 72 yo F with of Ovarian CA (last chemo May 1) and vesicovaginal fistula (had catheter for 5 months and this has "healed itself"). .  She states she has had UT and has had been treated x 3 without change in her sx.  She also had a protracted course of C diff (ending vanco taper of medication at beginning of August). Has followed Dr Carlean Purl.   Has not had sx with her "UTI". She does feel pressure on her bladder and has pain, had pain with initiation of urination, small amt of urine volume, needs to go frequently. 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  Has taken anbx twice in the last month (is unable to remember the names of these (Macrobid, bactrim)).  She had UA 8-27 with many bacteria but no nitr/LE, 0-5 WBC.  Her Cx was E coli (R- bactrim).   The past medical history, family history and social history were reviewed/updated in EPIC  Review of Systems  Constitutional: Negative for appetite change, chills, fever and unexpected weight change.  HENT: Negative for mouth sores.   Respiratory: Negative for shortness of breath.   Gastrointestinal: Negative for constipation and diarrhea.  Genitourinary: Positive for decreased urine volume, dysuria and urgency. Negative for flank pain.  no port or cath currently.  Please see HPI. All other systems reviewed and negative.      Objective:   Physical Exam  Constitutional: She is oriented to person, place, and time. She appears well-developed. She appears cachectic. No distress.  HENT:  Head: Normocephalic and atraumatic.  Eyes: Pupils are equal, round, and reactive to light. EOM are normal.  Neck: Normal range of motion. Neck supple.  Cardiovascular: Normal rate, regular rhythm and normal heart sounds.  Pulmonary/Chest: Effort normal and breath sounds normal.  Abdominal: Soft. Bowel sounds are normal. She exhibits no distension. There  is no tenderness. There is no CVA tenderness.  Musculoskeletal: Normal range of motion. She exhibits no edema.  Lymphadenopathy:    She has no cervical adenopathy.  Neurological: She is alert and oriented to person, place, and time.  Skin: Skin is warm and dry.  Psychiatric: She has a normal mood and affect.       Assessment & Plan:

## 2018-03-06 NOTE — Assessment & Plan Note (Addendum)
She is colonized.  I would not treat her at this point.  I suspect that she has been colonized since her fistula.  I explained what "colonized" means to her. I asked her to let me know if she has flank pain, fever, chills, worsening lower abd pain, change in odor of her urine.  I would not repeat her Cx or re-treat her unless these happen.  She will return to ID clinic prn.

## 2018-03-06 NOTE — Assessment & Plan Note (Signed)
Being managed by Dr Alvy Bimler.  Currently on trial of anti-estrogen.

## 2018-03-06 NOTE — Assessment & Plan Note (Signed)
Has been more constipated than diarrhea lately.  Would defer fecal txp.  No further rx at this point.   She could be put on prophylactic vanco if she were to get more chemo.

## 2018-03-12 ENCOUNTER — Encounter: Payer: Self-pay | Admitting: Hematology and Oncology

## 2018-03-13 ENCOUNTER — Telehealth: Payer: Self-pay | Admitting: Hematology and Oncology

## 2018-03-13 NOTE — Telephone Encounter (Signed)
Transferred patient to the nurse per request

## 2018-03-13 NOTE — Telephone Encounter (Signed)
LMVM with appt date/time per 9/11 sch message

## 2018-03-15 ENCOUNTER — Telehealth: Payer: Self-pay | Admitting: Hematology and Oncology

## 2018-03-15 ENCOUNTER — Inpatient Hospital Stay: Payer: Medicare Other

## 2018-03-15 ENCOUNTER — Inpatient Hospital Stay: Payer: Medicare Other | Attending: Gynecologic Oncology | Admitting: Hematology and Oncology

## 2018-03-15 ENCOUNTER — Encounter: Payer: Self-pay | Admitting: Hematology and Oncology

## 2018-03-15 VITALS — BP 129/70 | HR 92 | Temp 98.1°F | Resp 17 | Ht 63.5 in | Wt 96.6 lb

## 2018-03-15 DIAGNOSIS — C801 Malignant (primary) neoplasm, unspecified: Secondary | ICD-10-CM

## 2018-03-15 DIAGNOSIS — C786 Secondary malignant neoplasm of retroperitoneum and peritoneum: Secondary | ICD-10-CM | POA: Diagnosis not present

## 2018-03-15 DIAGNOSIS — R180 Malignant ascites: Secondary | ICD-10-CM | POA: Insufficient documentation

## 2018-03-15 DIAGNOSIS — Z87440 Personal history of urinary (tract) infections: Secondary | ICD-10-CM | POA: Diagnosis not present

## 2018-03-15 DIAGNOSIS — C562 Malignant neoplasm of left ovary: Secondary | ICD-10-CM | POA: Diagnosis present

## 2018-03-15 DIAGNOSIS — R64 Cachexia: Secondary | ICD-10-CM | POA: Insufficient documentation

## 2018-03-15 DIAGNOSIS — S3720XA Unspecified injury of bladder, initial encounter: Secondary | ICD-10-CM

## 2018-03-15 DIAGNOSIS — Z7189 Other specified counseling: Secondary | ICD-10-CM

## 2018-03-15 LAB — CBC WITH DIFFERENTIAL/PLATELET
BASOS PCT: 0 %
Basophils Absolute: 0 10*3/uL (ref 0.0–0.1)
Eosinophils Absolute: 0.1 10*3/uL (ref 0.0–0.5)
Eosinophils Relative: 1 %
HEMATOCRIT: 30.6 % — AB (ref 34.8–46.6)
Hemoglobin: 10.4 g/dL — ABNORMAL LOW (ref 11.6–15.9)
Lymphocytes Relative: 11 %
Lymphs Abs: 0.9 10*3/uL (ref 0.9–3.3)
MCH: 30.4 pg (ref 25.1–34.0)
MCHC: 34 g/dL (ref 31.5–36.0)
MCV: 89.5 fL (ref 79.5–101.0)
MONO ABS: 0.6 10*3/uL (ref 0.1–0.9)
MONOS PCT: 7 %
NEUTROS ABS: 6.9 10*3/uL — AB (ref 1.5–6.5)
Neutrophils Relative %: 81 %
Platelets: 365 10*3/uL (ref 145–400)
RBC: 3.42 MIL/uL — ABNORMAL LOW (ref 3.70–5.45)
RDW: 12.7 % (ref 11.2–14.5)
WBC: 8.5 10*3/uL (ref 3.9–10.3)

## 2018-03-15 LAB — COMPREHENSIVE METABOLIC PANEL
ALBUMIN: 3.7 g/dL (ref 3.5–5.0)
ALT: <6 U/L (ref 0–44)
ANION GAP: 9 (ref 5–15)
AST: 21 U/L (ref 15–41)
Alkaline Phosphatase: 62 U/L (ref 38–126)
BILIRUBIN TOTAL: 0.4 mg/dL (ref 0.3–1.2)
BUN: 16 mg/dL (ref 8–23)
CO2: 31 mmol/L (ref 22–32)
Calcium: 9.9 mg/dL (ref 8.9–10.3)
Chloride: 100 mmol/L (ref 98–111)
Creatinine, Ser: 1.01 mg/dL — ABNORMAL HIGH (ref 0.44–1.00)
GFR calc Af Amer: >60 mL/min (ref >60)
GFR calc non Af Amer: 54 mL/min — ABNORMAL LOW (ref >60)
GLUCOSE: 89 mg/dL (ref 70–99)
POTASSIUM: 4.2 mmol/L (ref 3.5–5.1)
Sodium: 140 mmol/L (ref 135–145)
TOTAL PROTEIN: 7.4 g/dL (ref 6.5–8.1)

## 2018-03-15 LAB — MAGNESIUM: Magnesium: 1.9 mg/dL (ref 1.7–2.4)

## 2018-03-15 NOTE — Progress Notes (Signed)
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 She is prone to get recurrent urinary tract infection due to history of bladder injury and had repeated history of recurrent C. difficile My concern is risk of sepsis if she developed neutropenic fever in the setting of palliative chemotherapy In my opinion, the risk of treatment outweighs the benefits After a very prolonged discussion, she is in agreement to just focus on aggressive supportive care only including IV fluid support, IV antiemetics as tolerated and close blood count monitoring for now I plan to see her back in 2 weeks for further supportive care and I will consult home care service to perform weekly nursing assessment and administer IV fluid as needed  Peritoneal carcinomatosis ([place]) She has significant signs and symptoms of carcinomatosis with abdominal bloating, poor oral intake, early satiety and loss of appetite She is at risk of severe dehydration With a 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, I recommend IV fluid support at home and she agreed to proceed  Bladder injury, initial encounter She had history of bladder injury and is prone to get recurrent urinary tract infection We will observe closely for signs and symptoms of recurrent infection  Goals of care, counseling/discussion We had extensive discussion about goals of care The patient desired to preserve quality of life and to be treated with dignity She brought with her copies of her most current advanced directive and living  will She is in agreement for DO NOT RESUSCITATE We had extensive discussion about her poor prognosis and anticipated signs and symptoms of disease progression such as nausea, dehydration, progressive pain, anorexia and eventual death For now, she is in agreement to get enroll in home care service for IV fluid support at home along with nursing assessment I plan to see her every 2 weeks I have introduced her about the concept of palliative care/hospice and the patient is clearly not ready at this point in time We will also get connected to palliative service now and when time comes, if she gets weaker, we will transition her care to comfort measure and eventual hospice   Orders Placed This Encounter  Procedures  . Comprehensive metabolic panel    Standing Status:   Future    Number of Occurrences:   1    Standing Expiration Date:   04/19/2019  . CBC with Differential/Platelet    Standing Status:   Future    Number of Occurrences:   1    Standing Expiration Date:   04/19/2019  . Magnesium    Standing Status:   Future    Number of Occurrences:   1    Standing Expiration Date:   03/16/2019  . Ambulatory referral to Home Health    Referral Priority:   Routine    Referral Type:   Home Health Care    Referral Reason:   Specialty Services Required    Requested Specialty:   [place]    Number of Visits Requested:   1    INTERVAL HISTORY: Please see below for problem oriented charting. She returns for further follow-up  with her son and family members She gets progressively weak She feels bloated, nauseated, anorexic with loss of appetite and abdominal discomfort.  She continues to have mild bladder discomfort with urination No recent fever or chills  SUMMARY OF ONCOLOGIC HISTORY: Oncology History   Neg genetics ER 80%, PR 0% MSI stable     Peritoneal carcinomatosis ([place])   04/26/2017 Initial Diagnosis    Peritoneal carcinomatosis ([place])     Left ovarian epithelial  cancer ([place])   04/24/2017 Initial Diagnosis    She reports a gradual sensation of pelvic heaviness which she thought might be related to genital prolapse.  She presented for evaluation of an ultrasound was performed which demonstrated a uterus measuring 6 x 5 cm with a 1.3 mm endometrial stripe.  Multiple heterogeneous pelvic masses were appreciated the right measured 8 x 5 cm and another measured 10 cm just posterior to the uterus CA-125 was obtained and returned a value of 404.8. She also reports early satiety for approximately 2 months and a 10 pound weight loss over the last year.  She also reports mild abdominal bloating and sensation of pelvic heaviness    04/25/2017 Imaging    Ct abdomen and pelvis: 1. Appearance compatible with ovarian malignancy with widespread peritoneal, mesenteric, and omental metastatic disease. Specifically, a 12.7 cm primarily solid and partially cystic mass above the uterus probably arises from the right ovary and is associated with omental caking, complex ascites with peritoneal metastatic deposits, and mesenteric deposits of tumor. 2. Mild intrahepatic biliary dilatation. The extrahepatic biliary tree is within normal limits. 3.  Aortic Atherosclerosis (ICD10-I70.0).    05/01/2017 Procedure    Successful CT-guided left abdominal omental mass 18 gauge core biopsies    05/01/2017 Pathology Results    Soft Tissue Needle Core Biopsy, Left omental - METASTATIC CARCINOMA, SEE COMMENT. 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 She did not complete cycle 6 of chemo due to recurrent infection with C. difficile    05/15/2017 Procedure    Successful placement of a right internal jugular approach power injectable Port-A-Cath. The catheter is ready for immediate use.     06/25/2017 Tumor Marker    Patient's tumor was tested for the following markers: CA-125 Results of the tumor marker test revealed 64.5    07/09/2017 Imaging    Decreased size of cystic and solid central pelvic mass since previous study.  Near complete resolution of peritoneal metastatic disease and ascites since previous study.  No new or progressive metastatic disease identified. No evidence of metastatic disease within the thorax.  Large stool burden noted; recommend clinical correlation for possible constipation.    07/24/2017 Surgery    Operation: Robotic-assisted laparoscopic total hysterectomy with bilateral salpingoophorectomy, omentectomy, radical tumor debulking  Surgeon: [name]  Operative Findings:  : 3cm left omental/splenic flexure tumor nodule adherent to transverse colon. 10cm pelvic tumor from left ovary, adherent to uterus and rectum, necrotic. Left retroperitoneal infiltration.  Tumor rind remaining on posterior cul de sac and distal rectum - very thin, <1cm thick, representing optimal cytoreductive procedure.       07/24/2017 Pathology Results    1. Omentum, resection for tumor - METASTATIC CARCINOMA. 2. Uterus +/- tubes/ovaries, neoplastic - LEFT OVARY: HIGH GRADE SEROUS CARCINOMA, SPANNING APPROXIMATELY 9.9 CM. TUMOR INVOLVES SURFACE. TREATMENT EFFECT PRESENT. SEE ONCOLOGY  TABLE. - RIGHT OVARY: INVOLVED BY HIGH GRADE SEROUS CARCINOMA. - BILATERAL FALLOPIAN TUBES: INVOLVED BY HIGH GRADE SEROUS CARCINOMA. - UTERUS: -ENDOMETRIUM: ENDOMETRIAL TYPE POLYP. INACTIVE ENDOMETRIUM. NO HYPERPLASIA OR MALIGNANCY. -MYOMETRIUM: LEIOMYOMATA. NO MALIGNANCY. -SEROSA: INVOLVED BY HIGH GRADE SEROUS CARCINOMA. - CERVIX: HIGH GRADE SEROUS CARCINOMA PRESENT AT ANTERIOR CERVICAL SOFT TISSUE MARGIN. Microscopic Comment 2. OVARY Specimen(s): Uterus, cervix, bilateral ovaries and fallopian tubes, omentum. Procedure: (including lymph node sampling): Total hysterectomy  with bilateral salpingo-oophorectomy. Omentectomy. Primary tumor site (including laterality): Left ovary. Ovarian surface involvement: Present. Ovarian capsule intact without fragmentation: Disrupted. Maximum tumor size (cm): Estimated 9.9 cm. Histologic type: High grade serous carcinoma. Grade: High grade (FIGO N/A) Peritoneal implants: (specify invasive or non-invasive): Present (>2 cm) as in part #1. Pelvic extension (list additional structures on separate lines and if involved): Right ovary, bilateral fallopian tubes, cervical soft tissue. Lymph nodes: number examined 0 ; number positive 0 TNM code: ypT3c, ypNX, ypMX FIGO Stage (based on pathologic findings, needs clinical correlation): IIIC Comments: None.    07/24/2017 - 07/26/2017 Hospital Admission    She was admitted to the hospital for surgical debulking, complicated by anemia requiring blood transfusion.    07/29/2017 - 08/02/2017 Hospital Admission    She was admitted to the hospital for management of hematoma and bladder injury.    07/29/2017 Imaging    1. Blood products and large volume hemostatic material in the pelvis correlating with operative history. Sterility is indeterminate by imaging. No organized collection for drainage. 2. High-density posterior to the pelvic collection was present preoperatively and is attributed to mass calcification. No bowel leak is seen with oral contrast reaching the transverse colon. 3. Diffuse postoperative subcutaneous gas. Small hematomas at bilateral abdominal wall port sites. 4. Right nephrectomy.     07/31/2017 Procedure    Mild left hydronephrosis    08/01/2017 Imaging    Extravasation contrast from the posterior aspect of the bladder into the adjacent tissues    08/22/2017 Imaging    Persistent posterior left bladder wall leak near the midline, with contrast filling of a well-defined crescent-shaped extraluminal contrast collection posterior to the left bladder wall, and with  vaginal fistulization demonstrated at the inferior margin of this collection    08/22/2017 Tumor Marker    Patient's tumor was tested for the following markers: CA-125 Results of the tumor marker test revealed 13.5    09/04/2017 Tumor Marker    Patient's tumor was tested for the following markers: CA-125 Results of the tumor marker test revealed 12.5    09/25/2017 Tumor Marker    Patient's tumor was tested for the following markers: CA-125 Results of the tumor marker test revealed 11.3    09/28/2017 Imaging    1. Status post hysterectomy. No evidence of recurrent or metastatic disease. 2. Hyperattenuation posterior to the urinary bladder is likely the site of previously described contrast extravasation. No well-defined fluid collection or fistulous communication to the vagina identified. 3. Aortic Atherosclerosis (ICD10-I70.0). 8 mm splenic artery aneurysm. 4. Right nephrectomy.     10/17/2017 Genetic Testing    ATM c.8428A>C (p.Lys2810Gln) and AXIN2 c.1235A>G (p.Asn412Ser) VUS identified on the [employer] Myrisk panel.  The [facility] gene panel offered by [employer] includes sequencing and deletion/duplication testing of the following 28 genes: APC, ATM, BARD1, BMPR1A, BRCA1, BRCA2, BRIP1, CHD1, CDK4, CDKN2A, CHEK2, EPCAM (large rearrangement only), MLH1, MSH2, MSH6, MUTYH, NBN, PALB2, PMS2, PTEN, RAD51C, RAD51D, SMAD4, STK11, and TP53. Sequencing was performed for select regions of POLE and POLD1, and  large rearrangement analysis was performed for select regions of GREM1. The report date is October 15, 2017.  HRD testing was negative.  There were no BRCA mutations and the genomic instability was negative.  The report date is October 17, 2017.     10/31/2017 Tumor Marker    Patient's tumor was tested for the following markers: CA-125 Results of the tumor marker test revealed 9.9    12/12/2017 Procedure    Status post right IJ port catheter removal.    02/13/2018 Imaging    Increased  size of partially calcified pelvic mass within the hysterectomy bed.  New peritoneal metastatic disease and minimal ascites    02/26/2018 Tumor Marker    Patient's tumor was tested for the following markers: CA-125 Results of the tumor marker test revealed 184     REVIEW OF SYSTEMS:   Constitutional: Denies fevers, chills  Eyes: Denies blurriness of vision Ears, nose, mouth, throat, and face: Denies mucositis or sore throat Respiratory: Denies cough, dyspnea or wheezes Cardiovascular: Denies palpitation, chest discomfort or lower extremity swelling Skin: Denies abnormal skin rashes Lymphatics: Denies new lymphadenopathy or easy bruising Behavioral/Psych: Mood is stable, no new changes  All other systems were reviewed with the patient and are negative.  I have reviewed the past medical history, past surgical history, social history and family history with the patient and they are unchanged from previous note.  ALLERGIES:  has No Known Allergies.  MEDICATIONS:  Current Outpatient Medications  Medication Sig Dispense Refill  . acetaminophen (TYLENOL) 500 MG tablet Take 500 mg by mouth every 6 (six) hours as needed for mild pain or moderate pain.     Marland Kitchen saccharomyces boulardii (FLORASTOR) 250 MG capsule Take 1 capsule (250 mg total) by mouth 2 (two) times daily. 60 capsule 1   No current facility-administered medications for this visit.     PHYSICAL EXAMINATION: ECOG PERFORMANCE STATUS: 2 - Symptomatic, <50% confined to bed  Vitals:   03/15/18 1237  BP: 129/70  Pulse: 92  Resp: 17  Temp: 98.1 F (36.7 C)  SpO2: 100%   Filed Weights   03/15/18 1237  Weight: 96 lb 9.6 oz (43.8 kg)    GENERAL:alert, no distress and comfortable.  She looks thin and cachectic NEURO: alert & oriented x 3 with fluent speech, no focal motor/sensory deficits  LABORATORY DATA:  I have reviewed the data as listed    Component Value Date/Time   NA 140 03/15/2018 1347   NA 140 06/25/2017 1003    K 4.2 03/15/2018 1347   K 4.6 06/25/2017 1003   CL 100 03/15/2018 1347   CO2 31 03/15/2018 1347   CO2 29 06/25/2017 1003   GLUCOSE 89 03/15/2018 1347   GLUCOSE 94 06/25/2017 1003   GLUCOSE 81 05/18/2006 1112   BUN 16 03/15/2018 1347   BUN 14.8 06/25/2017 1003   CREATININE 1.01 (H) 03/15/2018 1347   CREATININE 0.9 06/25/2017 1003   CALCIUM 9.9 03/15/2018 1347   CALCIUM 9.5 06/25/2017 1003   PROT 7.4 03/15/2018 1347   PROT 7.0 06/25/2017 1003   ALBUMIN 3.7 03/15/2018 1347   ALBUMIN 3.7 06/25/2017 1003   AST 21 03/15/2018 1347   AST 17 06/25/2017 1003   ALT <6 03/15/2018 1347   ALT 11 06/25/2017 1003   ALKPHOS 62 03/15/2018 1347   ALKPHOS 88 06/25/2017 1003   BILITOT 0.4 03/15/2018 1347   BILITOT 0.22 06/25/2017 1003   GFRNONAA 54 (L) 03/15/2018 1347   GFRAA >60 03/15/2018 1347  No results found for: SPEP, UPEP  Lab Results  Component Value Date   WBC 8.5 03/15/2018   NEUTROABS 6.9 (H) 03/15/2018   HGB 10.4 (L) 03/15/2018   HCT 30.6 (L) 03/15/2018   MCV 89.5 03/15/2018   PLT 365 03/15/2018      Chemistry      Component Value Date/Time   NA 140 03/15/2018 1347   NA 140 06/25/2017 1003   K 4.2 03/15/2018 1347   K 4.6 06/25/2017 1003   CL 100 03/15/2018 1347   CO2 31 03/15/2018 1347   CO2 29 06/25/2017 1003   BUN 16 03/15/2018 1347   BUN 14.8 06/25/2017 1003   CREATININE 1.01 (H) 03/15/2018 1347   CREATININE 0.9 06/25/2017 1003      Component Value Date/Time   CALCIUM 9.9 03/15/2018 1347   CALCIUM 9.5 06/25/2017 1003   ALKPHOS 62 03/15/2018 1347   ALKPHOS 88 06/25/2017 1003   AST 21 03/15/2018 1347   AST 17 06/25/2017 1003   ALT <6 03/15/2018 1347   ALT 11 06/25/2017 1003   BILITOT 0.4 03/15/2018 1347   BILITOT 0.22 06/25/2017 1003      All questions were answered. The patient knows to call the clinic with any problems, questions or concerns. No barriers to learning was detected.  I spent 60 minutes counseling the patient face to face. The total  time spent in the appointment was 80 minutes and more than 50% was on counseling and review of test results  Heath Lark, MD 03/15/2018 4:34 PM

## 2018-03-15 NOTE — Assessment & Plan Note (Signed)
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

## 2018-03-15 NOTE — Assessment & Plan Note (Signed)
We had extensive discussion about goals of care The patient desired to preserve quality of life and to be treated with dignity She brought with her copies of her most current advanced directive and living will She is in agreement for DO NOT RESUSCITATE We had extensive discussion about her poor prognosis and anticipated signs and symptoms of disease progression such as nausea, dehydration, progressive pain, anorexia and eventual death For now, she is in agreement to get enroll in home care service for IV fluid support at home along with nursing assessment I plan to see her every 2 weeks I have introduced her about the concept of palliative care/hospice and the patient is clearly not ready at this point in time We will also get connected to palliative service now and when time comes, if she gets weaker, we will transition her care to comfort measure and eventual hospice

## 2018-03-15 NOTE — Telephone Encounter (Signed)
Gave patient avs report and appointments for September. Patient sent back to lab.

## 2018-03-15 NOTE — Assessment & Plan Note (Signed)
She had history of bladder injury and is prone to get recurrent urinary tract infection We will observe closely for signs and symptoms of recurrent infection

## 2018-03-15 NOTE — Assessment & Plan Note (Signed)
I have reviewed recommendation from [facility] We had extensive discussion about the risk and benefits of palliative chemotherapy, in the setting of platinum refractory ovarian cancer The patient is very frail due to her comorbidities and risk of recurrent infection. She is prone to get recurrent urinary tract infection due to history of bladder injury and had repeated history of recurrent C. difficile My concern is risk of sepsis if she developed neutropenic fever in the setting of palliative chemotherapy In my opinion, the risk of treatment outweighs the benefits After a very prolonged discussion, she is in agreement to just focus on aggressive supportive care only including IV fluid support, IV antiemetics as tolerated and close blood count monitoring for now I plan to see her back in 2 weeks for further supportive care and I will consult home care service to perform weekly nursing assessment and administer IV fluid as needed

## 2018-03-18 ENCOUNTER — Telehealth: Payer: Self-pay

## 2018-03-18 NOTE — Telephone Encounter (Signed)
Called and given below message. She verbalized understanding. 

## 2018-03-18 NOTE — Telephone Encounter (Signed)
-----   Message from Heath Lark, MD sent at 03/15/2018  4:26 PM EDT ----- Regarding: non-urgent call Labs are good

## 2018-03-25 ENCOUNTER — Telehealth: Payer: Self-pay

## 2018-03-25 ENCOUNTER — Other Ambulatory Visit: Payer: Self-pay

## 2018-03-25 ENCOUNTER — Encounter: Payer: Self-pay | Admitting: Hematology and Oncology

## 2018-03-25 DIAGNOSIS — C786 Secondary malignant neoplasm of retroperitoneum and peritoneum: Secondary | ICD-10-CM

## 2018-03-25 DIAGNOSIS — C801 Malignant (primary) neoplasm, unspecified: Principal | ICD-10-CM

## 2018-03-25 NOTE — Telephone Encounter (Signed)
Pt sent MyChart message stating she has increasing abdominal swelling that has spread to her sides and back.  Per Dr Alvy Bimler pt needs paracentesis. Pt has been set up for US guided paracentesis on 9/26 at 10:00.  Pt instructed that if she starts to become short of breath prior to this appointment to let us know so we can get her in sooner.  Pt verbalizes understanding of instructions.

## 2018-03-27 ENCOUNTER — Telehealth: Payer: Self-pay

## 2018-03-27 IMAGING — RF DG CYSTOGRAM 3+V
12 of 19 series · 15 of 24 positions shown · non-contrast
Comparison: 08/01/2017 cystogram

CLINICAL DATA: Status post TAH-BSO 07/24/2017 for ovarian cancer.
Patient presents for follow-up of posterior bladder leak. Interval
bladder decompression with Foley catheter.

EXAM:
CYSTOGRAM
TECHNIQUE: After catheterization of the urinary bladder following sterile
technique the bladder was filled with 230 mL Cysto-Hypaque 30% by
drip infusion. Serial spot images were obtained during bladder
filling and post draining.
FLUOROSCOPY TIME:  Fluoroscopy Time:  3 minutes 5 seconds
Radiation Exposure Index (if provided by the fluoroscopic device):
102.4 mGy
Number of Acquired Spot Images: 18

[Series 1: t abdomen supine · 0.15mm/px · 1 of 1 slices shown (1 of 2)]
[im 1/1]
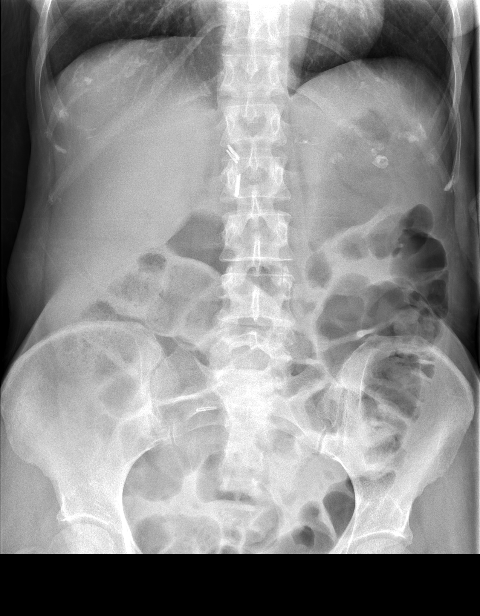

[Series 3: fluoro_iodine 2fps_bw · 0.20mm/px · 1 of 1 slices shown (1 of 8)]
[im 1/1]
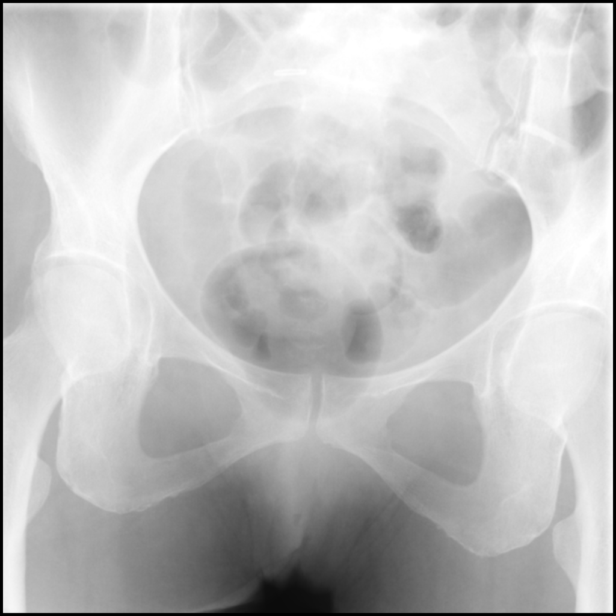

[Series 6: cp_standard · 0.40mm/px · 3 of 118 frames shown (1 of 2)]
[frame 18/118]
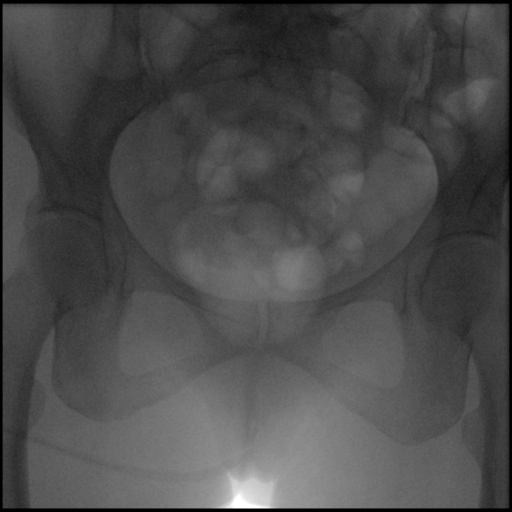
[frame 21/118]
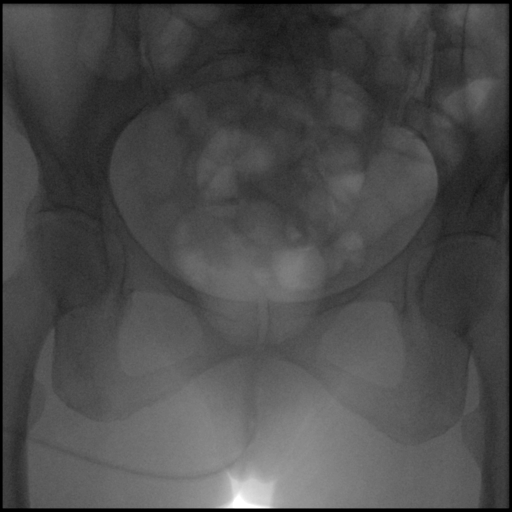
[frame 101/118]
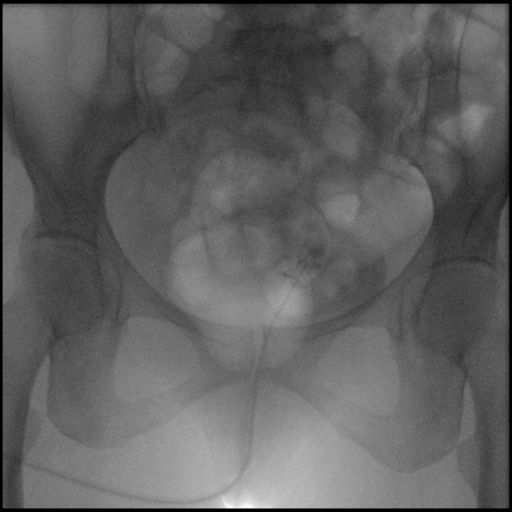

[Series 7: fluoro_iodine 2fps_bw · 0.20mm/px · 1 of 1 slices shown (2 of 8)]
[im 1/1]
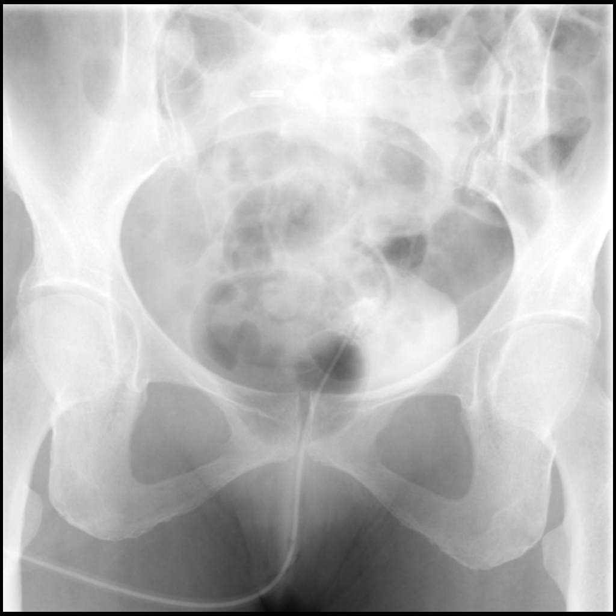

[Series 9: fluoro_iodine 2fps_bw · 0.20mm/px · 1 of 1 slices shown (3 of 8)]
[im 1/1]
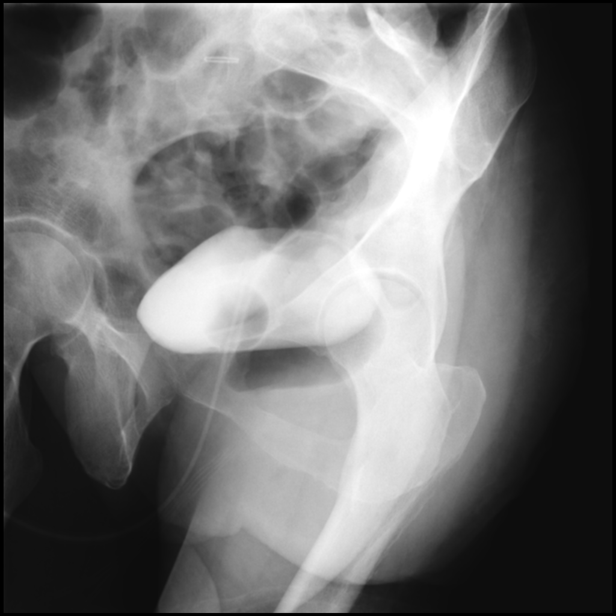

[Series 12: fluoro_iodine 2fps_bw · 0.20mm/px · 1 of 1 slices shown (4 of 8)]
[im 1/1]
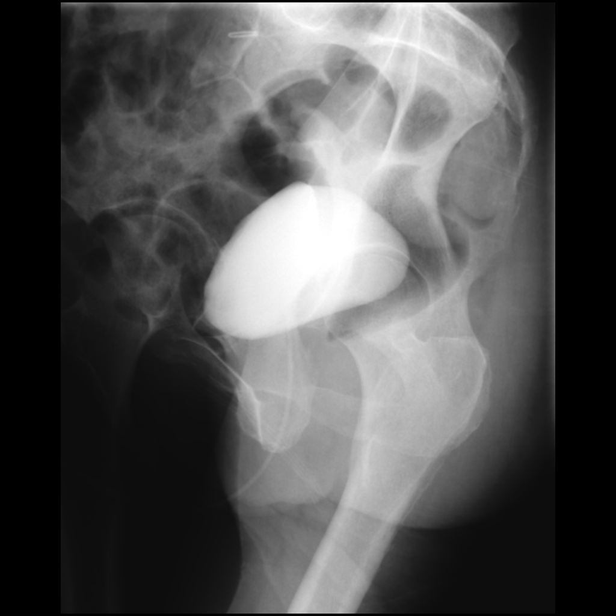

[Series 13: fluoro_iodine 2fps_bw · 0.20mm/px · 1 of 1 slices shown (5 of 8)]
[im 1/1]
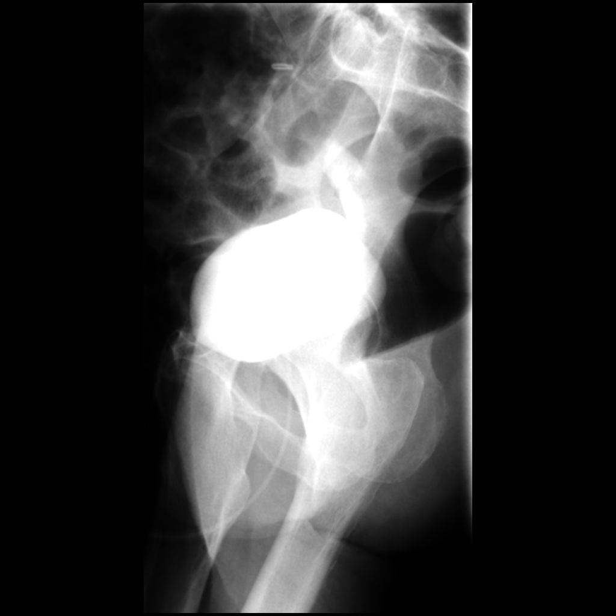

[Series 15: fluoro_iodine 2fps_bw · 0.20mm/px · 1 of 1 slices shown (6 of 8)]
[im 1/1]
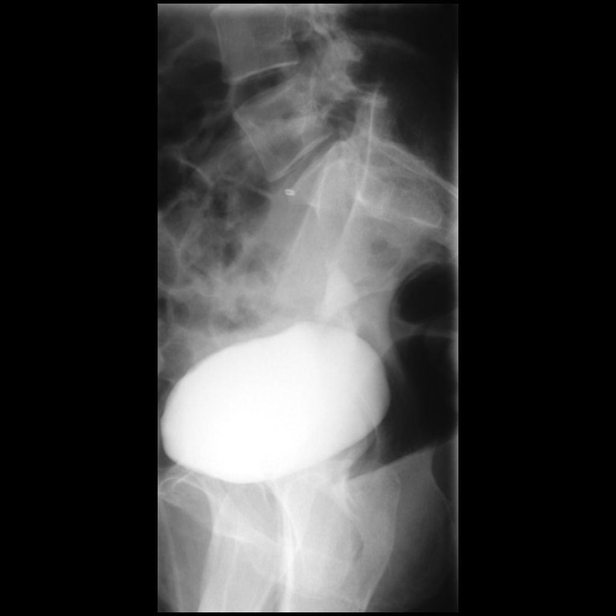

[Series 16: fluoro_iodine 2fps_bw · 0.20mm/px · 1 of 1 slices shown (7 of 8)]
[im 1/1]
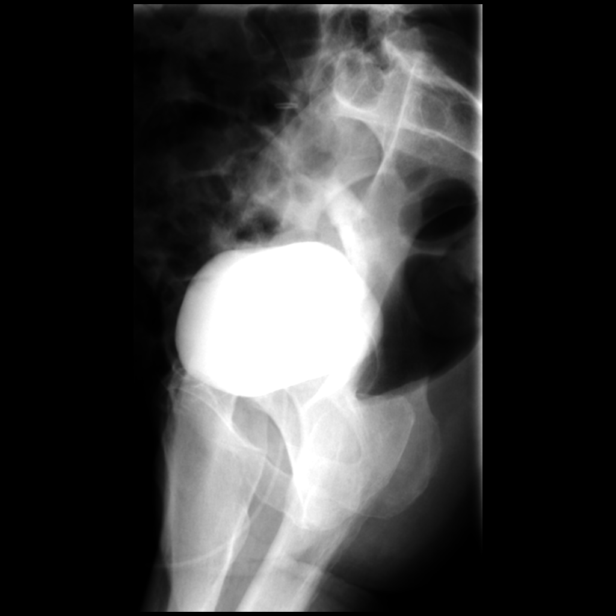

[Series 18: cp_standard · 0.40mm/px · 2 of 151 frames shown (2 of 2)]
[frame 23/151]
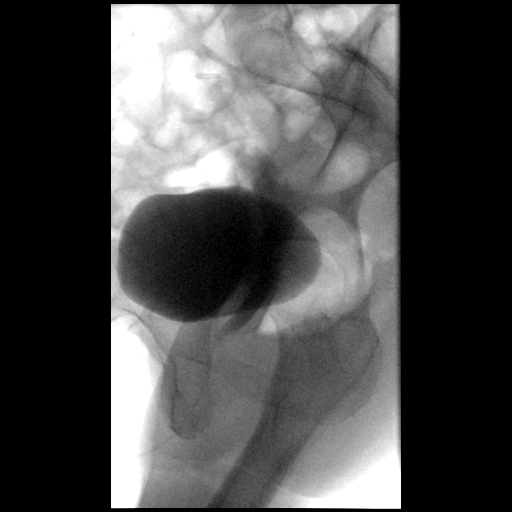
[frame 129/151]
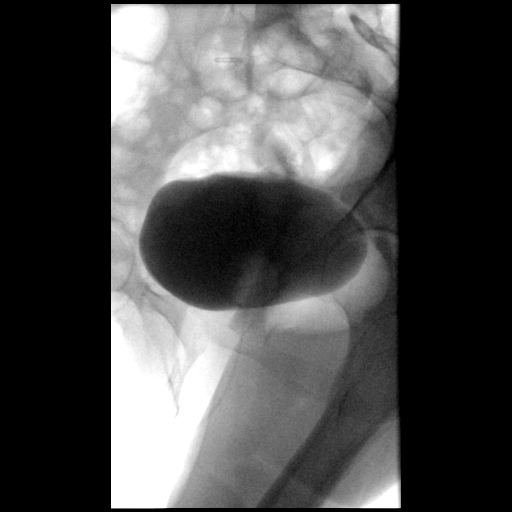

[Series 19: fluoro_iodine 2fps_bw · 0.20mm/px · 1 of 1 slices shown (8 of 8)]
[im 1/1]
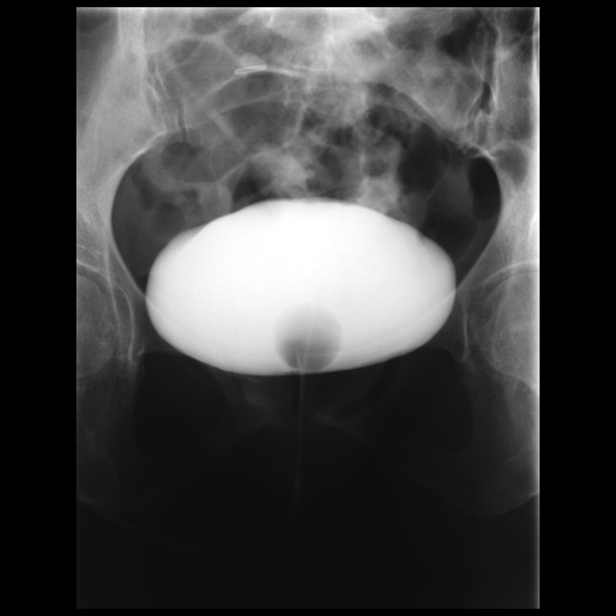

[Series 21: t abdomen supine · 0.15mm/px · 1 of 1 slices shown (2 of 2)]
[im 1/1]
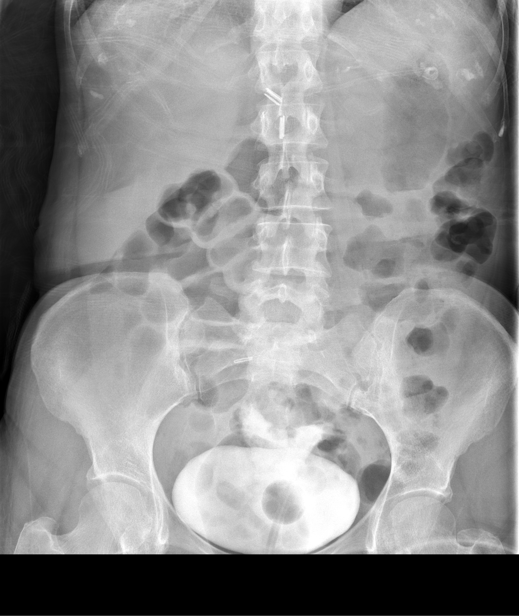

[15 of 24 positions shown; findings below may reference images not displayed]

FINDINGS: Scout radiograph demonstrates surgical clips overlying the lumbar
spine and right sacrum. No dilated small bowel loops. Mild colonic
stool volume. No evidence of pneumatosis or pneumoperitoneum.

Early filling views of the bladder demonstrate well-positioned Foley
catheter within the bladder. No additional filling defects in the
urinary bladder. After instillation of approximately 100-150 cc
contrast into the bladder, a bladder leak was re-demonstrated at the
left posterior bladder wall near the midline, with a well-defined
crescent-shaped extraluminal contrast collection posterior to the
left bladder wall measuring approximately 6 cm in width, 8 cm in
craniocaudal dimensions and 3 cm in AP dimensions. There is
fistulization at the inferior margin of this well-defined
crescent-shaped pelvic collection to the left vaginal cuff region,
with concomitant leakage of fluid to the vaginal vault reported by
the patient.

Postvoid radiograph demonstrates persistent contrast within the
posterior pelvic collection, with partial emptying of the bladder.
IMPRESSION: Persistent posterior left bladder wall leak near the midline, with
contrast filling of a well-defined crescent-shaped extraluminal
contrast collection posterior to the left bladder wall, and with
vaginal fistulization demonstrated at the inferior margin of this
collection.

These results were called by telephone at the time of interpretation
on 08/22/2017 at [DATE] to Dr. NORHAZIRAH SANDAI, who verbally
acknowledged these results.

## 2018-03-27 NOTE — Telephone Encounter (Signed)
Called regarding my chart message. Message given to Dr. Alvy Bimler. Called radiology scheduling and they are unable to move appt to today. Instructed per Dr. Alvy Bimler, to go to ER if she is miserable. She said she feels a little better today with the indigestion and is able to drink. If she feels worse she said she will go to ER. Instructed to call office if needed. She verbalized understanding.

## 2018-03-28 ENCOUNTER — Ambulatory Visit (HOSPITAL_COMMUNITY)
Admission: RE | Admit: 2018-03-28 | Discharge: 2018-03-28 | Disposition: A | Discharge status: Home / Self Care | Payer: Medicare Other | Source: Ambulatory Visit | Attending: Hematology and Oncology | Admitting: Hematology and Oncology

## 2018-03-28 ENCOUNTER — Telehealth: Payer: Self-pay

## 2018-03-28 DIAGNOSIS — C801 Malignant (primary) neoplasm, unspecified: Secondary | ICD-10-CM | POA: Insufficient documentation

## 2018-03-28 DIAGNOSIS — C786 Secondary malignant neoplasm of retroperitoneum and peritoneum: Secondary | ICD-10-CM | POA: Insufficient documentation

## 2018-03-28 DIAGNOSIS — R180 Malignant ascites: Secondary | ICD-10-CM | POA: Insufficient documentation

## 2018-03-28 MED ORDER — LIDOCAINE HCL 1 % IJ SOLN
INTRAMUSCULAR | Status: AC
  Filled 2018-03-28: qty 20

## 2018-03-28 NOTE — Telephone Encounter (Signed)
Called per Dr. Alvy Bimler, to follow up with how she is doing after paracentesis today. She doing good and feels better after the paracentesis, her heartburn has gone away. She is getting ready to eat lunch. She feels like she will be able to eat and drink now.  She wants to hold off on getting IV fluids for now.  Notified Pam with [facility] regarding IV fluids.

## 2018-03-28 NOTE — Procedures (Signed)
PROCEDURE SUMMARY:  Successful image-guided paracentesis from the left lower abdomen.  Yielded 3.1 liters of blood tinged fluid.  No immediate complications.  Patient tolerated well.   Specimen was not sent for labs.  Claris Pong Shawnell Dykes PA-C 03/28/2018 10:16 AM

## 2018-03-29 ENCOUNTER — Other Ambulatory Visit: Payer: Self-pay

## 2018-03-29 DIAGNOSIS — C801 Malignant (primary) neoplasm, unspecified: Principal | ICD-10-CM

## 2018-03-29 DIAGNOSIS — C786 Secondary malignant neoplasm of retroperitoneum and peritoneum: Secondary | ICD-10-CM

## 2018-04-01 ENCOUNTER — Inpatient Hospital Stay (HOSPITAL_BASED_OUTPATIENT_CLINIC_OR_DEPARTMENT_OTHER): Payer: Medicare Other | Admitting: Hematology and Oncology

## 2018-04-01 ENCOUNTER — Inpatient Hospital Stay: Payer: Medicare Other

## 2018-04-01 DIAGNOSIS — R180 Malignant ascites: Secondary | ICD-10-CM | POA: Diagnosis not present

## 2018-04-01 DIAGNOSIS — R64 Cachexia: Secondary | ICD-10-CM | POA: Diagnosis not present

## 2018-04-01 DIAGNOSIS — C786 Secondary malignant neoplasm of retroperitoneum and peritoneum: Secondary | ICD-10-CM

## 2018-04-01 DIAGNOSIS — Z7189 Other specified counseling: Secondary | ICD-10-CM

## 2018-04-01 DIAGNOSIS — C562 Malignant neoplasm of left ovary: Secondary | ICD-10-CM

## 2018-04-01 DIAGNOSIS — C801 Malignant (primary) neoplasm, unspecified: Principal | ICD-10-CM

## 2018-04-01 LAB — CBC WITH DIFFERENTIAL (CANCER CENTER ONLY)
BASOS ABS: 0.1 10*3/uL (ref 0.0–0.1)
BASOS PCT: 1 %
Eosinophils Absolute: 0 10*3/uL (ref 0.0–0.5)
Eosinophils Relative: 0 %
HEMATOCRIT: 29.4 % — AB (ref 34.8–46.6)
Hemoglobin: 10.1 g/dL — ABNORMAL LOW (ref 11.6–15.9)
LYMPHS PCT: 7 %
Lymphs Abs: 0.7 10*3/uL — ABNORMAL LOW (ref 0.9–3.3)
MCH: 29.7 pg (ref 25.1–34.0)
MCHC: 34.2 g/dL (ref 31.5–36.0)
MCV: 86.9 fL (ref 79.5–101.0)
Monocytes Absolute: 0.8 10*3/uL (ref 0.1–0.9)
Monocytes Relative: 9 %
NEUTROS ABS: 7.6 10*3/uL — AB (ref 1.5–6.5)
Neutrophils Relative %: 83 %
PLATELETS: 613 10*3/uL — AB (ref 145–400)
RBC: 3.39 MIL/uL — AB (ref 3.70–5.45)
RDW: 12.6 % (ref 11.2–14.5)
WBC: 9.2 10*3/uL (ref 3.9–10.3)

## 2018-04-01 LAB — CMP (CANCER CENTER ONLY)
ALBUMIN: 2.4 g/dL — AB (ref 3.5–5.0)
ALT: 9 U/L (ref 0–44)
ANION GAP: 10 (ref 5–15)
AST: 21 U/L (ref 15–41)
Alkaline Phosphatase: 56 U/L (ref 38–126)
BILIRUBIN TOTAL: 0.3 mg/dL (ref 0.3–1.2)
BUN: 21 mg/dL (ref 8–23)
CALCIUM: 9.2 mg/dL (ref 8.9–10.3)
CO2: 29 mmol/L (ref 22–32)
Chloride: 101 mmol/L (ref 98–111)
Creatinine: 1.09 mg/dL — ABNORMAL HIGH (ref 0.44–1.00)
GFR, EST AFRICAN AMERICAN: 57 mL/min — AB (ref >60)
GFR, EST NON AFRICAN AMERICAN: 49 mL/min — AB (ref >60)
Glucose, Bld: 88 mg/dL (ref 70–99)
POTASSIUM: 4.5 mmol/L (ref 3.5–5.1)
Sodium: 140 mmol/L (ref 135–145)
TOTAL PROTEIN: 6.6 g/dL (ref 6.5–8.1)

## 2018-04-02 ENCOUNTER — Encounter: Payer: Self-pay | Admitting: Hematology and Oncology

## 2018-04-02 NOTE — Assessment & Plan Note (Signed)
She has significant malignant cachexia We discussed oral intake as tolerated along with nutritional supplement

## 2018-04-02 NOTE — Assessment & Plan Note (Signed)
She had recurrent malignant ascites She does not need paracentesis yet and she will call us if she felt her abdomen is getting progressively worse and causing discomfort

## 2018-04-02 NOTE — Assessment & Plan Note (Signed)
Unfortunately, the patient have signs of rapid disease progression with progressive malignant cachexia, protein calorie malnutrition, recurrent ascites and weakness Previously, we had extensive discussion to focus on supportive care only She is currently getting advanced home care service to monitor her symptoms We have discussed referral to palliative care and hospice I do not plan to see her back in the future unless needed

## 2018-04-02 NOTE — Assessment & Plan Note (Signed)
We had extensive goals of care discussion We discussed prognosis Without treatment, I anticipate prognosis will be less than 6 months

## 2018-04-02 NOTE — Progress Notes (Signed)
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[facility]) Unfortunately, the patient have signs of rapid disease progression with progressive malignant cachexia, protein calorie malnutrition, recurrent ascites and weakness Previously, we had extensive discussion to focus on supportive care only She is currently getting advanced home care service to monitor her symptoms We have discussed referral to palliative care and hospice I do not plan to see her back in the future unless needed  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   INTERVAL HISTORY: Please see below for problem oriented charting. She returns with family members for further follow-up She did not feel significantly better since paracentesis although she can breathe better She has lost some weight Her oral intake is very poor She is concerned about receiving IV fluids due to potential worsening ascites She drinks and eat very little She denies significant abdominal pain, nausea or signs or symptoms of bowel obstruction  SUMMARY OF ONCOLOGIC HISTORY: Oncology History   Neg genetics ER 80%, PR 0% MSI stable     Peritoneal carcinomatosis ([place])   04/26/2017 Initial Diagnosis    Peritoneal carcinomatosis (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  She presented for evaluation of an ultrasound was performed which demonstrated a uterus measuring 6 x 5 cm with a 1.3 mm endometrial stripe.  Multiple heterogeneous pelvic masses were appreciated the right measured 8 x 5 cm and another measured 10 cm just posterior to the uterus CA-125 was obtained and returned a value of 404.8. She also reports early satiety for approximately 2 months and a 10 pound weight loss over the last year.  She also reports mild abdominal bloating and sensation of pelvic heaviness    04/25/2017 Imaging    Ct abdomen and pelvis: 1. Appearance compatible with ovarian malignancy with widespread peritoneal, mesenteric, and omental metastatic disease. Specifically, a 12.7 cm primarily solid and partially cystic mass above the uterus probably arises from the right ovary and is associated with omental caking, complex ascites with peritoneal metastatic deposits, and mesenteric deposits of tumor. 2. Mild intrahepatic biliary dilatation. The extrahepatic biliary tree is within normal limits. 3.  Aortic Atherosclerosis (ICD10-I70.0).    05/01/2017 Procedure    Successful CT-guided left abdominal omental mass 18 gauge core biopsies    05/01/2017 Pathology Results    Soft Tissue Needle Core Biopsy, Left omental - METASTATIC CARCINOMA, SEE COMMENT. 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 She did not complete cycle 6 of chemo due to recurrent infection with C. difficile    05/15/2017 Procedure    Successful placement of  a right internal jugular approach power injectable Port-A-Cath. The catheter is ready for immediate use.    06/25/2017 Tumor Marker     Patient's tumor was tested for the following markers: CA-125 Results of the tumor marker test revealed 64.5    07/09/2017 Imaging    Decreased size of cystic and solid central pelvic mass since previous study.  Near complete resolution of peritoneal metastatic disease and ascites since previous study.  No new or progressive metastatic disease identified. No evidence of metastatic disease within the thorax.  Large stool burden noted; recommend clinical correlation for possible constipation.    07/24/2017 Surgery    Operation: Robotic-assisted laparoscopic total hysterectomy with bilateral salpingoophorectomy, omentectomy, radical tumor debulking  Surgeon: [name]  Operative Findings:  : 3cm left omental/splenic flexure tumor nodule adherent to transverse colon. 10cm pelvic tumor from left ovary, adherent to uterus and rectum, necrotic. Left retroperitoneal infiltration.  Tumor rind remaining on posterior cul de sac and distal rectum - very thin, <1cm thick, representing optimal cytoreductive procedure.       07/24/2017 Pathology Results    1. Omentum, resection for tumor - METASTATIC CARCINOMA. 2. Uterus +/- tubes/ovaries, neoplastic - LEFT OVARY: HIGH GRADE SEROUS CARCINOMA, SPANNING APPROXIMATELY 9.9 CM. TUMOR INVOLVES SURFACE. TREATMENT EFFECT PRESENT. SEE ONCOLOGY TABLE. - RIGHT OVARY: INVOLVED BY HIGH GRADE SEROUS CARCINOMA. - BILATERAL FALLOPIAN TUBES: INVOLVED BY HIGH GRADE SEROUS CARCINOMA. - UTERUS: -ENDOMETRIUM: ENDOMETRIAL TYPE POLYP. INACTIVE ENDOMETRIUM. NO HYPERPLASIA OR MALIGNANCY. -MYOMETRIUM: LEIOMYOMATA. NO MALIGNANCY. -SEROSA: INVOLVED BY HIGH GRADE SEROUS CARCINOMA. - CERVIX: HIGH GRADE SEROUS CARCINOMA PRESENT AT ANTERIOR CERVICAL SOFT TISSUE MARGIN. Microscopic Comment 2. OVARY Specimen(s): Uterus, cervix, bilateral ovaries and fallopian tubes, omentum. Procedure: (including lymph node sampling): Total hysterectomy with bilateral  salpingo-oophorectomy. Omentectomy. Primary tumor site (including laterality): Left ovary. Ovarian surface involvement: Present. Ovarian capsule intact without fragmentation: Disrupted. Maximum tumor size (cm): Estimated 9.9 cm. Histologic type: High grade serous carcinoma. Grade: High grade (FIGO N/A) Peritoneal implants: (specify invasive or non-invasive): Present (>2 cm) as in part #1. Pelvic extension (list additional structures on separate lines and if involved): Right ovary, bilateral fallopian tubes, cervical soft tissue. Lymph nodes: number examined 0 ; number positive 0 TNM code: ypT3c, ypNX, ypMX FIGO Stage (based on pathologic findings, needs clinical correlation): IIIC Comments: None.    07/24/2017 - 07/26/2017 Hospital Admission    She was admitted to the hospital for surgical debulking, complicated by anemia requiring blood transfusion.    07/29/2017 - 08/02/2017 Hospital Admission    She was admitted to the hospital for management of hematoma and bladder injury.    07/29/2017 Imaging    1. Blood products and large volume hemostatic material in the pelvis correlating with operative history. Sterility is indeterminate by imaging. No organized collection for drainage. 2. High-density posterior to the pelvic collection was present preoperatively and is attributed to mass calcification. No bowel leak is seen with oral contrast reaching the transverse colon. 3. Diffuse postoperative subcutaneous gas. Small hematomas at bilateral abdominal wall port sites. 4. Right nephrectomy.     07/31/2017 Procedure    Mild left hydronephrosis    08/01/2017 Imaging    Extravasation contrast from the posterior aspect of the bladder into the adjacent tissues    08/22/2017 Imaging    Persistent posterior left bladder wall leak near the midline, with contrast filling of a well-defined crescent-shaped extraluminal contrast collection posterior to the left bladder wall, and with vaginal  fistulization demonstrated at the inferior margin of  this collection    08/22/2017 Tumor Marker    Patient's tumor was tested for the following markers: CA-125 Results of the tumor marker test revealed 13.5    09/04/2017 Tumor Marker    Patient's tumor was tested for the following markers: CA-125 Results of the tumor marker test revealed 12.5    09/25/2017 Tumor Marker    Patient's tumor was tested for the following markers: CA-125 Results of the tumor marker test revealed 11.3    09/28/2017 Imaging    1. Status post hysterectomy. No evidence of recurrent or metastatic disease. 2. Hyperattenuation posterior to the urinary bladder is likely the site of previously described contrast extravasation. No well-defined fluid collection or fistulous communication to the vagina identified. 3. Aortic Atherosclerosis (ICD10-I70.0). 8 mm splenic artery aneurysm. 4. Right nephrectomy.     10/17/2017 Genetic Testing    ATM c.8428A>C (p.Lys2810Gln) and AXIN2 c.1235A>G (p.Asn412Ser) VUS identified on the [employer] Myrisk panel.  The [facility] gene panel offered by [employer] includes sequencing and deletion/duplication testing of the following 28 genes: APC, ATM, BARD1, BMPR1A, BRCA1, BRCA2, BRIP1, CHD1, CDK4, CDKN2A, CHEK2, EPCAM (large rearrangement only), MLH1, MSH2, MSH6, MUTYH, NBN, PALB2, PMS2, PTEN, RAD51C, RAD51D, SMAD4, STK11, and TP53. Sequencing was performed for select regions of POLE and POLD1, and large rearrangement analysis was performed for select regions of GREM1. The report date is October 15, 2017.  HRD testing was negative.  There were no BRCA mutations and the genomic instability was negative.  The report date is October 17, 2017.     10/31/2017 Tumor Marker    Patient's tumor was tested for the following markers: CA-125 Results of the tumor marker test revealed 9.9    12/12/2017 Procedure    Status post right IJ port catheter removal.    02/13/2018 Imaging    Increased size of  partially calcified pelvic mass within the hysterectomy bed.  New peritoneal metastatic disease and minimal ascites    02/26/2018 Tumor Marker    Patient's tumor was tested for the following markers: CA-125 Results of the tumor marker test revealed 184    03/28/2018 Procedure    Successful ultrasound-guided paracentesis yielding 3.1 L of peritoneal fluid.     REVIEW OF SYSTEMS:   Constitutional: Denies fevers, chills  Eyes: Denies blurriness of vision Ears, nose, mouth, throat, and face: Denies mucositis or sore throat Respiratory: Denies cough, dyspnea or wheezes Cardiovascular: Denies palpitation, chest discomfort or lower extremity swelling Gastrointestinal:  Denies nausea, heartburn or change in bowel habits Skin: Denies abnormal skin rashes Lymphatics: Denies new lymphadenopathy or easy bruising Behavioral/Psych: Mood is stable, no new changes  All other systems were reviewed with the patient and are negative.  I have reviewed the past medical history, past surgical history, social history and family history with the patient and they are unchanged from previous note.  ALLERGIES:  has No Known Allergies.  MEDICATIONS:  Current Outpatient Medications  Medication Sig Dispense Refill  . acetaminophen (TYLENOL) 500 MG tablet Take 500 mg by mouth every 6 (six) hours as needed for mild pain or moderate pain.     Marland Kitchen saccharomyces boulardii (FLORASTOR) 250 MG capsule Take 1 capsule (250 mg total) by mouth 2 (two) times daily. 60 capsule 1   No current facility-administered medications for this visit.     PHYSICAL EXAMINATION: ECOG PERFORMANCE STATUS: 2 - Symptomatic, <50% confined to bed  Vitals:   04/01/18 0819  BP: 120/66  Pulse: (!) 105  Resp: 18  Temp:  98 F (36.7 C)  SpO2: 100%   Filed Weights   04/01/18 0819  Weight: 93 lb (42.2 kg)    GENERAL:alert, no distress and comfortable.  She looks thin and cachectic ABDOMEN: Obvious abdominal distention is  noted Musculoskeletal:no cyanosis of digits and no clubbing  NEURO: alert & oriented x 3 with fluent speech, no focal motor/sensory deficits  LABORATORY DATA:  I have reviewed the data as listed    Component Value Date/Time   NA 140 04/01/2018 0808   NA 140 06/25/2017 1003   K 4.5 04/01/2018 0808   K 4.6 06/25/2017 1003   CL 101 04/01/2018 0808   CO2 29 04/01/2018 0808   CO2 29 06/25/2017 1003   GLUCOSE 88 04/01/2018 0808   GLUCOSE 94 06/25/2017 1003   GLUCOSE 81 05/18/2006 1112   BUN 21 04/01/2018 0808   BUN 14.8 06/25/2017 1003   CREATININE 1.09 (H) 04/01/2018 0808   CREATININE 0.9 06/25/2017 1003   CALCIUM 9.2 04/01/2018 0808   CALCIUM 9.5 06/25/2017 1003   PROT 6.6 04/01/2018 0808   PROT 7.0 06/25/2017 1003   ALBUMIN 2.4 (L) 04/01/2018 0808   ALBUMIN 3.7 06/25/2017 1003   AST 21 04/01/2018 0808   AST 17 06/25/2017 1003   ALT 9 04/01/2018 0808   ALT 11 06/25/2017 1003   ALKPHOS 56 04/01/2018 0808   ALKPHOS 88 06/25/2017 1003   BILITOT 0.3 04/01/2018 0808   BILITOT 0.22 06/25/2017 1003   GFRNONAA 49 (L) 04/01/2018 0808   GFRAA 57 (L) 04/01/2018 0808    No results found for: SPEP, UPEP  Lab Results  Component Value Date   WBC 9.2 04/01/2018   NEUTROABS 7.6 (H) 04/01/2018   HGB 10.1 (L) 04/01/2018   HCT 29.4 (L) 04/01/2018   MCV 86.9 04/01/2018   PLT 613 (H) 04/01/2018      Chemistry      Component Value Date/Time   NA 140 04/01/2018 0808   NA 140 06/25/2017 1003   K 4.5 04/01/2018 0808   K 4.6 06/25/2017 1003   CL 101 04/01/2018 0808   CO2 29 04/01/2018 0808   CO2 29 06/25/2017 1003   BUN 21 04/01/2018 0808   BUN 14.8 06/25/2017 1003   CREATININE 1.09 (H) 04/01/2018 0808   CREATININE 0.9 06/25/2017 1003      Component Value Date/Time   CALCIUM 9.2 04/01/2018 0808   CALCIUM 9.5 06/25/2017 1003   ALKPHOS 56 04/01/2018 0808   ALKPHOS 88 06/25/2017 1003   AST 21 04/01/2018 0808   AST 17 06/25/2017 1003   ALT 9 04/01/2018 0808   ALT 11 06/25/2017  1003   BILITOT 0.3 04/01/2018 0808   BILITOT 0.22 06/25/2017 1003       RADIOGRAPHIC STUDIES: I have personally reviewed the radiological images as listed and agreed with the findings in the report. US Paracentesis  Result Date: 03/28/2018 INDICATION: Patient with history of left ovarian epithelial cancer and peritoneal carcinomatosis. Is made for therapeutic paracentesis. EXAM: ULTRASOUND GUIDED THERAPEUTIC PARACENTESIS MEDICATIONS: 10 mL of 1% lidocaine COMPLICATIONS: None immediate. PROCEDURE: Informed written consent was obtained from the patient after a discussion of the risks, benefits and alternatives to treatment. A timeout was performed prior to the initiation of the procedure. Initial ultrasound scanning demonstrates a large amount of ascites within the left lower abdominal quadrant. The left lower abdomen was prepped and draped in the usual sterile fashion. 1% lidocaine was used for local anesthesia. Following this, a 19 gauge, 7-cm, Yueh catheter  was introduced. An ultrasound image was saved for documentation purposes. The paracentesis was performed. The catheter was removed and a dressing was applied. The patient tolerated the procedure well without immediate post procedural complication. FINDINGS: A total of approximately 3.1 L of blood-tinged fluid was removed. IMPRESSION: Successful ultrasound-guided paracentesis yielding 3.1 L of peritoneal fluid. Read by: Earley Abide, PA-C Electronically Signed   By: Jacqulynn Cadet M.D.   On: 03/28/2018 11:15    All questions were answered. The patient knows to call the clinic with any problems, questions or concerns. No barriers to learning was detected.  I spent 15 minutes counseling the patient face to face. The total time spent in the appointment was 20 minutes and more than 50% was on counseling and review of test results  Heath Lark, MD 04/02/2018 2:20 PM

## 2018-04-04 ENCOUNTER — Telehealth: Payer: Self-pay

## 2018-04-04 ENCOUNTER — Encounter: Payer: Self-pay | Admitting: Hematology and Oncology

## 2018-04-04 NOTE — Telephone Encounter (Signed)
Called and given below message. She verbalized understanding. 

## 2018-04-04 NOTE — Telephone Encounter (Signed)
-----   Message from Heath Lark, MD sent at 04/04/2018  2:15 PM EDT ----- Regarding: constipation pls call her: recommend Miralax daily, sennokot 2 tabs three times a day and add magnesium citrate. If she felt stool close to the rectum, use Dulcolax suppository. Please call her tomorrow to follow

## 2018-04-05 NOTE — Telephone Encounter (Signed)
She called and left a message to call her.  Called back. She took her Miralax this morning and is taking Sennokot 2 tabs 3 x day. She has is now having diarrhea, x 3 episodes so far today. Instructed to continue Miralax daily and stop Sennokot and to resume Sennokot for constipation. She verbalized understanding. Instructed to call office if needed. She verbalized understanding.  Above message given to Dr. Alvy Bimler.

## 2018-04-12 ENCOUNTER — Telehealth: Payer: Self-pay | Admitting: *Deleted

## 2018-04-12 NOTE — Telephone Encounter (Signed)
Telephone call received from [facility]. She met with patient to discuss referral to [facility]. During the discussion for Goals of Care. Patient has decided to continue with [facility]. MD notified. [facility]ospice with assume as attending.

## 2018-04-17 ENCOUNTER — Telehealth: Payer: Self-pay

## 2018-04-17 NOTE — Telephone Encounter (Signed)
Colleen Mckinney with [facility] called and left a message. Colleen Mckinney informed [place] that she no longer needs [facility]. She will continue with [facility]iative Care services.

## 2018-05-03 ENCOUNTER — Telehealth: Payer: Self-pay | Admitting: Oncology

## 2018-05-03 ENCOUNTER — Encounter: Payer: Self-pay | Admitting: Oncology

## 2018-05-03 NOTE — Telephone Encounter (Signed)
Called Colleen Mckinney to see if she wants to keep the lab appointment on 11/4 and follow up with Dr. Denman George on 11/6.  She said she has hospice now and is too weak to travel for the appointment.  Advised her that we will cancel the appointments and to call if she needs anything.

## 2018-05-06 ENCOUNTER — Other Ambulatory Visit: Payer: Medicare Other

## 2018-05-08 ENCOUNTER — Ambulatory Visit: Payer: Medicare Other | Admitting: Gynecologic Oncology

## 2018-06-02 DEATH — deceased

## 2018-10-31 IMAGING — US US PARACENTESIS
1 series · 5 of 5 positions shown · non-contrast
Comparison: none

INDICATION: Patient with history of left ovarian epithelial cancer and
peritoneal carcinomatosis. Is made for therapeutic paracentesis.

[Series 1: us paracentesis · 5 of 5 slices shown]
[im 1/5]
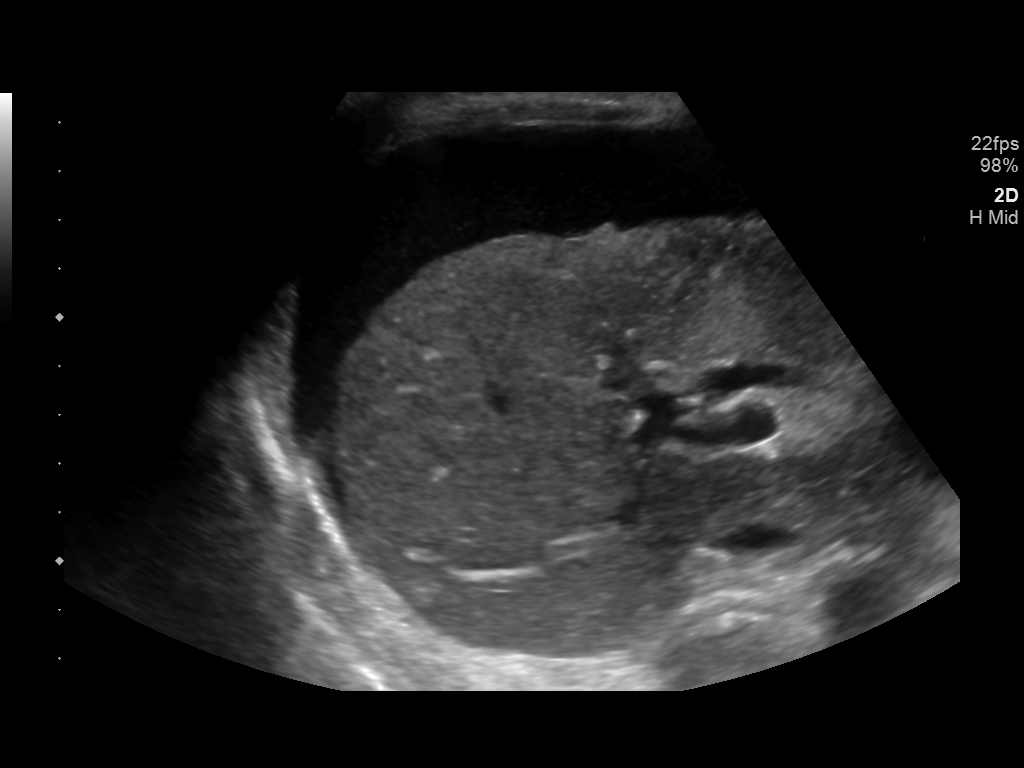
[im 2/5]
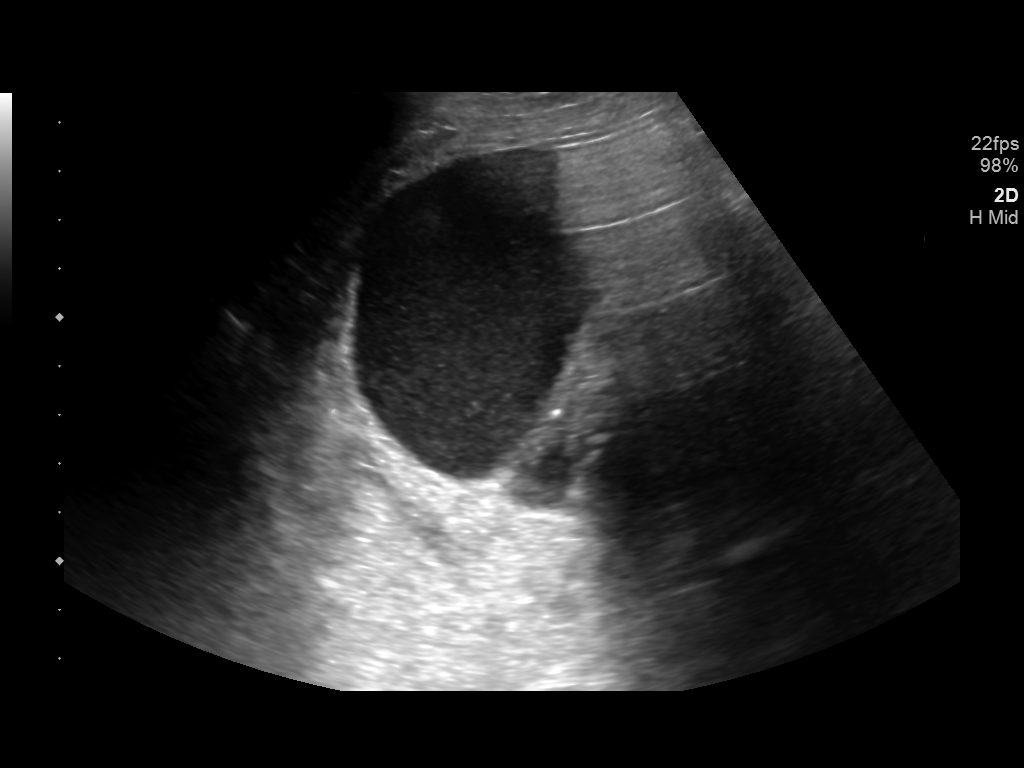
[im 3/5]
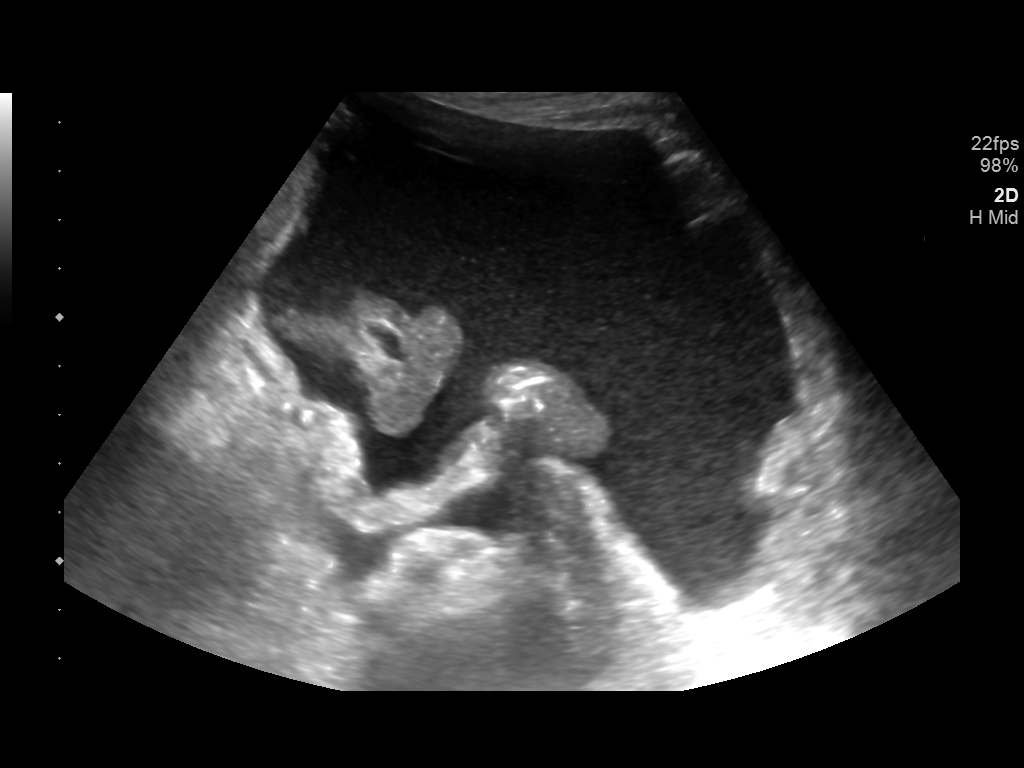
[im 4/5]
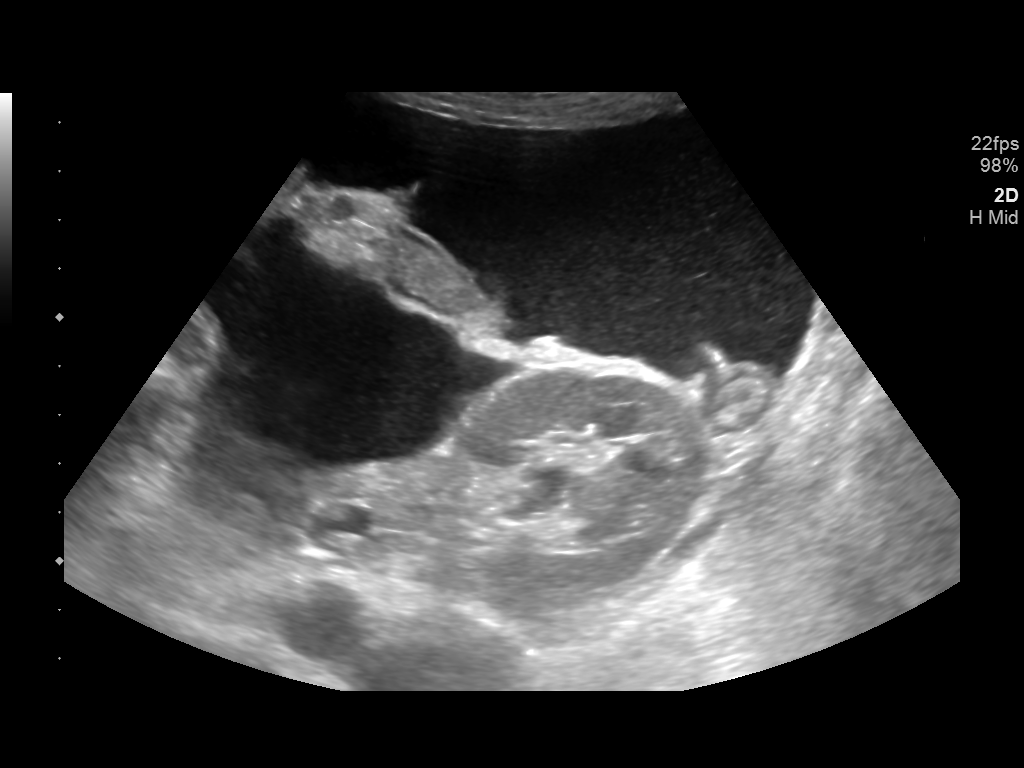
[im 5/5]
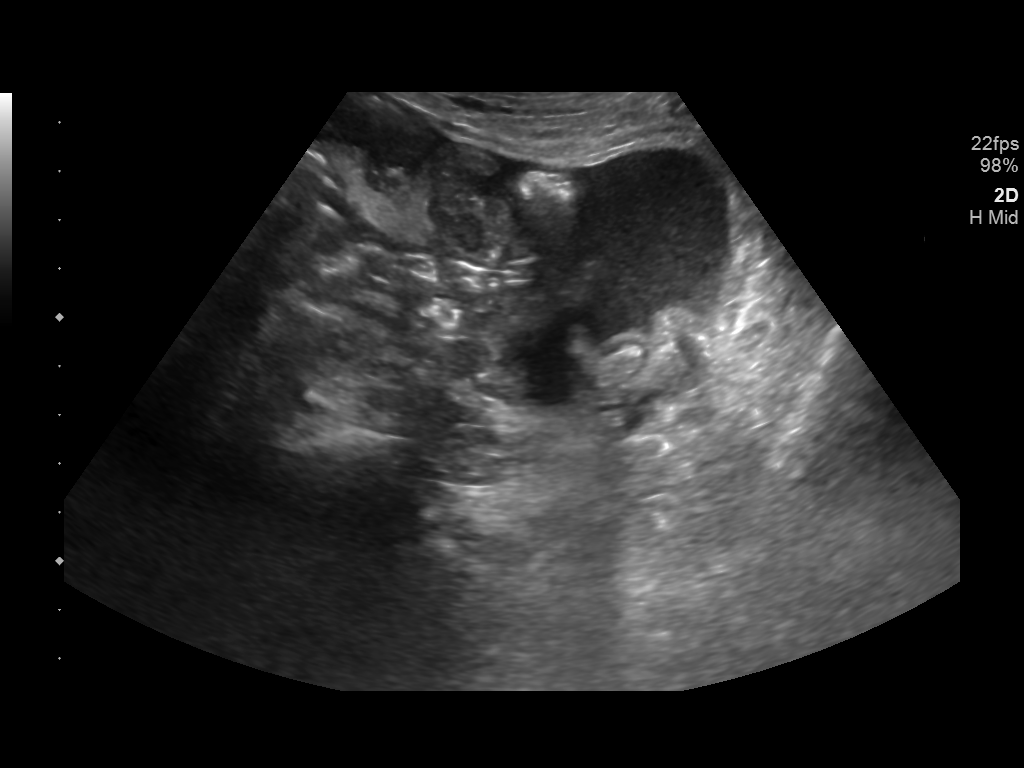

[5 of 5 positions shown; findings below may reference images not displayed]

EXAM:
ULTRASOUND GUIDED THERAPEUTIC PARACENTESIS

MEDICATIONS:
10 mL of 1% lidocaine

COMPLICATIONS:
None immediate.

PROCEDURE:
Informed written consent was obtained from the patient after a
discussion of the risks, benefits and alternatives to treatment. A
timeout was performed prior to the initiation of the procedure.

Initial ultrasound scanning demonstrates a large amount of ascites
within the left lower abdominal quadrant. The left lower abdomen was
prepped and draped in the usual sterile fashion. 1% lidocaine was
used for local anesthesia.

Following this, a 19 gauge, 7-cm, Yueh catheter was introduced. An
ultrasound image was saved for documentation purposes. The
paracentesis was performed. The catheter was removed and a dressing
was applied. The patient tolerated the procedure well without
immediate post procedural complication.
FINDINGS: A total of approximately 3.1 L of blood-tinged fluid was removed.
IMPRESSION: Successful ultrasound-guided paracentesis yielding 3.1 L of
peritoneal fluid.
